# Patient Record
Sex: Male | Born: 1965 | Race: Black or African American | Hispanic: No | Marital: Married | Smoking: Current every day smoker
Health system: Southern US, Community
[De-identification: ages and names within clinical notes are randomized; demographics above are authoritative.]

## PROBLEM LIST (undated history)

## (undated) DIAGNOSIS — I10 Essential (primary) hypertension: Secondary | ICD-10-CM

## (undated) DIAGNOSIS — M1990 Unspecified osteoarthritis, unspecified site: Secondary | ICD-10-CM

## (undated) DIAGNOSIS — T7840XA Allergy, unspecified, initial encounter: Secondary | ICD-10-CM

## (undated) HISTORY — PX: COLON SURGERY: SHX602

## (undated) HISTORY — DX: Allergy, unspecified, initial encounter: T78.40XA

## (undated) HISTORY — DX: Essential (primary) hypertension: I10

## (undated) HISTORY — DX: Unspecified osteoarthritis, unspecified site: M19.90

## (undated) HISTORY — PX: SPINE SURGERY: SHX786

---

## 2017-12-22 DIAGNOSIS — F17210 Nicotine dependence, cigarettes, uncomplicated: Secondary | ICD-10-CM | POA: Insufficient documentation

## 2017-12-22 DIAGNOSIS — R030 Elevated blood-pressure reading, without diagnosis of hypertension: Secondary | ICD-10-CM | POA: Insufficient documentation

## 2017-12-22 DIAGNOSIS — Z981 Arthrodesis status: Secondary | ICD-10-CM | POA: Insufficient documentation

## 2018-01-18 DIAGNOSIS — R29898 Other symptoms and signs involving the musculoskeletal system: Secondary | ICD-10-CM | POA: Insufficient documentation

## 2020-12-09 ENCOUNTER — Encounter: Payer: Self-pay | Admitting: Emergency Medicine

## 2020-12-09 ENCOUNTER — Emergency Department: Payer: Worker's Compensation

## 2020-12-09 ENCOUNTER — Emergency Department
Admission: EM | Admit: 2020-12-09 | Discharge: 2020-12-09 | Disposition: A | Discharge status: Home / Self Care | Payer: Worker's Compensation | Attending: Emergency Medicine | Admitting: Emergency Medicine

## 2020-12-09 ENCOUNTER — Other Ambulatory Visit: Payer: Self-pay

## 2020-12-09 DIAGNOSIS — I10 Essential (primary) hypertension: Secondary | ICD-10-CM | POA: Insufficient documentation

## 2020-12-09 DIAGNOSIS — W19XXXA Unspecified fall, initial encounter: Secondary | ICD-10-CM

## 2020-12-09 DIAGNOSIS — W000XXA Fall on same level due to ice and snow, initial encounter: Secondary | ICD-10-CM | POA: Insufficient documentation

## 2020-12-09 DIAGNOSIS — Y990 Civilian activity done for income or pay: Secondary | ICD-10-CM | POA: Insufficient documentation

## 2020-12-09 DIAGNOSIS — F17210 Nicotine dependence, cigarettes, uncomplicated: Secondary | ICD-10-CM | POA: Insufficient documentation

## 2020-12-09 DIAGNOSIS — S060X9A Concussion with loss of consciousness of unspecified duration, initial encounter: Secondary | ICD-10-CM | POA: Insufficient documentation

## 2020-12-09 IMAGING — CT CT HEAD W/O CM
3 series · 15 of 47 positions shown, 18 images · non-contrast
Comparison: None.

CLINICAL DATA: Neck trauma in a 54-year-old male, fall while at
work and slipped and falling backwards, struck back of head.

EXAM:
CT HEAD WITHOUT CONTRAST
CT CERVICAL SPINE WITHOUT CONTRAST
TECHNIQUE: Multidetector CT imaging of the head and cervical spine was
performed following the standard protocol without intravenous
contrast. Multiplanar CT image reconstructions of the cervical spine
were also generated.

[Series 3: head wo · axial · 0.44mm/px · z∈[-168,-43]mm · 9 of 31 slices shown, 12 images]
[im 3/31  brain]
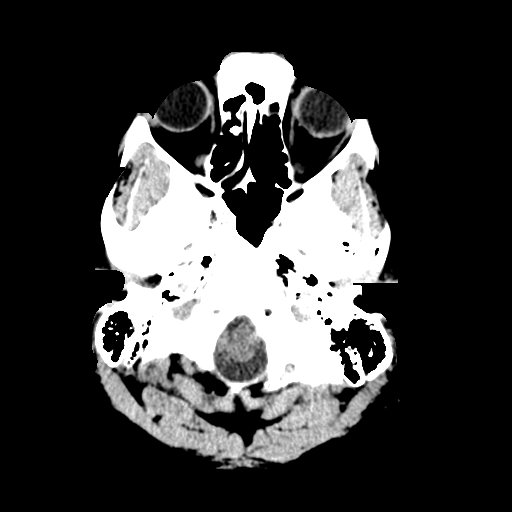
[im 3/31  bone]
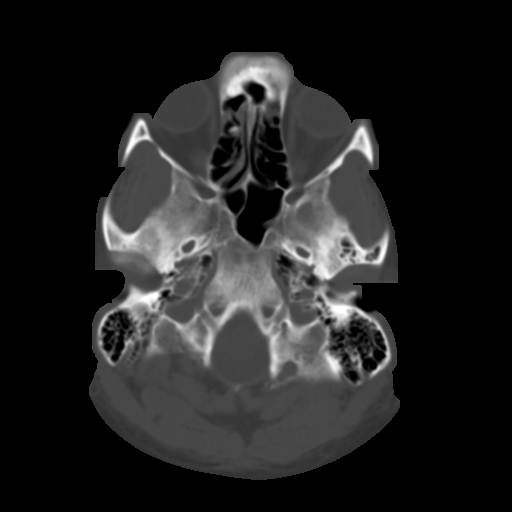
[im 6/31  brain]
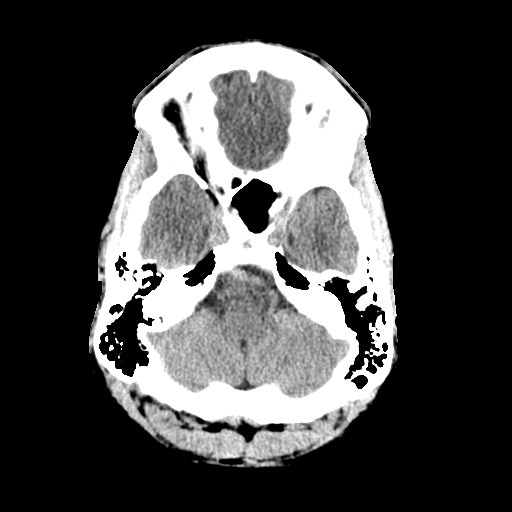
[im 9/31  brain]
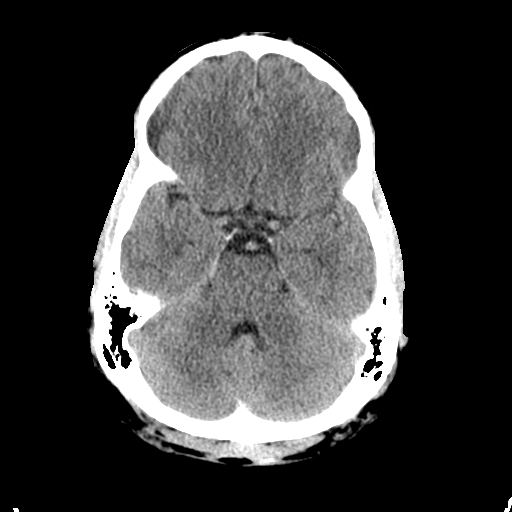
[im 12/31  brain]
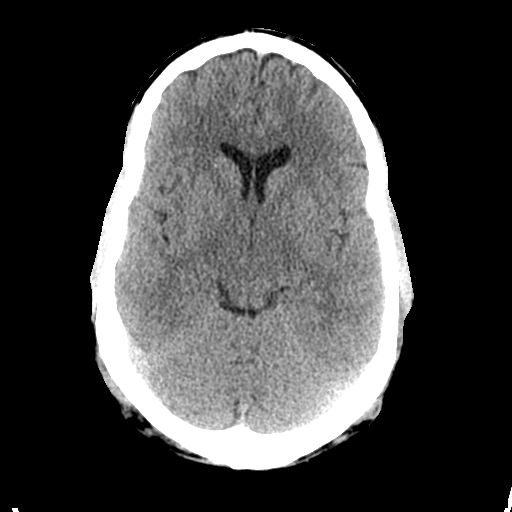
[im 16/31  brain]
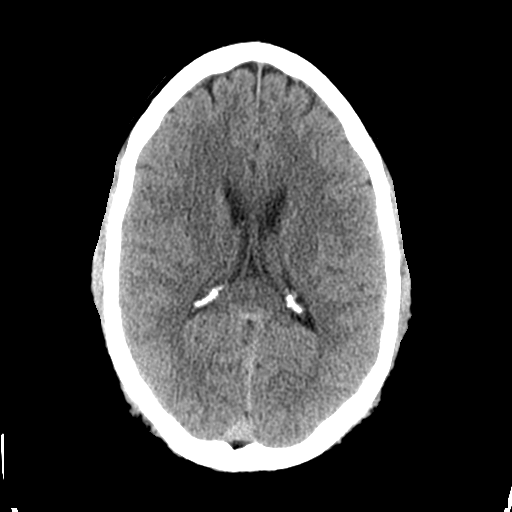
[im 16/31  bone]
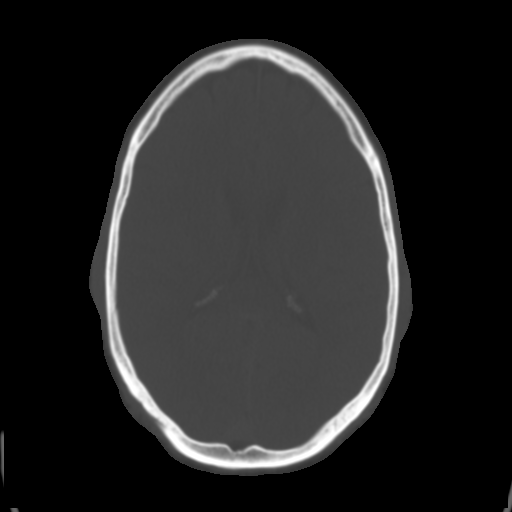
[im 19/31  brain]
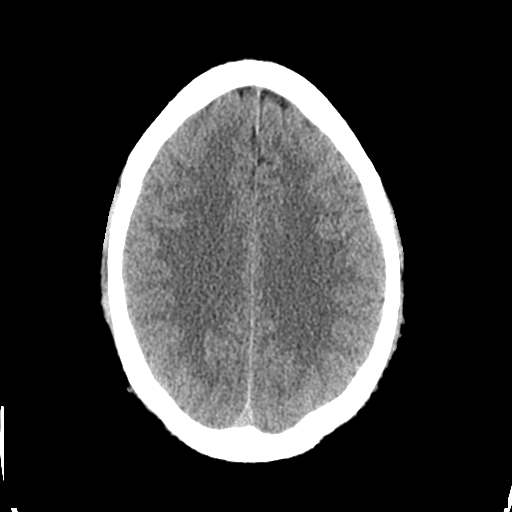
[im 22/31  brain]
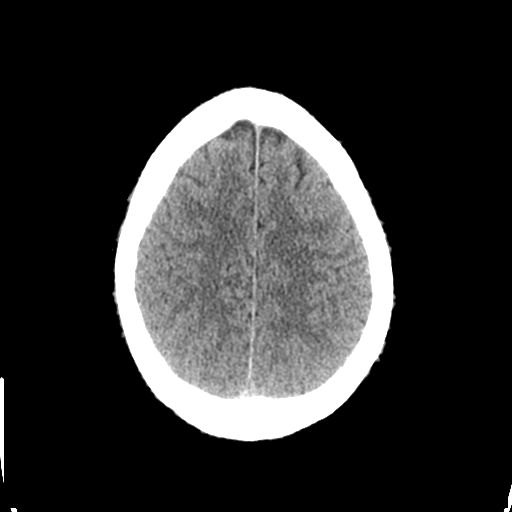
[im 25/31  brain]
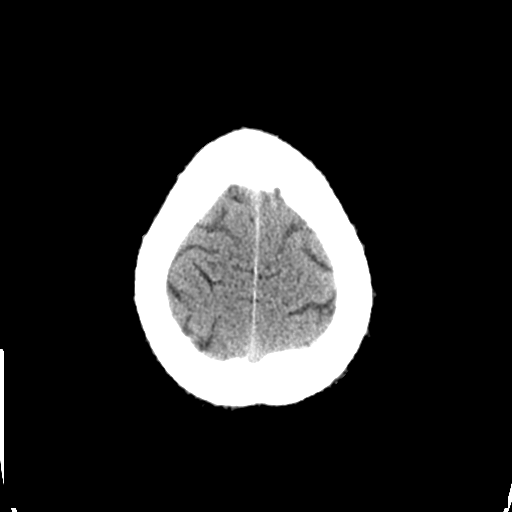
[im 28/31  brain]
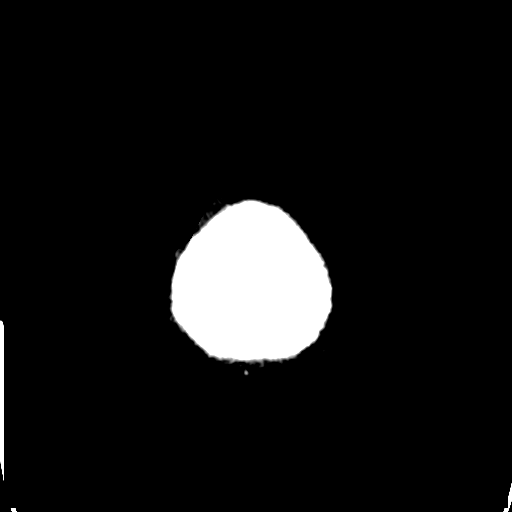
[im 28/31  bone]
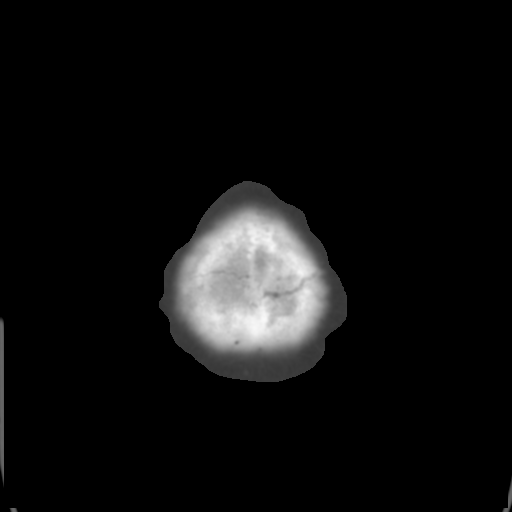

[Series 4: coronal soft tissue · coronal · 0.31mm/px · 3 of 67 slices shown]
[im 23/67  brain]
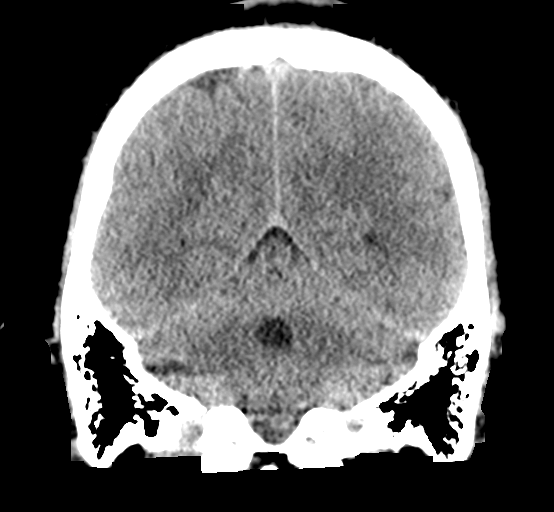
[im 30/67  brain]
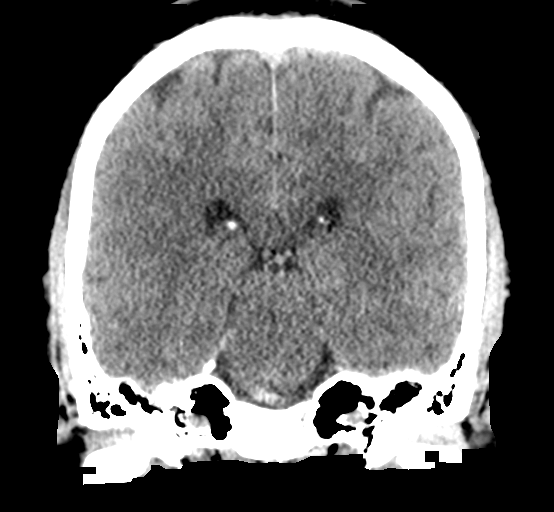
[im 37/67  brain]
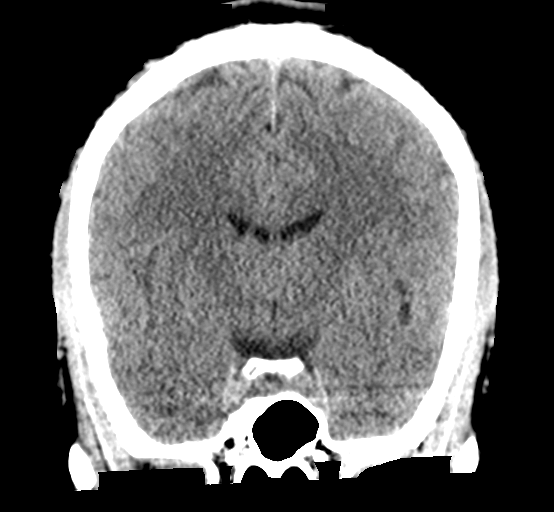

[Series 5: sagittal soft tissue · sagittal · 0.31mm/px · 3 of 51 slices shown]
[im 17/51  brain]
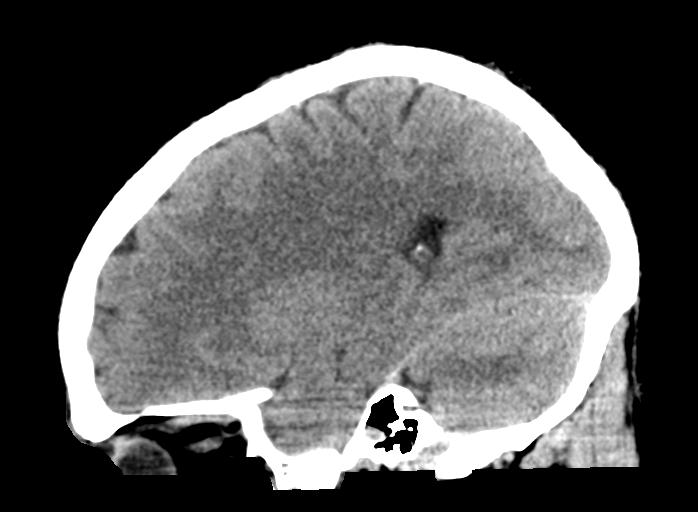
[im 26/51  brain]
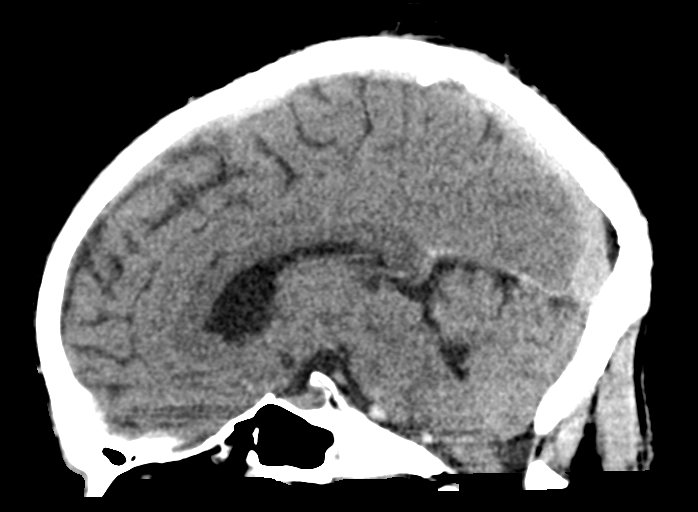
[im 34/51  brain]
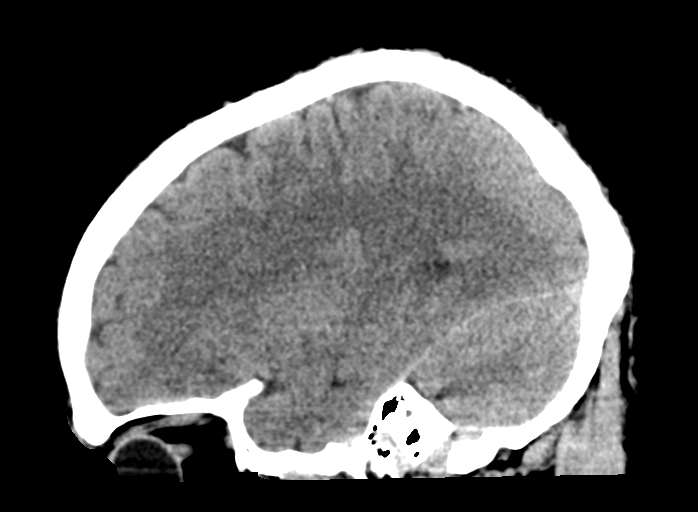

[15 of 47 positions shown; findings below may reference images not displayed]

FINDINGS: CT HEAD FINDINGS

Brain: No evidence of acute infarction, hemorrhage, hydrocephalus,
extra-axial collection or mass lesion/mass effect.

Vascular: No hyperdense vessel or unexpected calcification.

Skull: Normal. Negative for fracture or focal lesion.

Sinuses/Orbits: Visualized paranasal sinuses and orbits are
unremarkable.

Other: None.

CT CERVICAL SPINE FINDINGS

Alignment: Straightening of normal cervical lordotic curvature
likely positional.

Skull base and vertebrae: No acute fracture. No primary bone lesion
or focal pathologic process.

Soft tissues and spinal canal: No prevertebral fluid or swelling. No
visible canal hematoma.

Disc levels: Multilevel degenerative change. Greatest in the mid
cervical spine. Postoperative changes of fusion at C5-6 with
degenerative changes greatest at C3-4, C4-5 and C6-7. Uncovertebral
spurring causes neural foraminal narrowing which is greatest at
C6-7.

Upper chest: Negative.

Other: None
IMPRESSION: 1. No acute intracranial abnormality.
2. No evidence for acute fracture or subluxation of the cervical
spine.
3. Multilevel degenerative change and postoperative fusion of the
cervical spine, as described.

## 2020-12-09 IMAGING — CT CT CERVICAL SPINE W/O CM
3 of 4 series · 12 of 33 positions shown, 14 images · non-contrast
Comparison: None.

CLINICAL DATA: Neck trauma in a 54-year-old male, fall while at
work and slipped and falling backwards, struck back of head.

EXAM:
CT HEAD WITHOUT CONTRAST
CT CERVICAL SPINE WITHOUT CONTRAST
TECHNIQUE: Multidetector CT imaging of the head and cervical spine was
performed following the standard protocol without intravenous
contrast. Multiplanar CT image reconstructions of the cervical spine
were also generated.

[Series 4: sagittal bone · sagittal · 0.26mm/px · 5 of 73 slices shown, 6 images]
[im 25/73  bone]
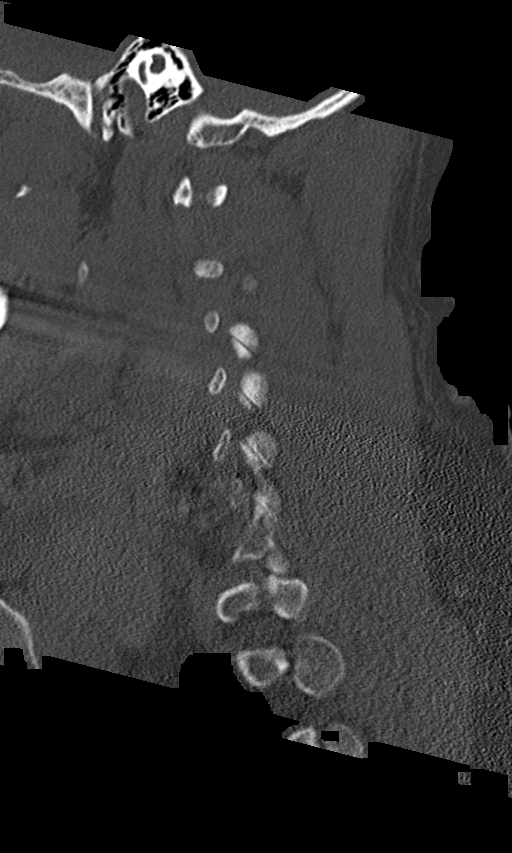
[im 31/73  bone]
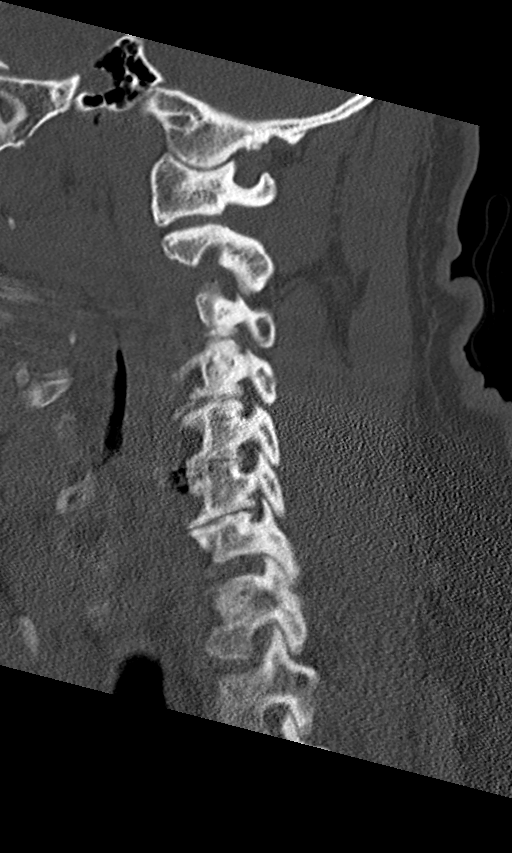
[im 37/73  soft-tissue]
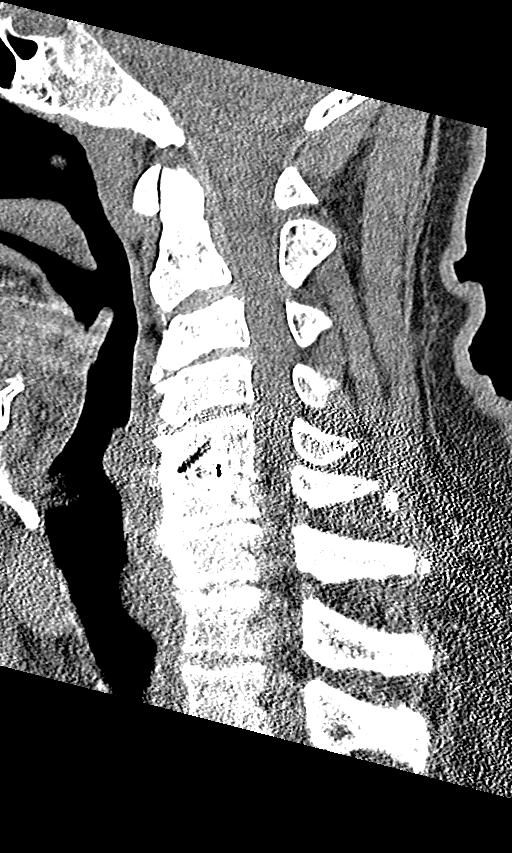
[im 37/73  bone]
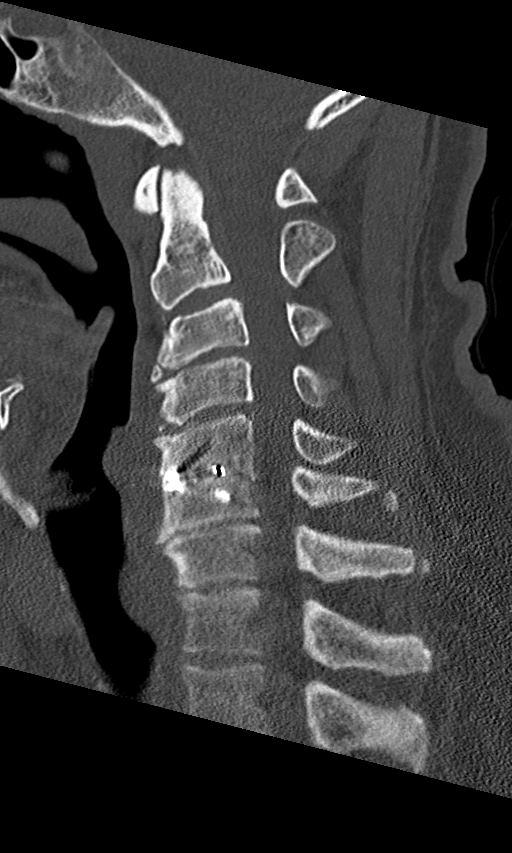
[im 43/73  bone]
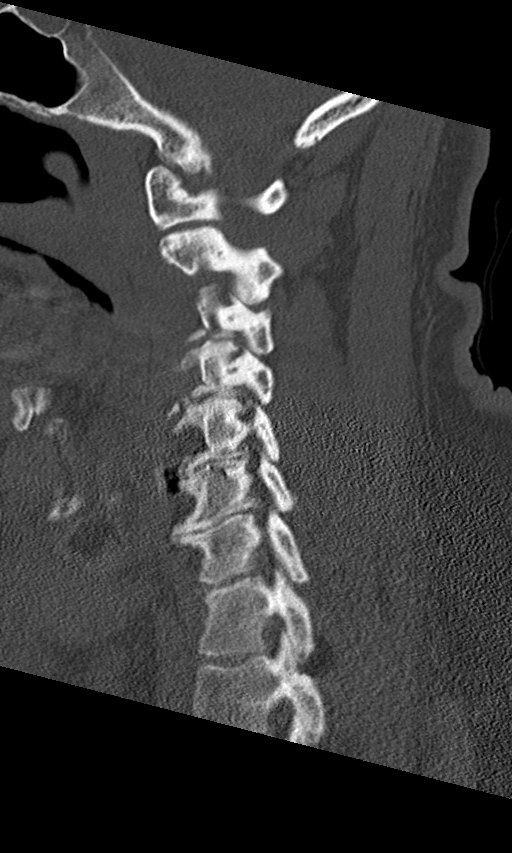
[im 49/73  bone]
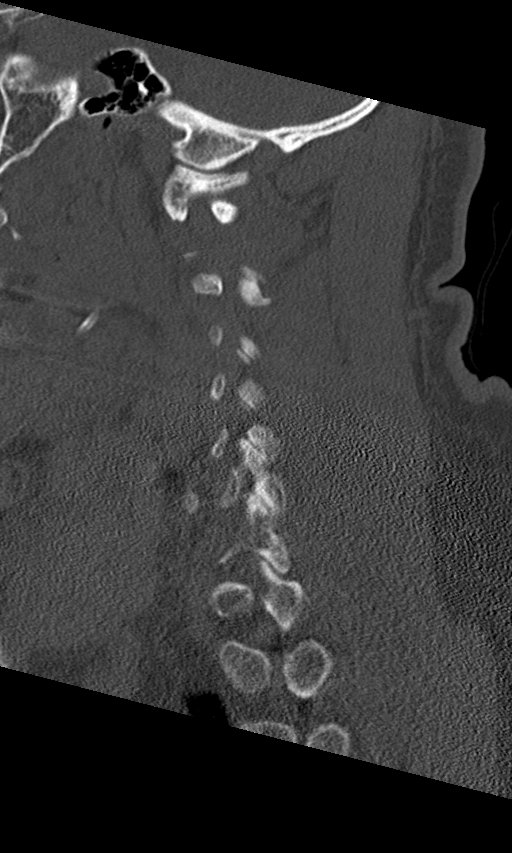

[Series 5: coronal bone · coronal · 0.29mm/px · 3 of 71 slices shown]
[im 15/71  bone]
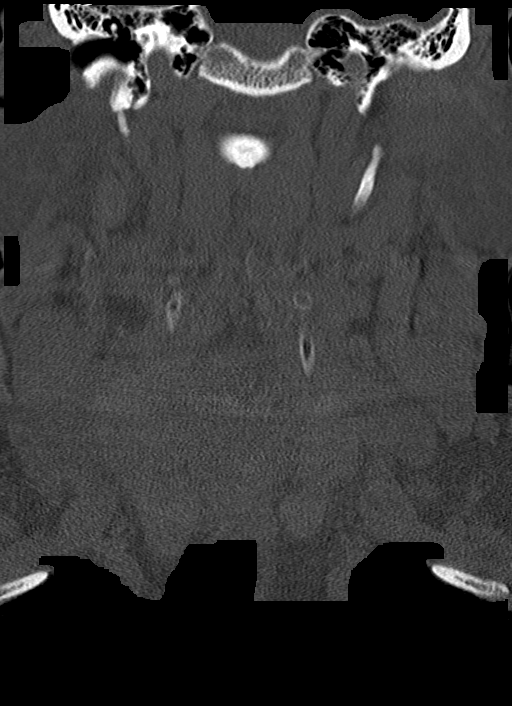
[im 29/71  bone]
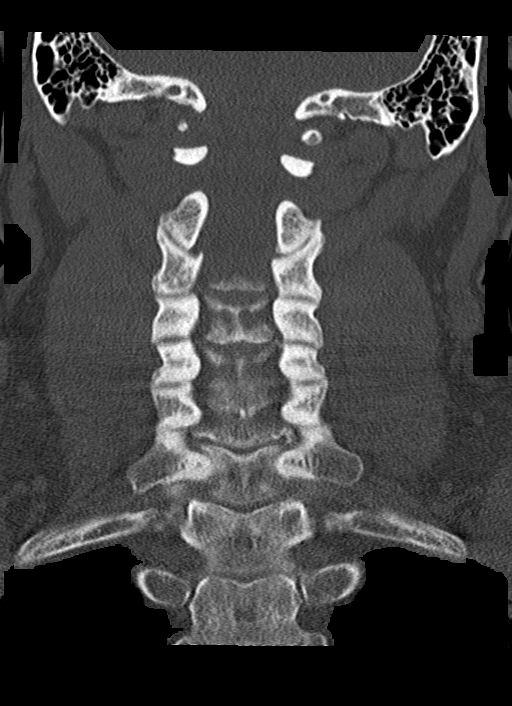
[im 43/71  bone]
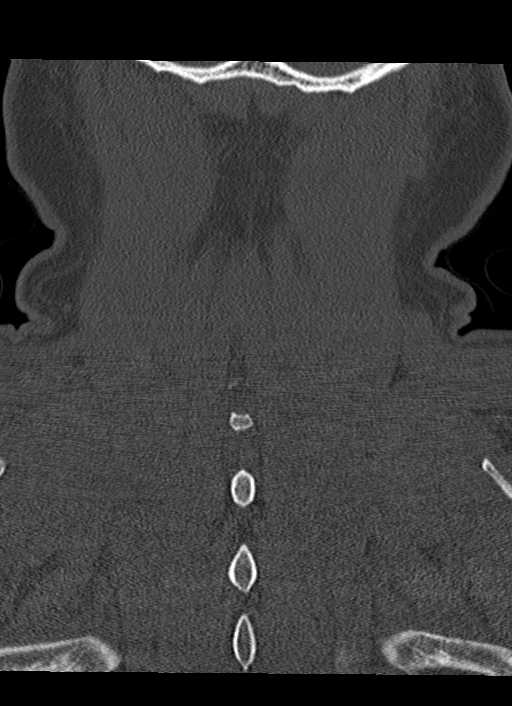

[Series 6: orthogonal bone · axial · 0.29mm/px · z∈[-317,-205]mm · 4 of 91 slices shown, 5 images]
[im 16/91  soft-tissue]
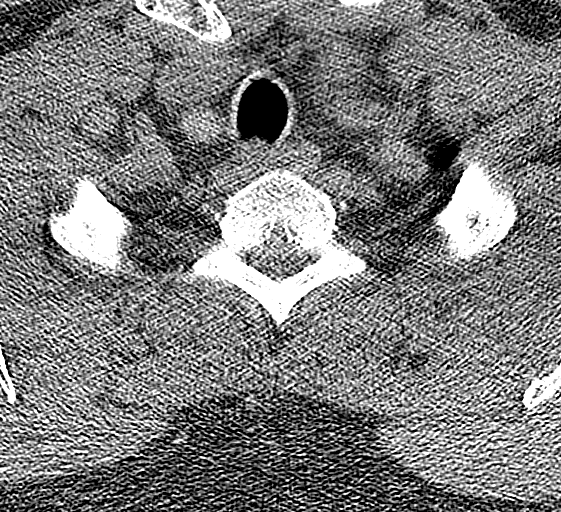
[im 16/91  bone]
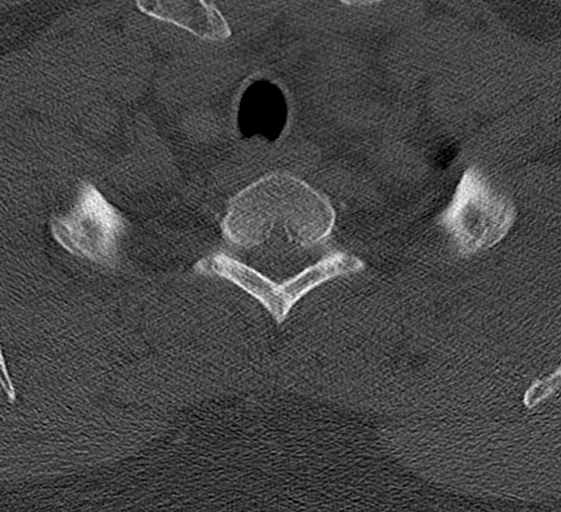
[im 31/91  bone]
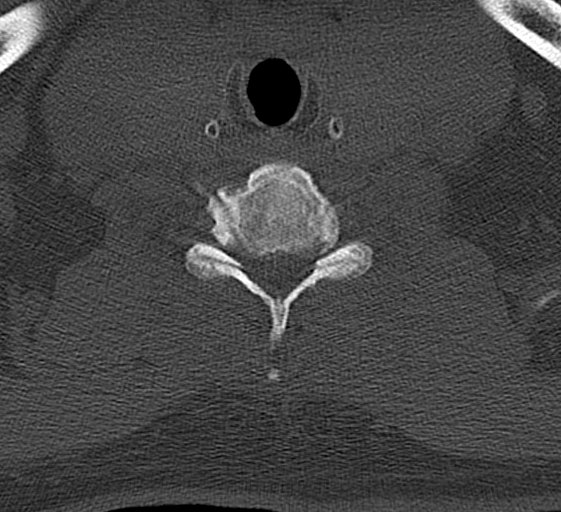
[im 61/91  bone]
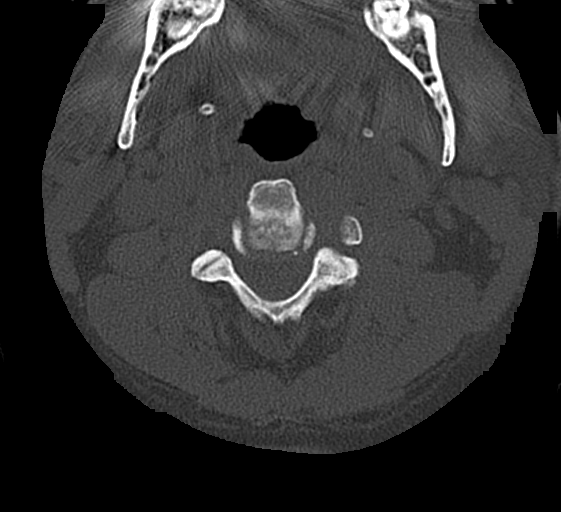
[im 76/91  bone]
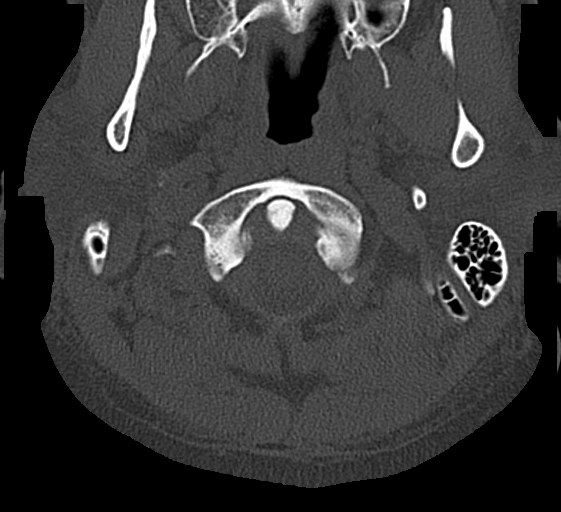

[12 of 33 positions shown; findings below may reference images not displayed]

FINDINGS: CT HEAD FINDINGS

Brain: No evidence of acute infarction, hemorrhage, hydrocephalus,
extra-axial collection or mass lesion/mass effect.

Vascular: No hyperdense vessel or unexpected calcification.

Skull: Normal. Negative for fracture or focal lesion.

Sinuses/Orbits: Visualized paranasal sinuses and orbits are
unremarkable.

Other: None.

CT CERVICAL SPINE FINDINGS

Alignment: Straightening of normal cervical lordotic curvature
likely positional.

Skull base and vertebrae: No acute fracture. No primary bone lesion
or focal pathologic process.

Soft tissues and spinal canal: No prevertebral fluid or swelling. No
visible canal hematoma.

Disc levels: Multilevel degenerative change. Greatest in the mid
cervical spine. Postoperative changes of fusion at C5-6 with
degenerative changes greatest at C3-4, C4-5 and C6-7. Uncovertebral
spurring causes neural foraminal narrowing which is greatest at
C6-7.

Upper chest: Negative.

Other: None
IMPRESSION: 1. No acute intracranial abnormality.
2. No evidence for acute fracture or subluxation of the cervical
spine.
3. Multilevel degenerative change and postoperative fusion of the
cervical spine, as described.

## 2020-12-09 NOTE — Discharge Instructions (Signed)
Please control follow-up appointment with primary care physician for blood pressure recheck in 5 to 7 days.

## 2020-12-09 NOTE — ED Provider Notes (Signed)
[facility] EMERGENCY DEPARTMENT Provider Note   CSN: [id number] Arrival date & time: 12/09/20  1206     History Chief Complaint  Patient presents with  . Fall    Tyler Winters is a 55 y.o. male.  HPI   Patient is a 55 year old male without significant past medical history aside from remote neck surgery several years ago causing chronic weakness in his right shoulder and right arm who presents for assessment after 2 falls that occurred earlier today.  Patient states this time he was walking and slipped on ice falling backwards hitting his head.  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  He denies any acute pain right now and states otherwise he has been in usual state of health without any other recent falls or injuries, chest pain, cough, shortness of breath, fevers, chills, vomiting, diarrhea, dysuria, rash or other acute sick symptoms.  He does not take any blood thinners and does not take any daily medicines.  He is requesting referral for PCP.   History reviewed. No pertinent past medical history.  There are no problems to display for this patient.    No family history on file.  Social History   Tobacco Use  . Smoking status: Current Every Day Smoker    Types: Cigarettes  . Smokeless tobacco: Never Used    Home Medications Prior to Admission medications   Not on File    Allergies    Morphine and related  Review of Systems   Review of Systems  Constitutional: Negative for chills and fever.  HENT: Negative for ear pain and sore throat.   Eyes: Negative for pain and visual disturbance.  Respiratory: Negative for cough and shortness of breath.   Cardiovascular: Negative for chest pain and palpitations.  Gastrointestinal: Negative for abdominal pain and vomiting.   Genitourinary: Negative for dysuria and hematuria.  Musculoskeletal: Negative for arthralgias and back pain.  Skin: Negative for color change and rash.  Neurological: Positive for weakness ( chronic in RUE from remote neck surgery). Negative for seizures and syncope.  All other systems reviewed and are negative.   Physical Exam Updated Vital Signs BP (!) 177/107 (BP Location: Left Arm)   Pulse 85   Temp 98.3 F (36.8 C) (Oral)   Resp 16   Ht 5\' 6"  (1.676 m)   Wt 89.4 kg   SpO2 97%   BMI 31.80 kg/m   Physical Exam Vitals and nursing note reviewed.  Constitutional:      Appearance: He is well-developed and well-nourished.  HENT:     Head: Normocephalic and atraumatic.     Right Ear: External ear normal.     Left Ear: External ear normal.     Nose: Nose normal.     Mouth/Throat:     Mouth: Mucous membranes are moist.  Eyes:     Conjunctiva/sclera: Conjunctivae normal.  Cardiovascular:     Rate and Rhythm: Normal rate and regular rhythm.     Heart sounds: No murmur heard.   Pulmonary:     Effort: Pulmonary effort is normal. No respiratory distress.     Breath sounds: Normal breath sounds.  Abdominal:     Palpations: Abdomen is soft.     Tenderness: There is no abdominal tenderness.  Musculoskeletal:  General: No edema.     Cervical back: Neck supple.  Skin:    General: Skin is warm and dry.     Capillary Refill: Capillary refill takes less than 2 seconds.  Neurological:     Mental Status: He is alert and oriented to person, place, and time.  Psychiatric:        Mood and Affect: Mood and affect and mood normal.    No pronator drift.  No finger dysmetria.  Cranial nerves II to XII grossly intact.  With exception of some weakness in the right arm on flexion extension at the right elbow as well as abduction of the right Shoulder patient has full and symmetric strength in all extremities.    No tenderness step-offs or deformities over the C/T/L-spine.  2+  bilateral radial pulse.   ED Results / Procedures / Treatments   Labs (all labs ordered are listed, but only abnormal results are displayed) Labs Reviewed - No data to display  EKG None  Radiology CT Head Wo Contrast  Result Date: 12/09/2020 CLINICAL DATA:  Neck trauma in a 55 year old male, fall while at work and slipped and falling backwards, struck back of head. EXAM: CT HEAD WITHOUT CONTRAST CT CERVICAL SPINE WITHOUT CONTRAST TECHNIQUE: Multidetector CT imaging of the head and cervical spine was performed following the standard protocol without intravenous contrast. Multiplanar CT image reconstructions of the cervical spine were also generated. COMPARISON:  None. FINDINGS: CT HEAD FINDINGS Brain: No evidence of acute infarction, hemorrhage, hydrocephalus, extra-axial collection or mass lesion/mass effect. Vascular: No hyperdense vessel or unexpected calcification. Skull: Normal. Negative for fracture or focal lesion. Sinuses/Orbits: Visualized paranasal sinuses and orbits are unremarkable. Other: None. CT CERVICAL SPINE FINDINGS Alignment: Straightening of normal cervical lordotic curvature likely positional. Skull base and vertebrae: No acute fracture. No primary bone lesion or focal pathologic process. Soft tissues and spinal canal: No prevertebral fluid or swelling. No visible canal hematoma. Disc levels: Multilevel degenerative change. Greatest in the mid cervical spine. Postoperative changes of fusion at C5-6 with degenerative changes greatest at C3-4, C4-5 and C6-7. Uncovertebral spurring causes neural foraminal narrowing which is greatest at C6-7. Upper chest: Negative. Other: None IMPRESSION: 1. No acute intracranial abnormality. 2. No evidence for acute fracture or subluxation of the cervical spine. 3. Multilevel degenerative change and postoperative fusion of the cervical spine, as described. Electronically Signed   By: Donzetta Kohut M.D.   On: 12/09/2020 13:26   CT Cervical Spine Wo  Contrast  Result Date: 12/09/2020 CLINICAL DATA:  Neck trauma in a 55 year old male, fall while at work and slipped and falling backwards, struck back of head. EXAM: CT HEAD WITHOUT CONTRAST CT CERVICAL SPINE WITHOUT CONTRAST TECHNIQUE: Multidetector CT imaging of the head and cervical spine was performed following the standard protocol without intravenous contrast. Multiplanar CT image reconstructions of the cervical spine were also generated. COMPARISON:  None. FINDINGS: CT HEAD FINDINGS Brain: No evidence of acute infarction, hemorrhage, hydrocephalus, extra-axial collection or mass lesion/mass effect. Vascular: No hyperdense vessel or unexpected calcification. Skull: Normal. Negative for fracture or focal lesion. Sinuses/Orbits: Visualized paranasal sinuses and orbits are unremarkable. Other: None. CT CERVICAL SPINE FINDINGS Alignment: Straightening of normal cervical lordotic curvature likely positional. Skull base and vertebrae: No acute fracture. No primary bone lesion or focal pathologic process. Soft tissues and spinal canal: No prevertebral fluid or swelling. No visible canal hematoma. Disc levels: Multilevel degenerative change. Greatest in the mid cervical spine. Postoperative changes of fusion at C5-6 with degenerative  changes greatest at C3-4, C4-5 and C6-7. Uncovertebral spurring causes neural foraminal narrowing which is greatest at C6-7. Upper chest: Negative. Other: None IMPRESSION: 1. No acute intracranial abnormality. 2. No evidence for acute fracture or subluxation of the cervical spine. 3. Multilevel degenerative change and postoperative fusion of the cervical spine, as described. Electronically Signed   By: Donzetta Kohut M.D.   On: 12/09/2020 13:26    Procedures Procedures (including critical care time)  Medications Ordered in ED Medications - No data to display  ED Course  I have reviewed the triage vital signs and the nursing notes.  Pertinent labs & imaging results that were  available during my care of the patient were reviewed by me and considered in my medical decision making (see chart for details).    MDM Rules/Calculators/A&P                          Patient presents with post history exam for assessment after 2 falls that occurred after he slipped on some ice earlier today.  He also states he has some memory loss after the second fall is not sure how he got back inside although denies any acute symptoms.  On arrival he is hypertensive with BP of 177/107 otherwise stable vital signs on room air.  He denies any known history of hypertension.  On exam patient is neurovascularly intact in all extremities with exception of some weakness in his right upper extremity which patient states is chronic.  No other neurodeficits or other signs of trauma to the patient's face, scalp, chest, abdomen, back or otherwise.  CT head and C-spine are unremarkable for evidence of fracture dislocation or intracranial large.  Given patient is asymptomatic with otherwise reassuring work-up and exam I believe he is safe for discharge plan for outpatient follow-up.  Advised patient that his blood pressure was elevated today and should be rechecked in 5 to 7 days.  Advised to establish PCP care and referral was placed.  Discharged stable condition.  Strict return precautions advised and discussed.     Final Clinical Impression(s) / ED Diagnoses Final diagnoses:  Fall, initial encounter  Concussion with loss of consciousness, initial encounter  Hypertension, unspecified type    Rx / DC Orders ED Discharge Orders    None       Gilles Chiquito, MD 12/09/20 [phone]

## 2020-12-09 NOTE — ED Triage Notes (Signed)
While at work slipped on ice, fell back hit head.  Stood up, slipped again and fell back.  States can't remember how he got back inside.  Also states that 'for a while' he has felt off balance.  Patient is AAOx3.  Skin warm and dry. NAD. MAE equally and strong.    Works for [employer].

## 2021-01-08 ENCOUNTER — Other Ambulatory Visit: Payer: Self-pay

## 2021-01-08 ENCOUNTER — Inpatient Hospital Stay
Admission: EM | Admit: 2021-01-08 | Discharge: 2021-01-11 | DRG: 552 | Disposition: A | Discharge status: Home / Self Care | Payer: Self-pay | Attending: Internal Medicine | Admitting: Internal Medicine

## 2021-01-08 DIAGNOSIS — R3915 Urgency of urination: Secondary | ICD-10-CM | POA: Diagnosis present

## 2021-01-08 DIAGNOSIS — R292 Abnormal reflex: Secondary | ICD-10-CM | POA: Diagnosis present

## 2021-01-08 DIAGNOSIS — F17200 Nicotine dependence, unspecified, uncomplicated: Secondary | ICD-10-CM | POA: Diagnosis present

## 2021-01-08 DIAGNOSIS — Z981 Arthrodesis status: Secondary | ICD-10-CM

## 2021-01-08 DIAGNOSIS — Z8249 Family history of ischemic heart disease and other diseases of the circulatory system: Secondary | ICD-10-CM

## 2021-01-08 DIAGNOSIS — M47812 Spondylosis without myelopathy or radiculopathy, cervical region: Secondary | ICD-10-CM | POA: Diagnosis present

## 2021-01-08 DIAGNOSIS — R279 Unspecified lack of coordination: Secondary | ICD-10-CM | POA: Diagnosis present

## 2021-01-08 DIAGNOSIS — F17210 Nicotine dependence, cigarettes, uncomplicated: Secondary | ICD-10-CM | POA: Diagnosis present

## 2021-01-08 DIAGNOSIS — Z9181 History of falling: Secondary | ICD-10-CM

## 2021-01-08 DIAGNOSIS — Z20822 Contact with and (suspected) exposure to covid-19: Secondary | ICD-10-CM | POA: Diagnosis present

## 2021-01-08 DIAGNOSIS — R29898 Other symptoms and signs involving the musculoskeletal system: Secondary | ICD-10-CM | POA: Diagnosis present

## 2021-01-08 DIAGNOSIS — M4802 Spinal stenosis, cervical region: Secondary | ICD-10-CM | POA: Diagnosis present

## 2021-01-08 DIAGNOSIS — M5116 Intervertebral disc disorders with radiculopathy, lumbar region: Principal | ICD-10-CM | POA: Diagnosis present

## 2021-01-08 DIAGNOSIS — M47814 Spondylosis without myelopathy or radiculopathy, thoracic region: Secondary | ICD-10-CM | POA: Diagnosis present

## 2021-01-08 DIAGNOSIS — R262 Difficulty in walking, not elsewhere classified: Secondary | ICD-10-CM | POA: Diagnosis present

## 2021-01-08 DIAGNOSIS — M48061 Spinal stenosis, lumbar region without neurogenic claudication: Secondary | ICD-10-CM | POA: Diagnosis present

## 2021-01-08 LAB — BASIC METABOLIC PANEL
Anion gap: 8 (ref 5–15)
BUN: 12 mg/dL (ref 6–20)
CO2: 24 mmol/L (ref 22–32)
Calcium: 8.9 mg/dL (ref 8.9–10.3)
Chloride: 109 mmol/L (ref 98–111)
Creatinine, Ser: 0.87 mg/dL (ref 0.61–1.24)
GFR, Estimated: >60 mL/min (ref >60)
Glucose, Bld: 99 mg/dL (ref 70–99)
Potassium: 3.9 mmol/L (ref 3.5–5.1)
Sodium: 141 mmol/L (ref 135–145)

## 2021-01-08 LAB — CBC
HCT: 43.5 % (ref 39.0–52.0)
Hemoglobin: 14.4 g/dL (ref 13.0–17.0)
MCH: 29.7 pg (ref 26.0–34.0)
MCHC: 33.1 g/dL (ref 30.0–36.0)
MCV: 89.7 fL (ref 80.0–100.0)
Platelets: 252 10*3/uL (ref 150–400)
RBC: 4.85 MIL/uL (ref 4.22–5.81)
RDW: 12.9 % (ref 11.5–15.5)
WBC: 4.6 10*3/uL (ref 4.0–10.5)
nRBC: 0 % (ref 0.0–0.2)

## 2021-01-08 NOTE — ED Triage Notes (Signed)
First Nurse: Pt states about a month ago coming in for a fall and being diagnosed with a concussion. Pt states he now has a "pimp" walk to the left leg, with left leg weakness. Pt has no other complaints. Dr. Vicente Males made aware. No orders given for emergent imaging at this time. Pt was able to walk to the First nurse desk.

## 2021-01-08 NOTE — ED Notes (Signed)
Pt ambulatory from waiting room to ED room 6

## 2021-01-08 NOTE — ED Triage Notes (Signed)
Pt states that he had a fall about a month ago and since then he has had gradual numbness of his left leg. Pt is able to walk, with a limp. Pt has no other complaints.  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   Dr. Vicente Males notified and stated pt is okay for flex. Gave verbal for blood work, please see orders

## 2021-01-09 ENCOUNTER — Encounter: Payer: Self-pay | Admitting: Internal Medicine

## 2021-01-09 ENCOUNTER — Emergency Department: Payer: Self-pay

## 2021-01-09 ENCOUNTER — Inpatient Hospital Stay: Payer: Self-pay

## 2021-01-09 DIAGNOSIS — F17200 Nicotine dependence, unspecified, uncomplicated: Secondary | ICD-10-CM | POA: Diagnosis present

## 2021-01-09 DIAGNOSIS — R29898 Other symptoms and signs involving the musculoskeletal system: Secondary | ICD-10-CM | POA: Insufficient documentation

## 2021-01-09 LAB — RESP PANEL BY RT-PCR (FLU A&B, COVID) ARPGX2
Influenza A by PCR: NEGATIVE
Influenza B by PCR: NEGATIVE
SARS Coronavirus 2 by RT PCR: NEGATIVE

## 2021-01-09 IMAGING — MR MR THORACIC SPINE W/O CM
6 series · 30 of 48 positions shown · non-contrast
Comparison: CTs of the cervical, thoracic and lumbar spine same
date.

CLINICAL DATA: Left leg weakness following a fall with loss of
consciousness approximately 4 weeks prior. Unsteady gait. Evaluate
for cord compression and cauda equina syndrome.

EXAM:
MRI CERVICAL, THORACIC AND LUMBAR SPINE WITHOUT CONTRAST
TECHNIQUE: Multiplanar and multiecho pulse sequences of the cervical spine, to
include the craniocervical junction and cervicothoracic junction,
and thoracic and lumbar spine, were obtained without intravenous
contrast.

[Series 18: T1 · sagittal · 6.0mm · 1.41mm/px · 2 of 9 slices shown (1 of 2)]
[im 1/9]
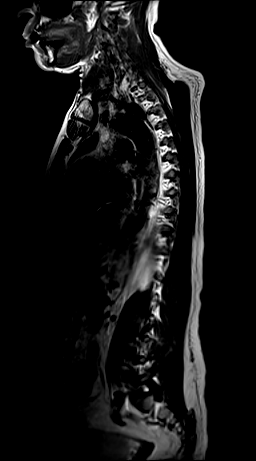
[im 9/9]
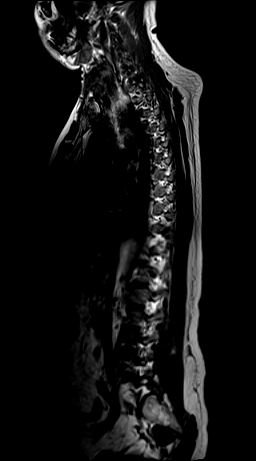

[Series 19: T2 · sagittal · 3.0mm · 1.33mm/px · 6 of 17 slices shown (1 of 2)]
[im 1/17]
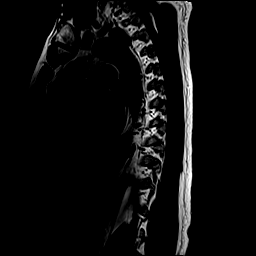
[im 4/17]
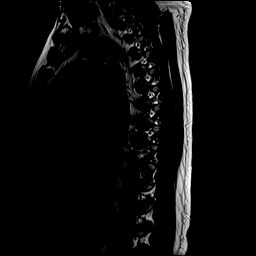
[im 7/17]
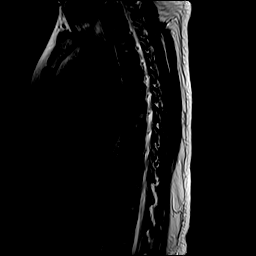
[im 10/17]
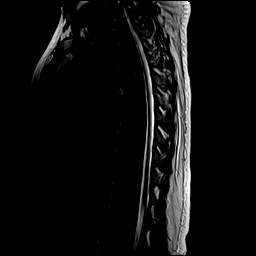
[im 13/17]
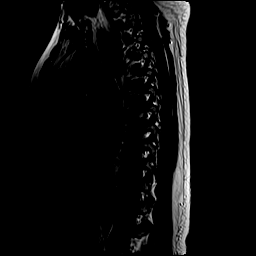
[im 17/17]
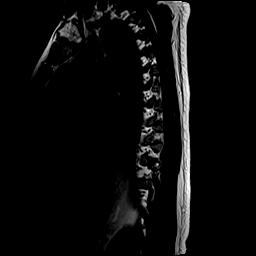

[Series 20: T1 · sagittal · 3.0mm · 1.33mm/px · 6 of 17 slices shown (2 of 2)]
[im 1/17]
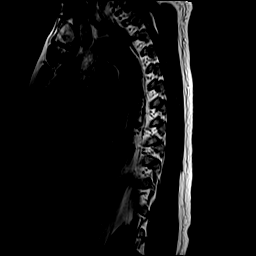
[im 4/17]
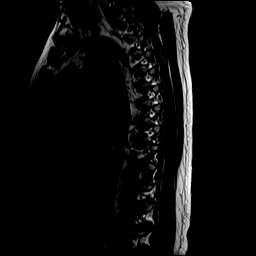
[im 7/17]
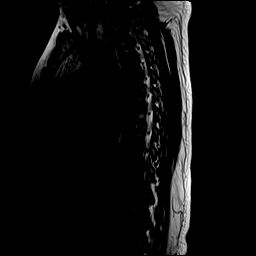
[im 10/17]
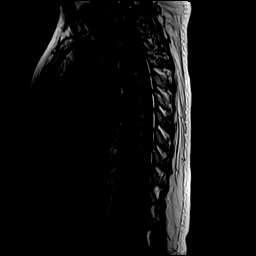
[im 13/17]
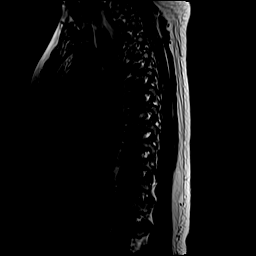
[im 17/17]
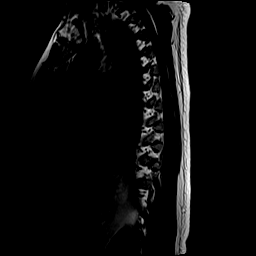

[Series 21: STIR · sagittal · 3.0mm · 0.66mm/px · 6 of 17 slices shown]
[im 1/17]
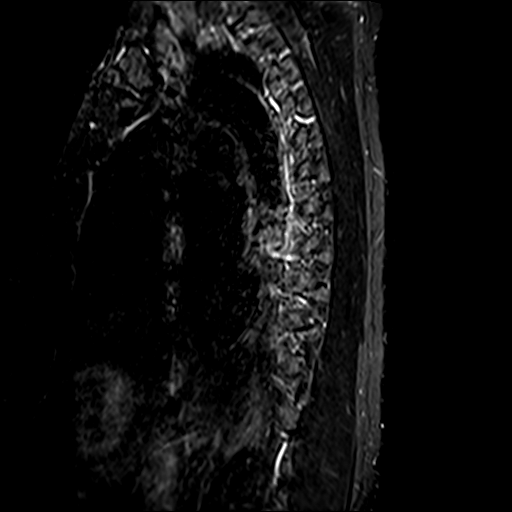
[im 4/17]
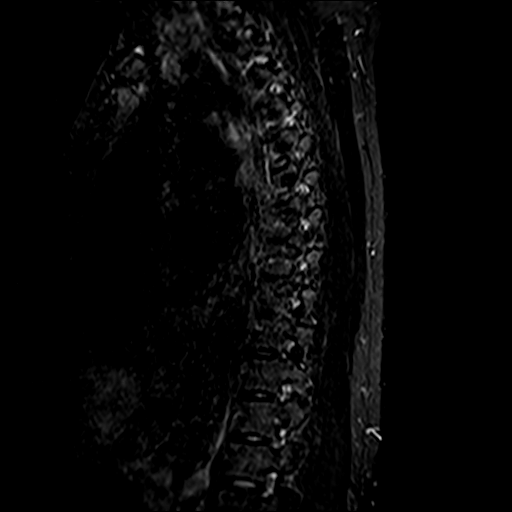
[im 7/17]
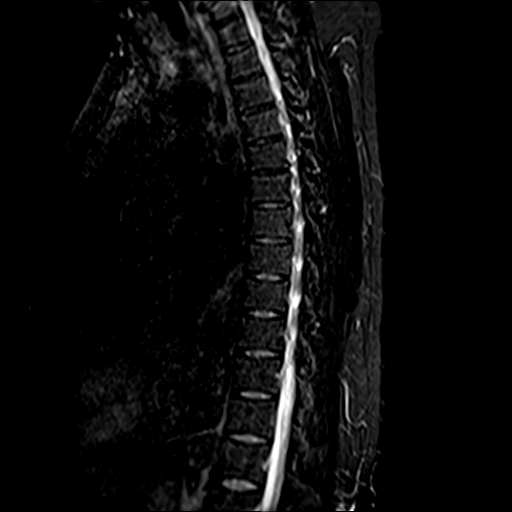
[im 10/17]
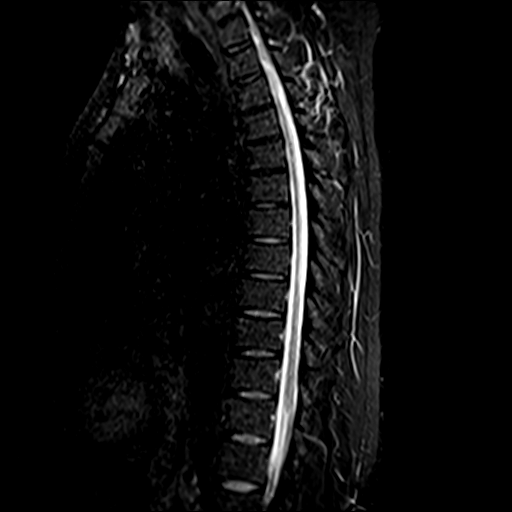
[im 13/17]
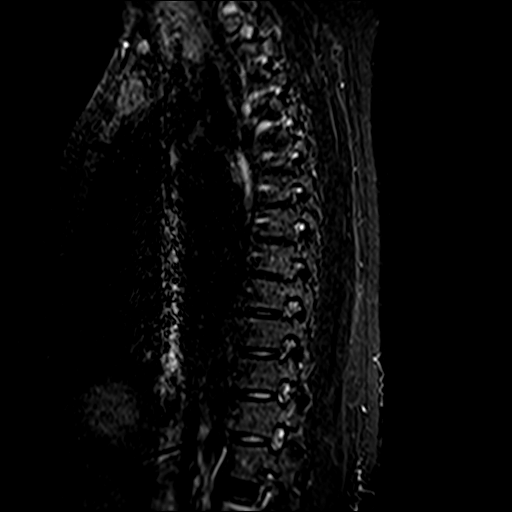
[im 17/17]
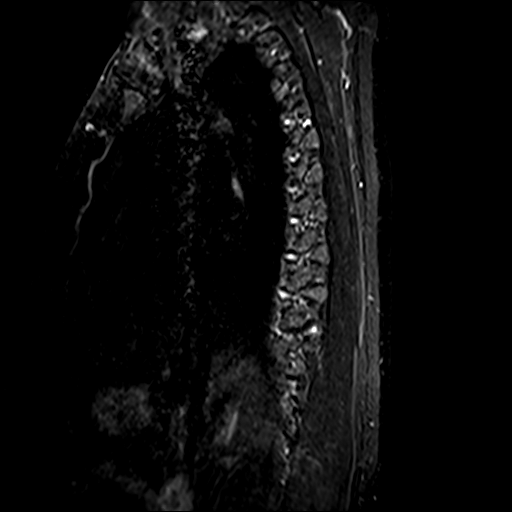

[Series 22: T2 · axial · 4.0mm · 0.59mm/px · z∈[-285,-34]mm · 8 of 39 slices shown (2 of 2)]
[im 1/39]
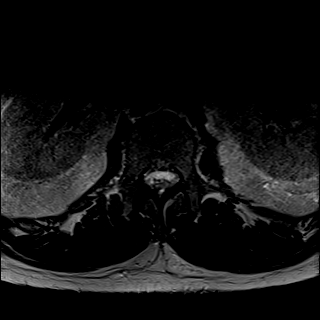
[im 6/39]
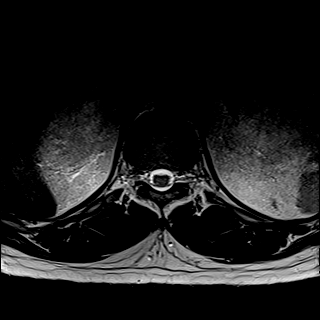
[im 12/39]
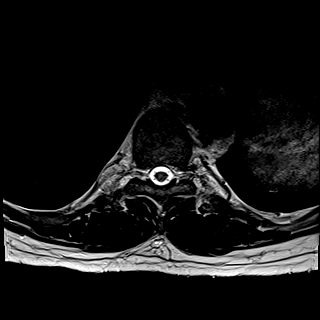
[im 18/39]
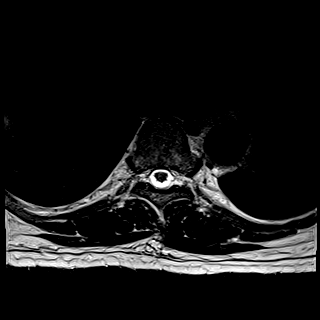
[im 21/39]
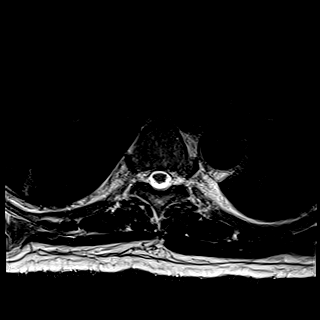
[im 27/39]
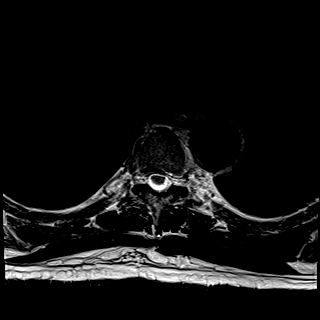
[im 33/39]
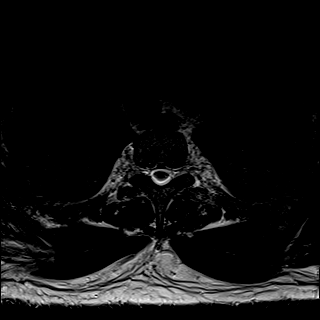
[im 39/39]
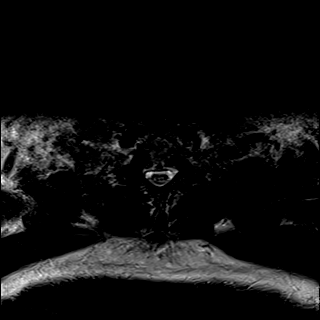

[Series 23: GRE · axial · 4.0mm · 0.37mm/px · z∈[-285,-244]mm · 2 of 39 slices shown]
[im 1/39]
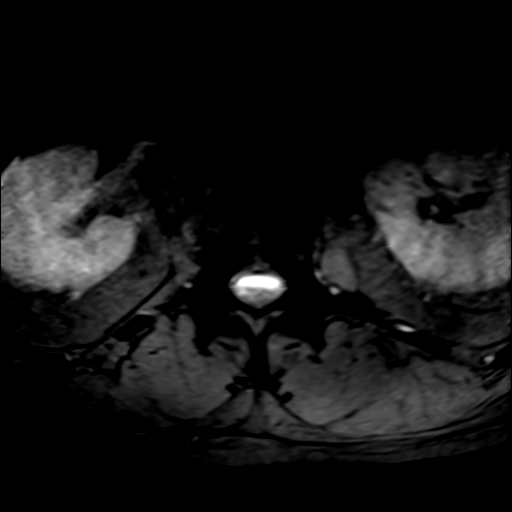
[im 6/39]
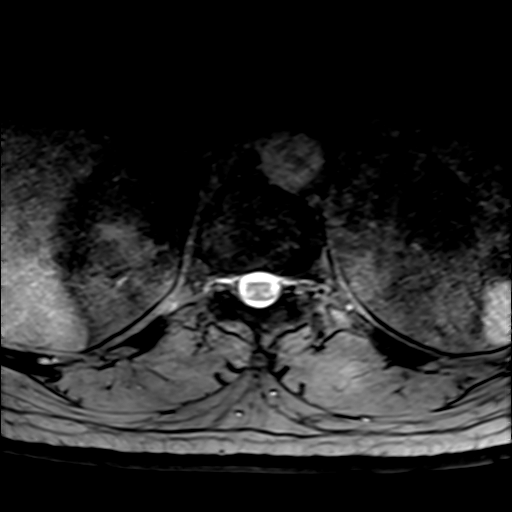

[30 of 48 positions shown; findings below may reference images not displayed]

FINDINGS: MRI CERVICAL SPINE FINDINGS

Alignment: Straightening without focal angulation or listhesis.

Vertebrae: No evidence of acute fracture or focal osseous lesion.
Previous anterior discectomy and fusion at C5-6 with solid interbody
fusion as correlated with preceding CT.

Cord: There are symmetric foci of T2 hyperintensity bilaterally in
the cord at C5, best seen on axial image [DATE]. There is no focal
cord expansion or atrophy at this level. No other cord signal
abnormalities are identified. Given proximity to the prior cervical
fusion, this is likely the sequela of remote compressive myelopathy.

Posterior Fossa, vertebral arteries, paraspinal tissues: The
craniocervical junction appears normal. There are bilateral
vertebral artery flow voids. No paraspinal abnormalities.

Disc levels:

C2-3: Mild disc bulging and uncinate spurring. Mild foraminal
narrowing bilaterally. No cord deformity.

C3-4: Loss of disc height with disc bulging, uncinate spurring and
facet hypertrophy bilaterally. Moderate biforaminal narrowing. No
cord deformity.

C4-5: Loss of disc height with disc bulging, uncinate spurring and
bilateral facet hypertrophy. The CSF surrounding the cord is effaced
with narrowing the AP diameter of the canal to 7 mm. As above, just
below the disc space, there is symmetric T2 hyperintensity in the
cord bilaterally. Moderate to severe foraminal narrowing
bilaterally.

C5-6: Status post anterior discectomy and fusion. Residual uncinate
spurring contributes to moderate foraminal narrowing bilaterally. No
cord deformity.

C6-7: Loss of disc height with disc bulging, uncinate spurring and
bilateral facet hypertrophy. Moderate to severe osseous foraminal
narrowing bilaterally. No cord deformity.

C7-T1: No significant findings.

MRI THORACIC SPINE FINDINGS

Alignment:  Physiologic.

Vertebrae: No acute or suspicious osseous findings.

Cord:  The thoracic cord appears normal in signal and caliber.

Paraspinal and other soft tissues: No significant paraspinal
abnormalities.

Disc levels:

Mild thoracic spine degenerative changes. There is a small central
disc protrusion at T3-4 without cord deformity. There is a small
left paracentral disc protrusion at T4-5. No other significant disc
space findings are present. The neural foramina appear patent. No
cord deformity.

MRI LUMBAR SPINE FINDINGS

Segmentation: There are 5 lumbar type vertebral bodies.

Alignment:  Straightening without focal angulation or listhesis.

Vertebrae: No worrisome osseous lesion, acute fracture or pars
defect. The visualized sacroiliac joints appear unremarkable.

Conus medullaris: Extends to the L1 level and appears normal.

Paraspinal and other soft tissues: No significant paraspinal
findings.

Disc levels:

No significant disc space findings from T12-L1 through L2-3.

L3-4: Mild disc bulging and mild right foraminal narrowing. No nerve
root encroachment.

L4-5: Loss of disc height with disc desiccation and vacuum
phenomenon as correlated with prior CT. There is annular disc
bulging and a moderate-sized left paracentral disc protrusion. There
is mass effect on the thecal sac and left lateral recess with
possible left L5 nerve root encroachment. The right lateral recess
and both foramina are mildly narrowed.

L5-S1: Disc height and hydration are maintained. Mild bilateral
facet hypertrophy without resulting spinal stenosis or nerve root
encroachment.
IMPRESSION: 1. No acute osseous or ligamentous findings in the spine. No clear
explanation for the patient's symptoms.
2. Symmetric foci of T2 hyperintensity in the cord at C5. Given
proximity to the prior cervical fusion, this is likely the sequela
of remote compressive myelopathy. No cord edema or expansion. The
conus medullaris and cauda equina appear normal.
3. Multilevel cervical spondylosis post anterior discectomy and
fusion at C5-6. Mild spinal stenosis at C4-5. Multilevel cervical
foraminal narrowing as detailed above.
4. Mild thoracic spine degenerative changes with small disc
protrusions at T3-4 and T4-5. No cord deformity or foraminal
compromise.
5. Moderate-sized left paracentral disc protrusion at L4-5 with mass
effect on the thecal sac and possible left L5 nerve root
encroachment.

## 2021-01-09 IMAGING — CT CT CERVICAL SPINE W/O CM
3 of 4 series · 12 of 33 positions shown, 14 images · non-contrast
Comparison: CT cervical spine [DATE].

CLINICAL DATA: about a month ago coming in for a fall and being
diagnosed with a concussion. Pt states he now has a "pimp" walk to
the left leg, with left leg weakness.

EXAM:
CT CERVICAL, THORACIC, AND LUMBAR SPINE WITHOUT CONTRAST
TECHNIQUE: Multidetector CT imaging of the cervical, thoracic and lumbar spine
was performed without intravenous contrast. Multiplanar CT image
reconstructions were also generated.

[Series 4: sagittal bone · sagittal · 0.28mm/px · 5 of 80 slices shown, 6 images]
[im 27/80  bone]
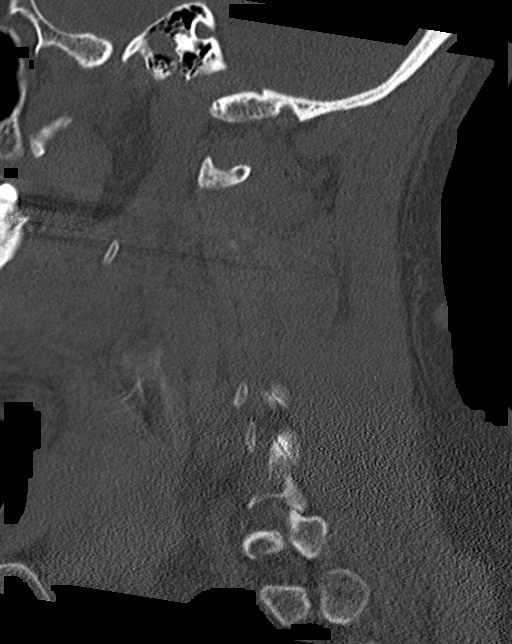
[im 33/80  bone]
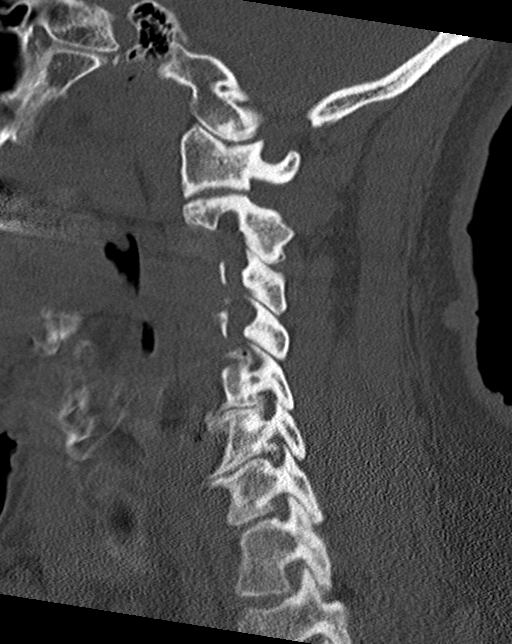
[im 40/80  soft-tissue]
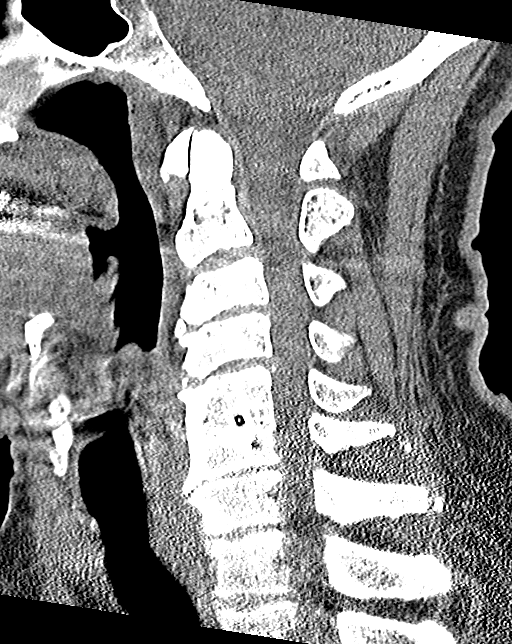
[im 40/80  bone]
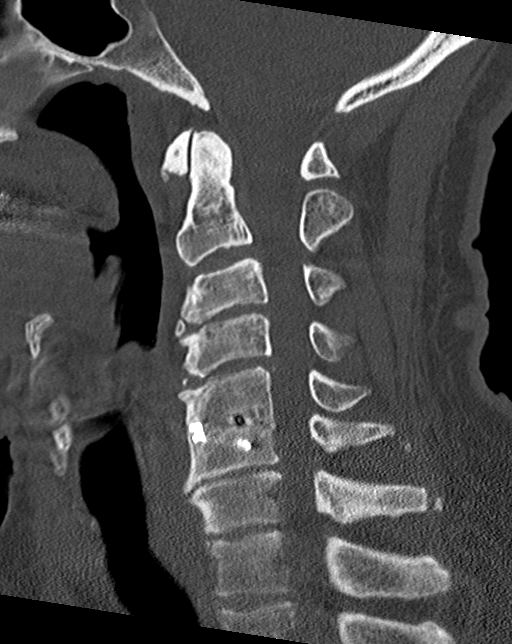
[im 47/80  bone]
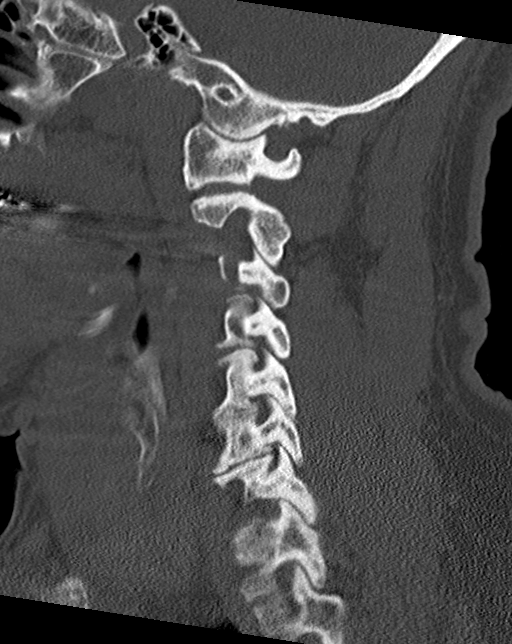
[im 53/80  bone]
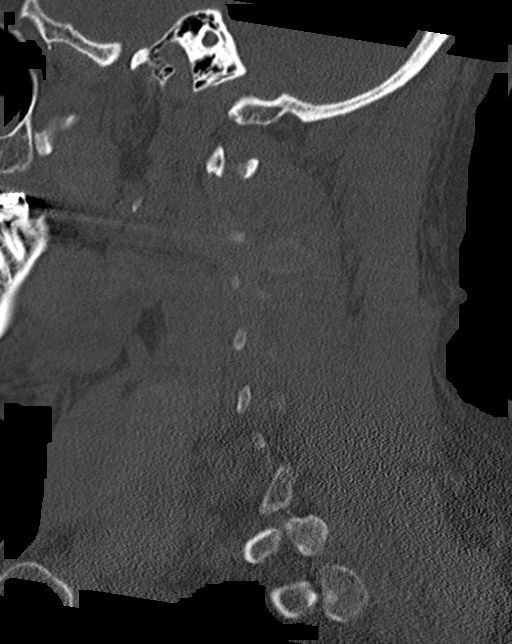

[Series 5: coronal bone · coronal · 0.31mm/px · 3 of 65 slices shown]
[im 13/65  bone]
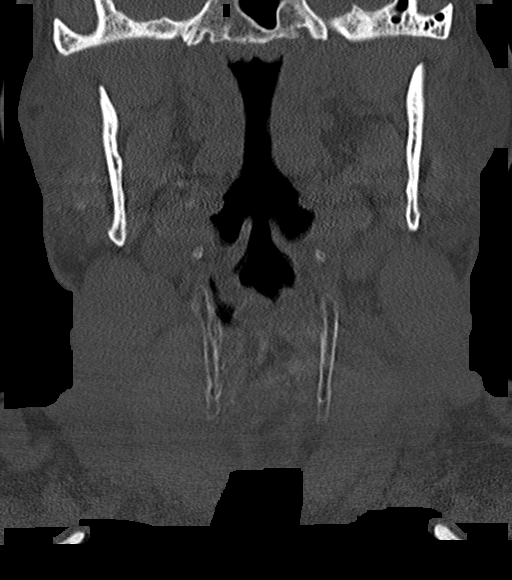
[im 26/65  bone]
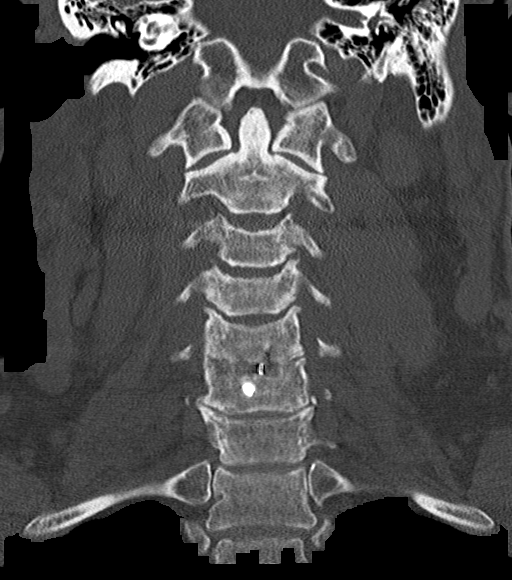
[im 39/65  bone]
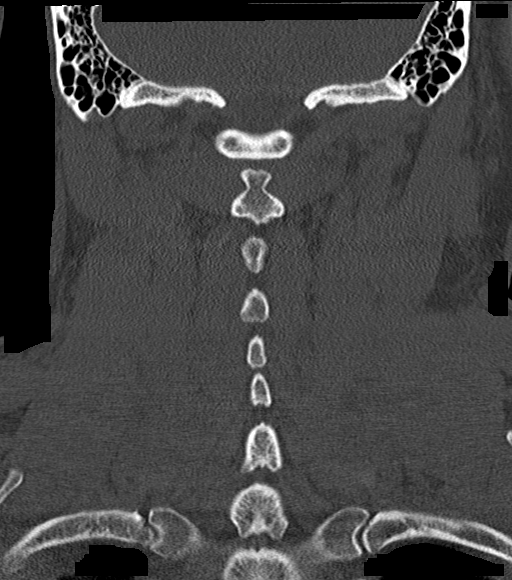

[Series 6: orthogonal bone · axial · 0.27mm/px · z∈[-209,-92]mm · 4 of 91 slices shown, 5 images]
[im 16/91  soft-tissue]
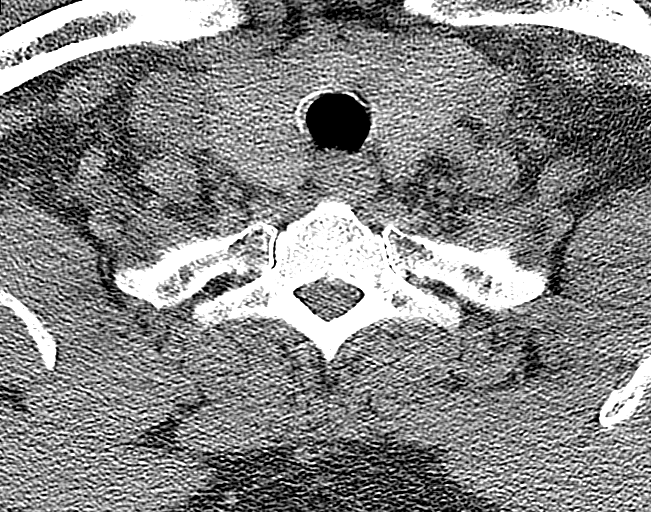
[im 16/91  bone]
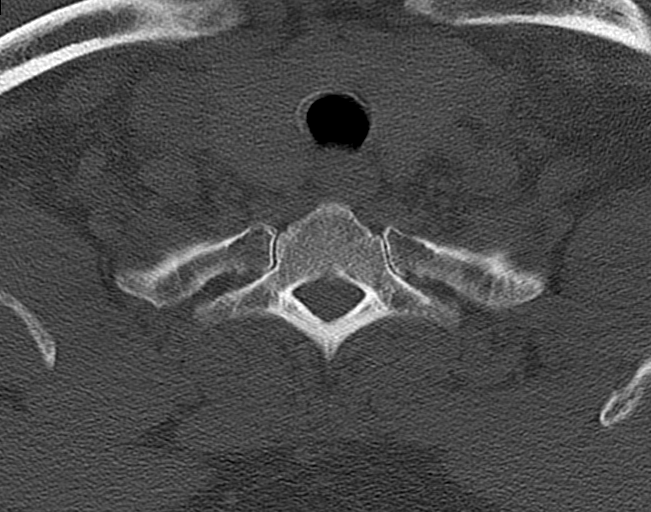
[im 31/91  bone]
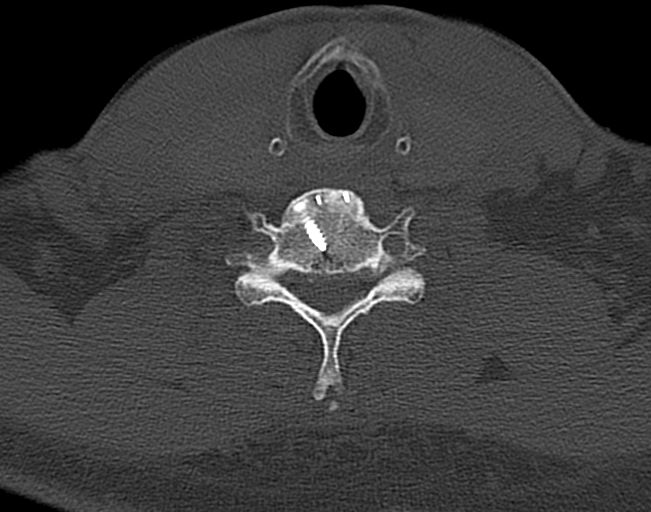
[im 61/91  bone]
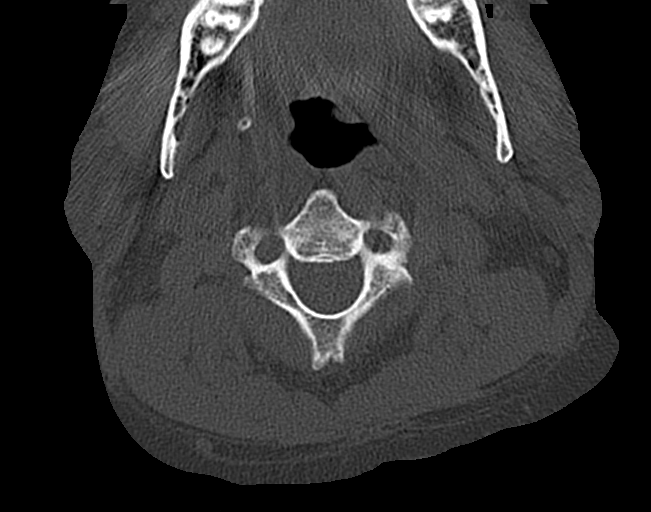
[im 76/91  bone]
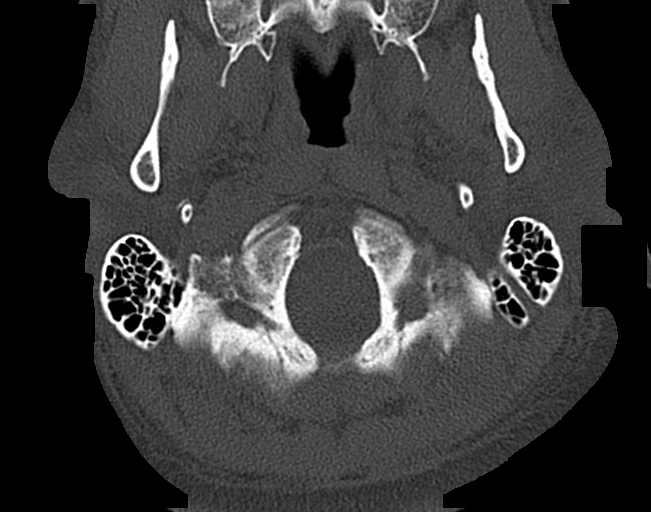

[12 of 33 positions shown; findings below may reference images not displayed]

FINDINGS: CT CERVICAL SPINE FINDINGS

Alignment: Straightening of the normal cervical lordosis likely due
to positioning and degenerative changes as well as surgical changes.
Otherwise normal.

Skull base and vertebrae: Anterior disc fusion at the C5-C6 level.
Redemonstration of similar multilevel moderate severe degenerative
changes of the spine with osteophyte formation and uncovertebral
arthropathy. Multilevel severe osseous neural foraminal stenosis. No
significant osseous central canal stenosis. No acute fracture. No
aggressive appearing focal osseous lesion or focal pathologic
process.

Soft tissues and spinal canal: No prevertebral fluid or swelling. No
visible canal hematoma.

Upper chest: Unremarkable.

Other: None.

CT THORACIC SPINE FINDINGS

Segmentation: 12 rib-bearing thoracic vertebral bodies.

Alignment: Normal.

Vertebrae: Mild multilevel osteophyte formation. No acute displaced
fracture. No suspicious lytic or blastic osseous lesion. No neural
foraminal central canal stenosis.

Paraspinal and other soft tissues: Negative.

Disc levels: Maintained

Other: None.

CT LUMBAR SPINE FINDINGS

Segmentation: 5 non-rib-bearing lumbar vertebral bodies.

Alignment: Slight straightening of the normal cervical lordosis
likely due to positioning.

Vertebrae: Mild L3 through L5 osteophyte formation. Mild L5-S1 facet
arthropathy. Posterior disc bulge at the L4-L5 level. No severe
osseous neural foraminal or central canal stenosis. No acute
displaced fracture. No suspicious lytic or blastic osseous lesions.

Paraspinal and other soft tissues: Negative.

Disc levels: Intervertebral disc space vacuum phenomenon at the
L4-L5 level.

Other: Visualized sacral spine unremarkable. Abdominal aorta
atherosclerotic plaque.
IMPRESSION: 1. No acute displaced fracture or traumatic listhesis of the
cervical, thoracic, lumbar spine.
2. Similar-appearing multilevel moderate to severe cervical spine
stenosis leading to multilevel severe osseous neural foraminal
stenosis.
3. Intervertebral disc space vacuum phenomenon and disc bulge at the
L4-L5 level.

## 2021-01-09 IMAGING — MR MR LUMBAR SPINE W/O CM
5 series · 31 of 48 positions shown · non-contrast
Comparison: CTs of the cervical, thoracic and lumbar spine same
date.

CLINICAL DATA: Left leg weakness following a fall with loss of
consciousness approximately 4 weeks prior. Unsteady gait. Evaluate
for cord compression and cauda equina syndrome.

EXAM:
MRI CERVICAL, THORACIC AND LUMBAR SPINE WITHOUT CONTRAST
TECHNIQUE: Multiplanar and multiecho pulse sequences of the cervical spine, to
include the craniocervical junction and cervicothoracic junction,
and thoracic and lumbar spine, were obtained without intravenous
contrast.

[Series 1: T2 · sagittal · 4.0mm · 1.02mm/px · 6 of 15 slices shown (1 of 2)]
[im 1/15]
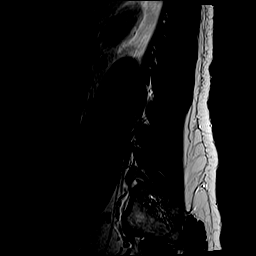
[im 3/15]
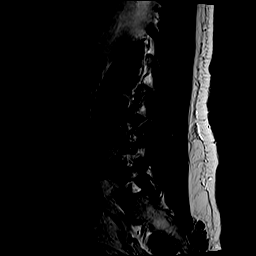
[im 6/15]
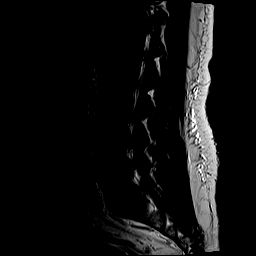
[im 9/15]
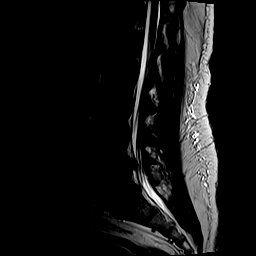
[im 12/15]
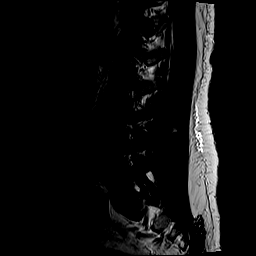
[im 15/15]
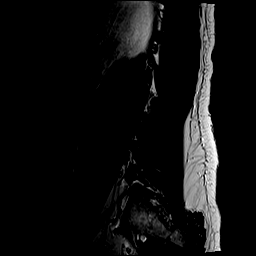

[Series 2: T1 · sagittal · 4.0mm · 1.02mm/px · 6 of 15 slices shown (1 of 2)]
[im 1/15]
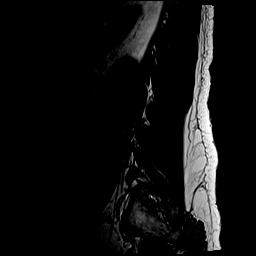
[im 3/15]
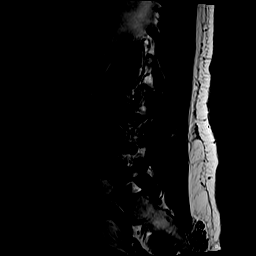
[im 6/15]
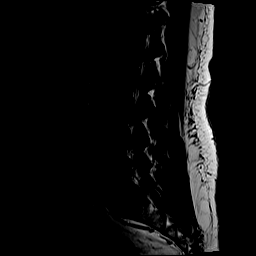
[im 9/15]
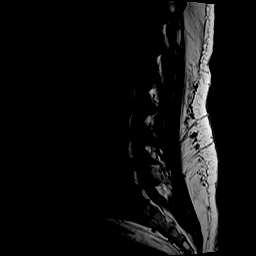
[im 12/15]
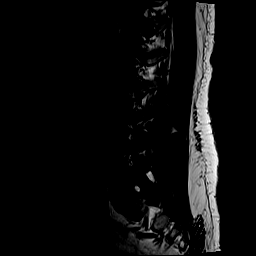
[im 15/15]
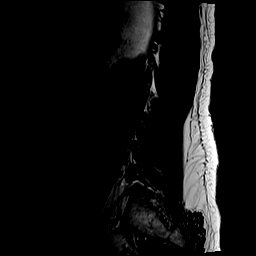

[Series 3: STIR · sagittal · 4.0mm · 0.51mm/px · 1 of 15 slices shown]
[im 1/15]
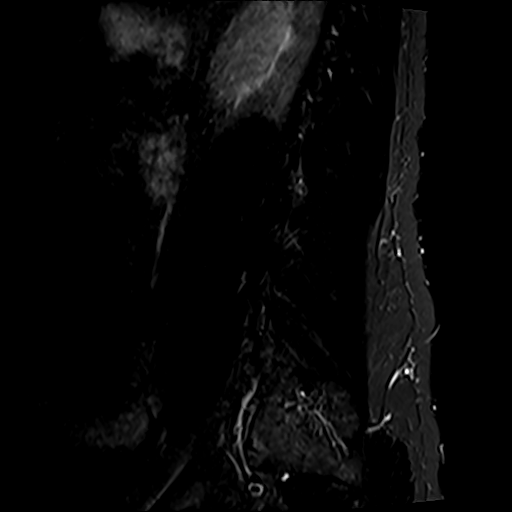

[Series 4: T2 · axial · 4.0mm · 0.78mm/px · z∈[-477,-273]mm · 9 of 36 slices shown (2 of 2)]
[im 1/36]
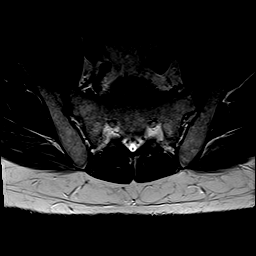
[im 6/36]
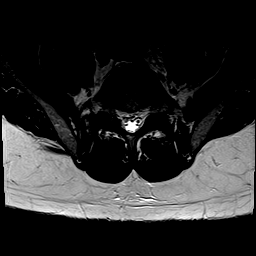
[im 11/36]
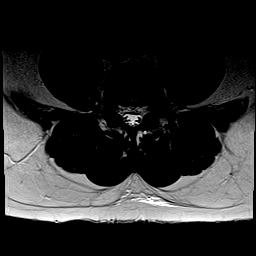
[im 16/36]
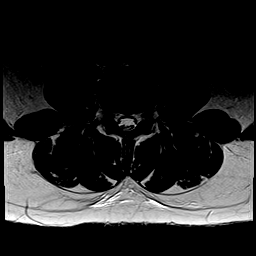
[im 18/36]
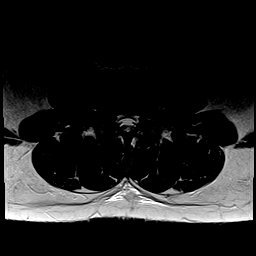
[im 21/36]
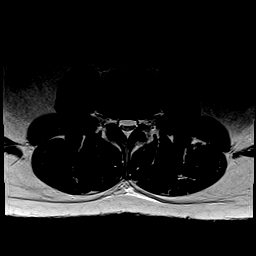
[im 26/36]
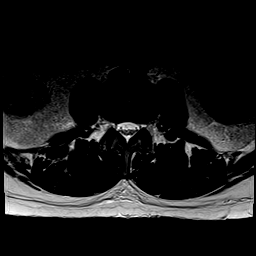
[im 31/36]
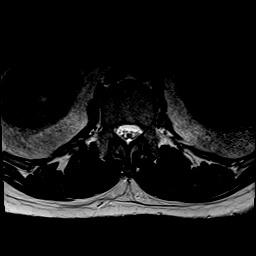
[im 36/36]
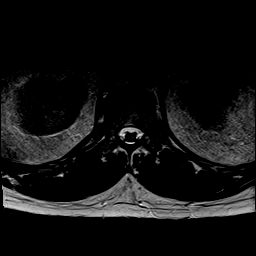

[Series 5: T1 · axial · 4.0mm · 0.39mm/px · z∈[-477,-273]mm · 9 of 36 slices shown (2 of 2)]
[im 1/36]
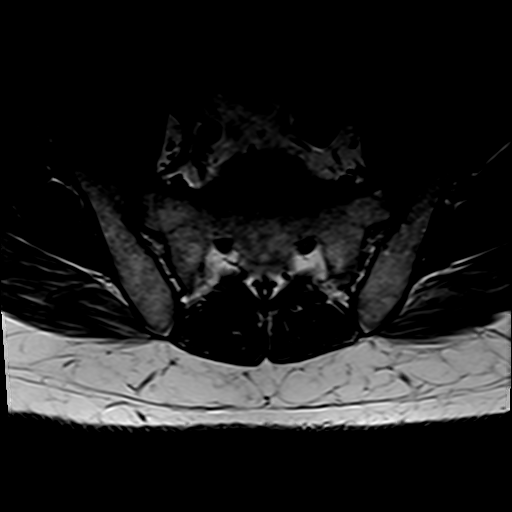
[im 6/36]
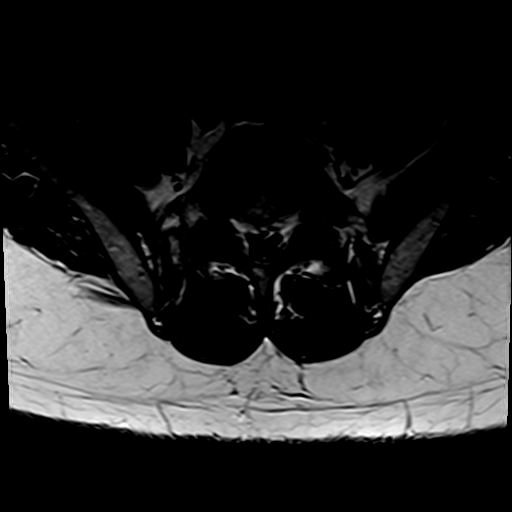
[im 11/36]
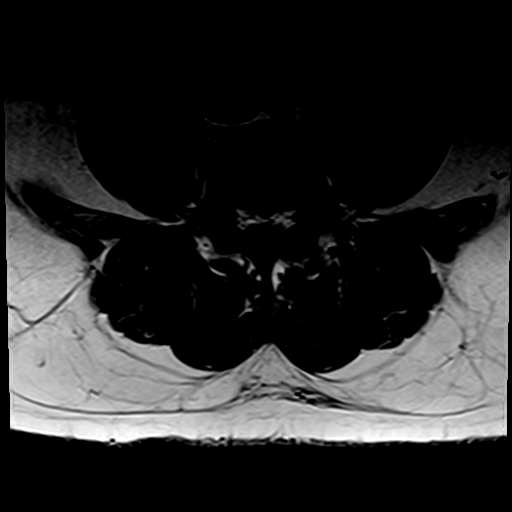
[im 16/36]
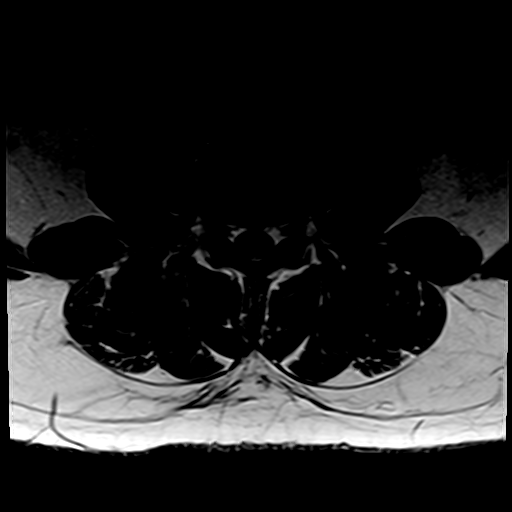
[im 18/36]
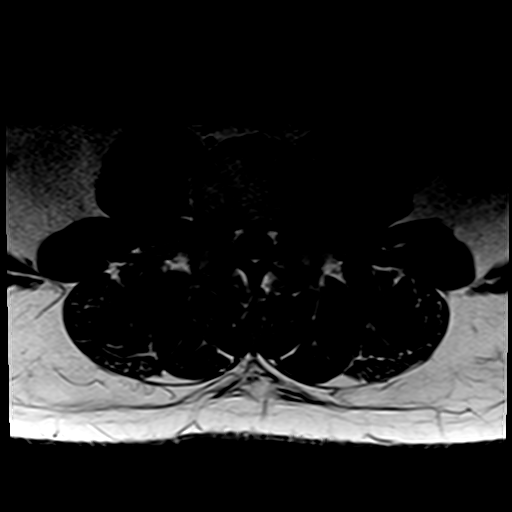
[im 21/36]
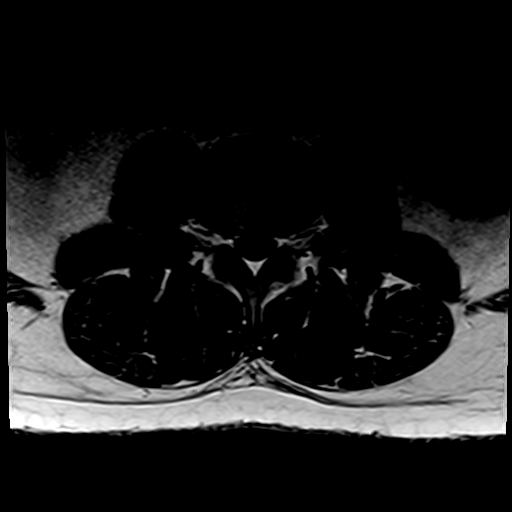
[im 26/36]
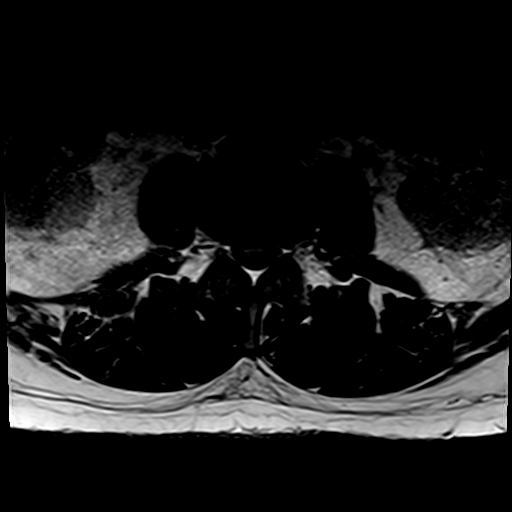
[im 31/36]
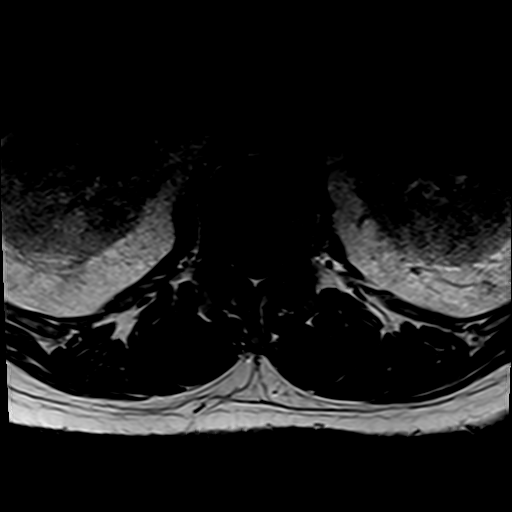
[im 36/36]
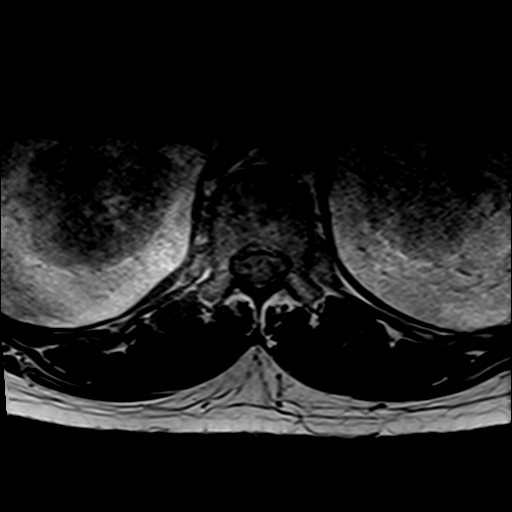

[31 of 48 positions shown; findings below may reference images not displayed]

FINDINGS: MRI CERVICAL SPINE FINDINGS

Alignment: Straightening without focal angulation or listhesis.

Vertebrae: No evidence of acute fracture or focal osseous lesion.
Previous anterior discectomy and fusion at C5-6 with solid interbody
fusion as correlated with preceding CT.

Cord: There are symmetric foci of T2 hyperintensity bilaterally in
the cord at C5, best seen on axial image [DATE]. There is no focal
cord expansion or atrophy at this level. No other cord signal
abnormalities are identified. Given proximity to the prior cervical
fusion, this is likely the sequela of remote compressive myelopathy.

Posterior Fossa, vertebral arteries, paraspinal tissues: The
craniocervical junction appears normal. There are bilateral
vertebral artery flow voids. No paraspinal abnormalities.

Disc levels:

C2-3: Mild disc bulging and uncinate spurring. Mild foraminal
narrowing bilaterally. No cord deformity.

C3-4: Loss of disc height with disc bulging, uncinate spurring and
facet hypertrophy bilaterally. Moderate biforaminal narrowing. No
cord deformity.

C4-5: Loss of disc height with disc bulging, uncinate spurring and
bilateral facet hypertrophy. The CSF surrounding the cord is effaced
with narrowing the AP diameter of the canal to 7 mm. As above, just
below the disc space, there is symmetric T2 hyperintensity in the
cord bilaterally. Moderate to severe foraminal narrowing
bilaterally.

C5-6: Status post anterior discectomy and fusion. Residual uncinate
spurring contributes to moderate foraminal narrowing bilaterally. No
cord deformity.

C6-7: Loss of disc height with disc bulging, uncinate spurring and
bilateral facet hypertrophy. Moderate to severe osseous foraminal
narrowing bilaterally. No cord deformity.

C7-T1: No significant findings.

MRI THORACIC SPINE FINDINGS

Alignment:  Physiologic.

Vertebrae: No acute or suspicious osseous findings.

Cord:  The thoracic cord appears normal in signal and caliber.

Paraspinal and other soft tissues: No significant paraspinal
abnormalities.

Disc levels:

Mild thoracic spine degenerative changes. There is a small central
disc protrusion at T3-4 without cord deformity. There is a small
left paracentral disc protrusion at T4-5. No other significant disc
space findings are present. The neural foramina appear patent. No
cord deformity.

MRI LUMBAR SPINE FINDINGS

Segmentation: There are 5 lumbar type vertebral bodies.

Alignment:  Straightening without focal angulation or listhesis.

Vertebrae: No worrisome osseous lesion, acute fracture or pars
defect. The visualized sacroiliac joints appear unremarkable.

Conus medullaris: Extends to the L1 level and appears normal.

Paraspinal and other soft tissues: No significant paraspinal
findings.

Disc levels:

No significant disc space findings from T12-L1 through L2-3.

L3-4: Mild disc bulging and mild right foraminal narrowing. No nerve
root encroachment.

L4-5: Loss of disc height with disc desiccation and vacuum
phenomenon as correlated with prior CT. There is annular disc
bulging and a moderate-sized left paracentral disc protrusion. There
is mass effect on the thecal sac and left lateral recess with
possible left L5 nerve root encroachment. The right lateral recess
and both foramina are mildly narrowed.

L5-S1: Disc height and hydration are maintained. Mild bilateral
facet hypertrophy without resulting spinal stenosis or nerve root
encroachment.
IMPRESSION: 1. No acute osseous or ligamentous findings in the spine. No clear
explanation for the patient's symptoms.
2. Symmetric foci of T2 hyperintensity in the cord at C5. Given
proximity to the prior cervical fusion, this is likely the sequela
of remote compressive myelopathy. No cord edema or expansion. The
conus medullaris and cauda equina appear normal.
3. Multilevel cervical spondylosis post anterior discectomy and
fusion at C5-6. Mild spinal stenosis at C4-5. Multilevel cervical
foraminal narrowing as detailed above.
4. Mild thoracic spine degenerative changes with small disc
protrusions at T3-4 and T4-5. No cord deformity or foraminal
compromise.
5. Moderate-sized left paracentral disc protrusion at L4-5 with mass
effect on the thecal sac and possible left L5 nerve root
encroachment.

## 2021-01-09 IMAGING — MR MR MRA HEAD W/O CM
1 series · 19 of 48 positions shown · non-contrast
Comparison: None.

CLINICAL DATA: Left lower extremity numbness. Possible recent
stroke.

EXAM:
MRI HEAD WITHOUT CONTRAST
MRA HEAD WITHOUT CONTRAST
TECHNIQUE: Multiplanar, multiecho pulse sequences of the brain and surrounding
structures were obtained without intravenous contrast. Angiographic
images of the head were obtained using MRA technique without
contrast.

[Series 10: TOF · axial · 0.5mm · 0.41mm/px · z∈[-112,-15]mm · 19 of 205 slices shown]
[im 1/205]
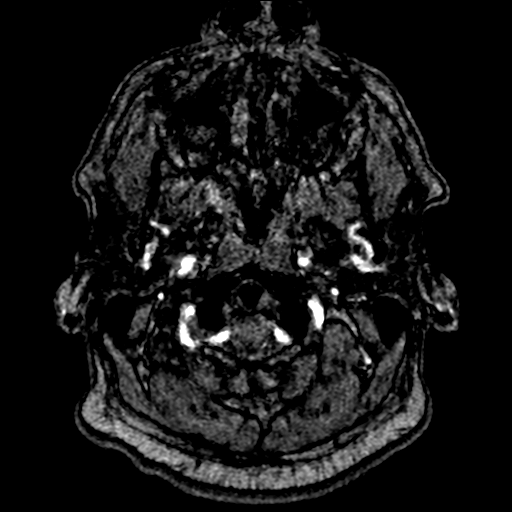
[im 5/205]
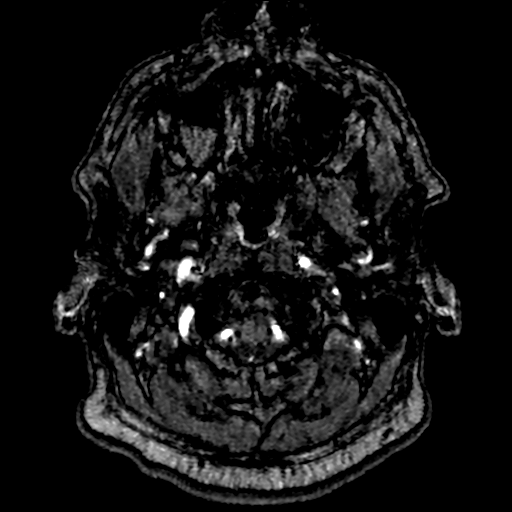
[im 9/205]
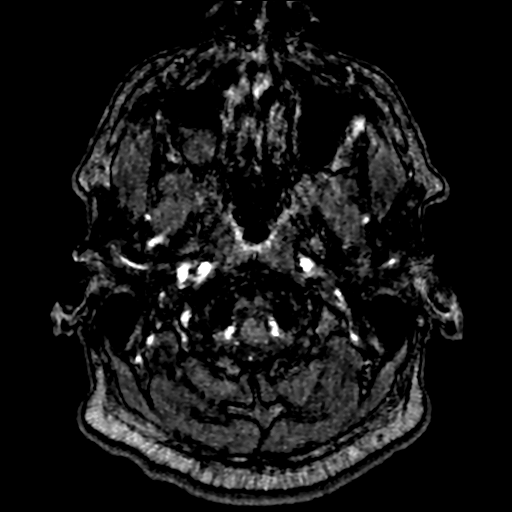
[im 14/205]
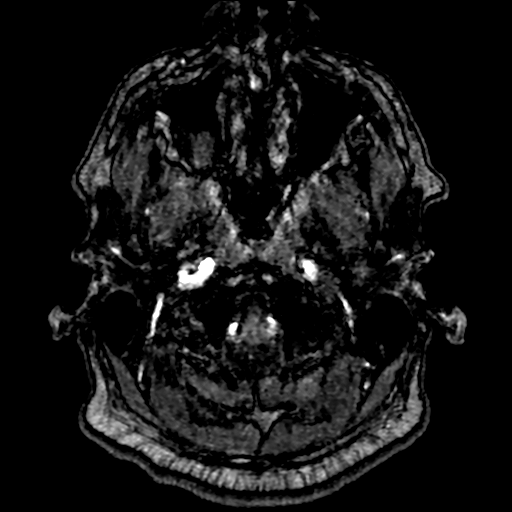
[im 18/205]
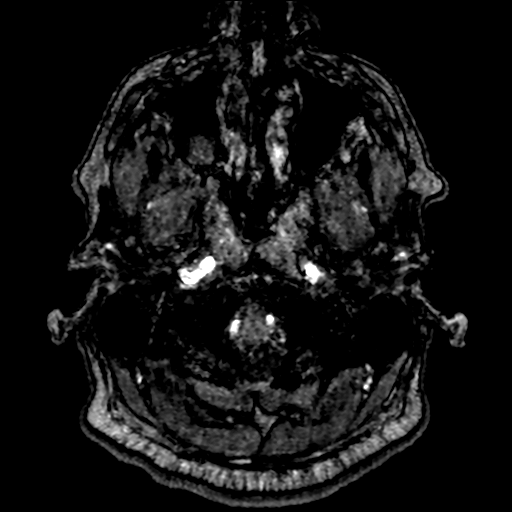
[im 22/205]
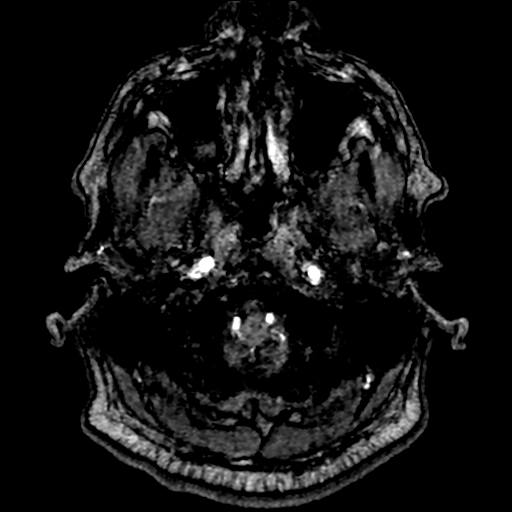
[im 27/205]
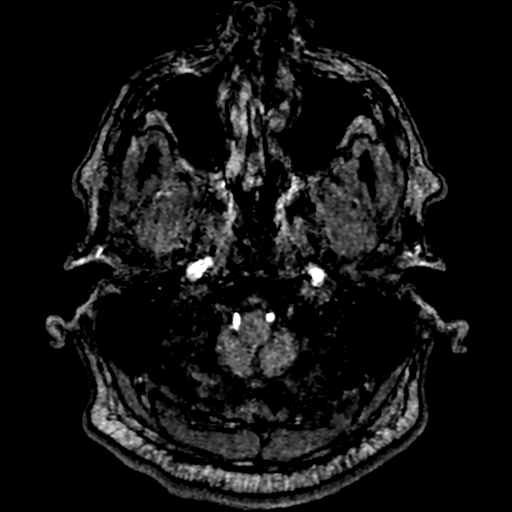
[im 31/205]
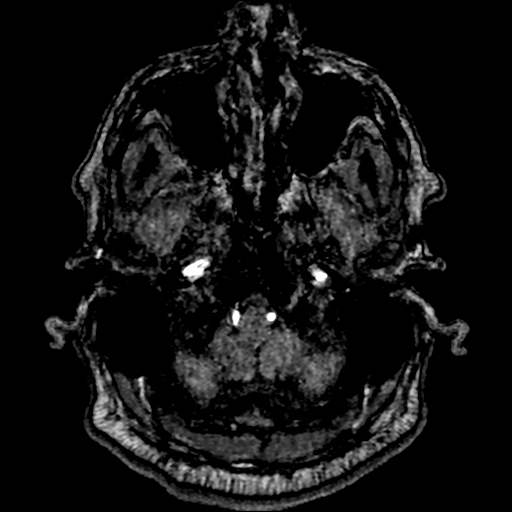
[im 35/205]
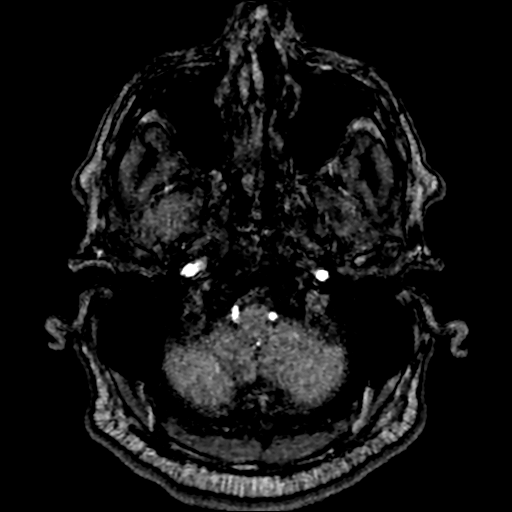
[im 40/205]
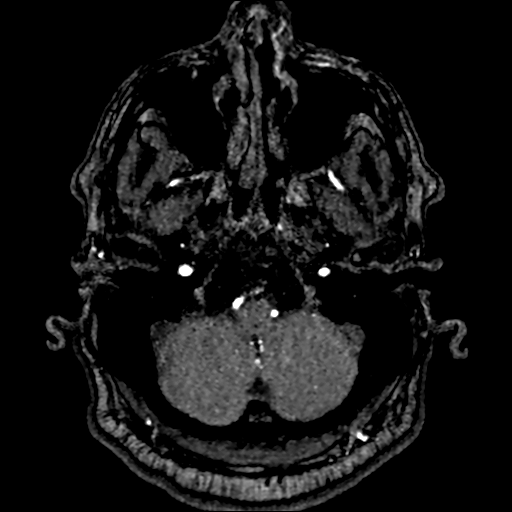
[im 44/205]
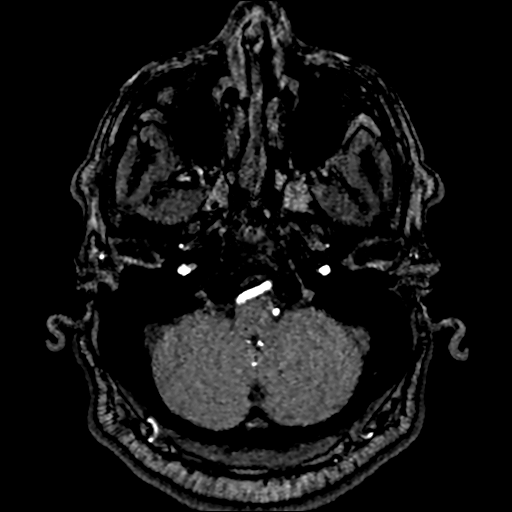
[im 66/205]
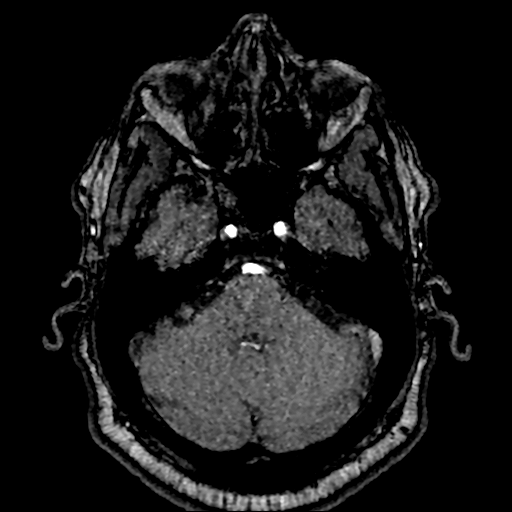
[im 92/205]
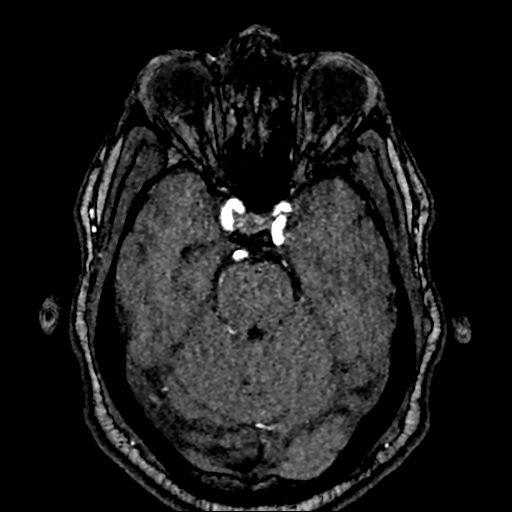
[im 105/205]
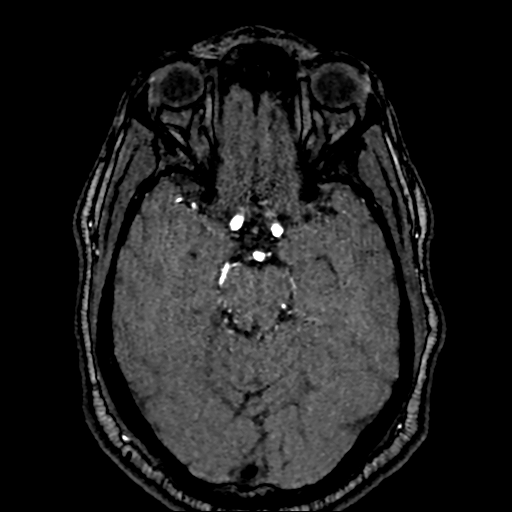
[im 118/205]
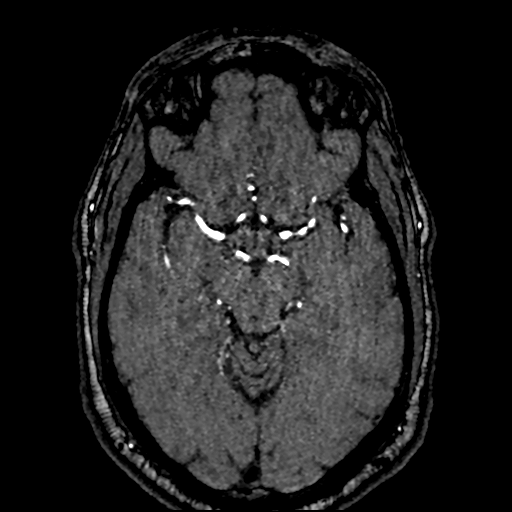
[im 144/205]
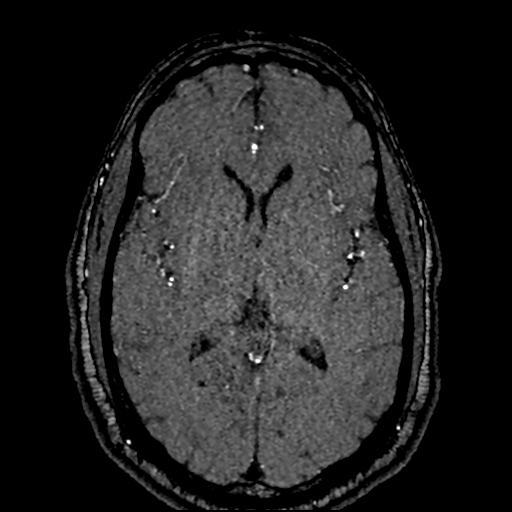
[im 170/205]
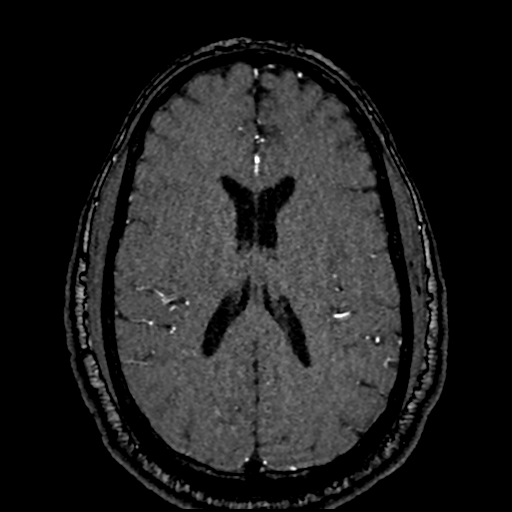
[im 174/205]
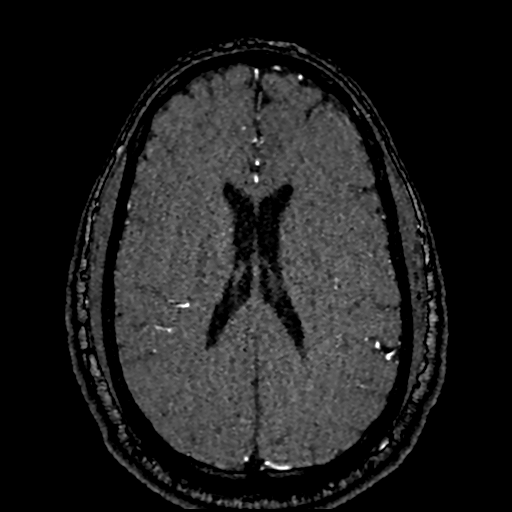
[im 196/205]
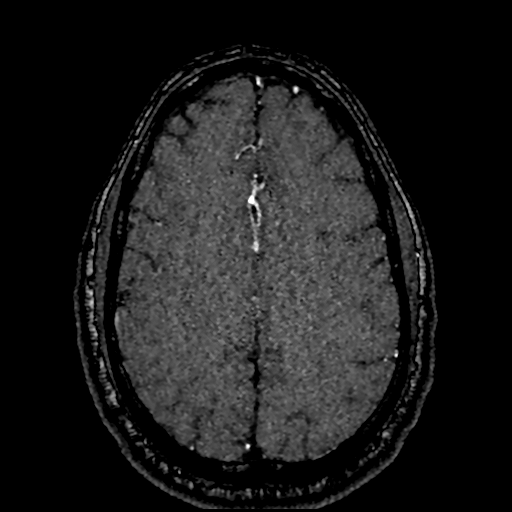

[19 of 48 positions shown; findings below may reference images not displayed]

FINDINGS: MRI HEAD FINDINGS

Brain: No acute infarct, mass effect or extra-axial collection. No
acute or chronic hemorrhage. Normal white matter signal, parenchymal
volume and CSF spaces. The midline structures are normal.

Vascular: Major flow voids are preserved.

Skull and upper cervical spine: Normal calvarium and skull base.
Visualized upper cervical spine and soft tissues are normal.

Sinuses/Orbits:No paranasal sinus fluid levels or advanced mucosal
thickening. No mastoid or middle ear effusion. Normal orbits.

MRA HEAD FINDINGS

POSTERIOR CIRCULATION:

--Vertebral arteries: Normal

--Inferior cerebellar arteries: Normal.

--Basilar artery: Normal.

--Superior cerebellar arteries: Normal.

--Posterior cerebral arteries: Normal.

ANTERIOR CIRCULATION:

--Intracranial internal carotid arteries: Normal.

--Anterior cerebral arteries (ACA): Normal.

--Middle cerebral arteries (MCA): Normal.

ANATOMIC VARIANTS: None
IMPRESSION: Normal MRI/MRA of the brain.

## 2021-01-09 IMAGING — MR MR CERVICAL SPINE W/O CM
5 series · 34 of 48 positions shown · non-contrast
Comparison: CTs of the cervical, thoracic and lumbar spine same
date.

CLINICAL DATA: Left leg weakness following a fall with loss of
consciousness approximately 4 weeks prior. Unsteady gait. Evaluate
for cord compression and cauda equina syndrome.

EXAM:
MRI CERVICAL, THORACIC AND LUMBAR SPINE WITHOUT CONTRAST
TECHNIQUE: Multiplanar and multiecho pulse sequences of the cervical spine, to
include the craniocervical junction and cervicothoracic junction,
and thoracic and lumbar spine, were obtained without intravenous
contrast.

[Series 1: T2 · sagittal · 3.0mm · 0.62mm/px · 6 of 15 slices shown (1 of 2)]
[im 1/15]
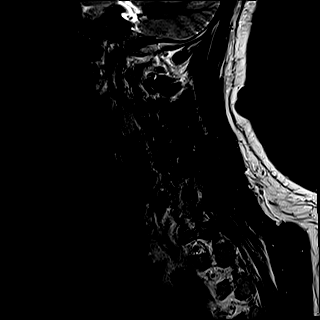
[im 3/15]
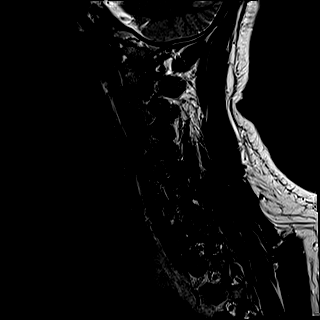
[im 6/15]
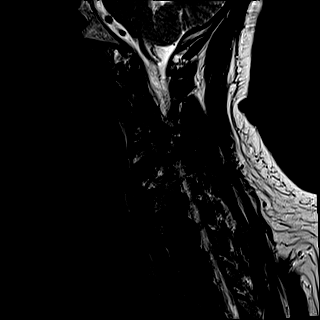
[im 9/15]
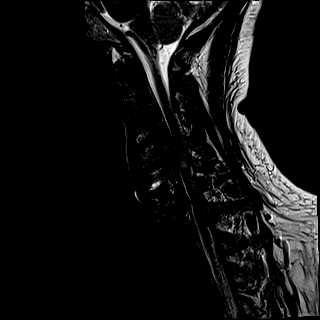
[im 12/15]
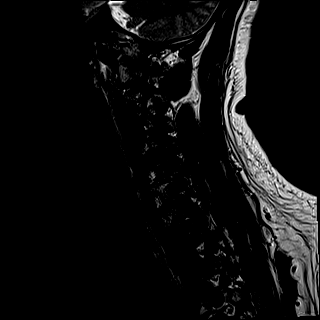
[im 15/15]
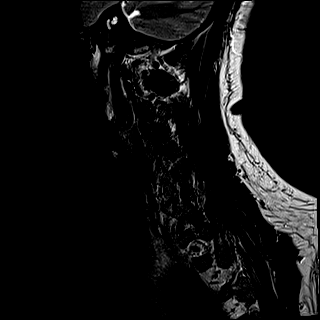

[Series 2: FLAIR · sagittal · 3.0mm · 0.78mm/px · 7 of 15 slices shown]
[im 1/15]
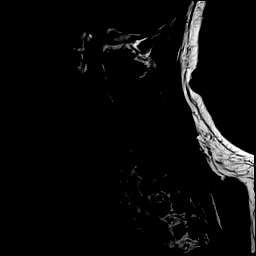
[im 3/15]
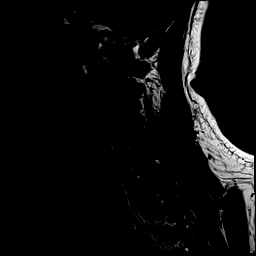
[im 5/15]
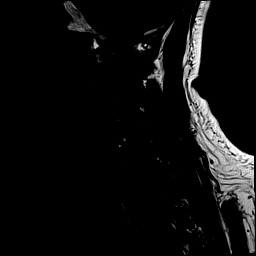
[im 8/15]
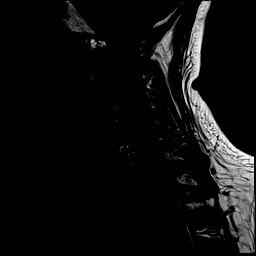
[im 10/15]
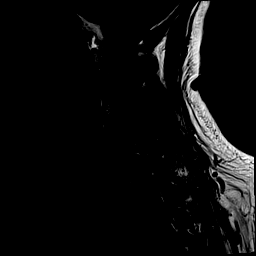
[im 12/15]
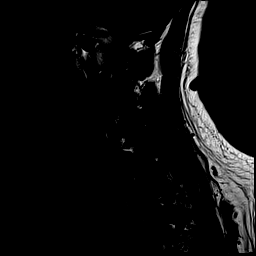
[im 15/15]
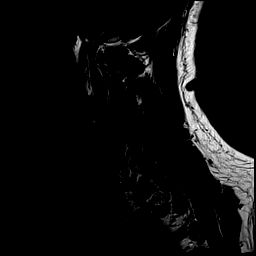

[Series 3: STIR · sagittal · 3.0mm · 0.62mm/px · 7 of 15 slices shown]
[im 1/15]
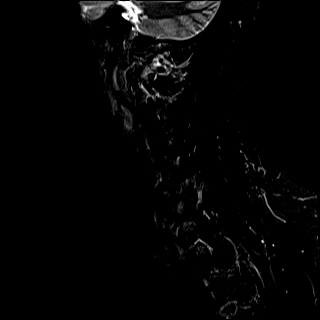
[im 3/15]
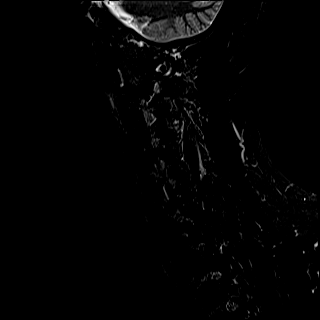
[im 5/15]
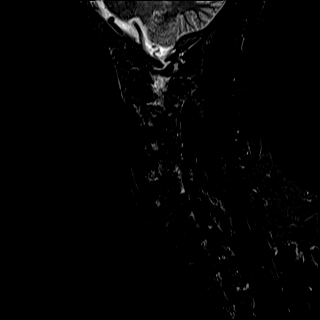
[im 8/15]
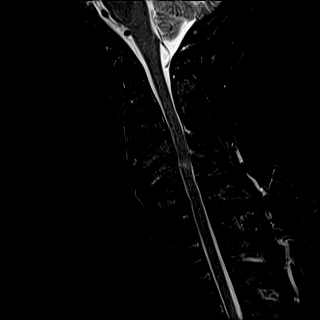
[im 10/15]
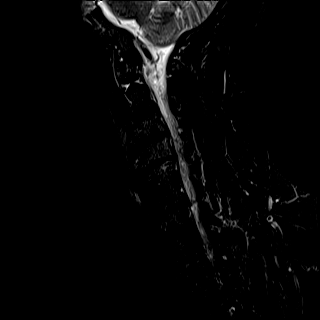
[im 12/15]
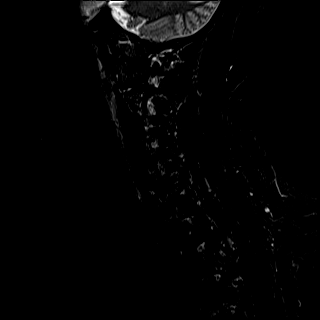
[im 15/15]
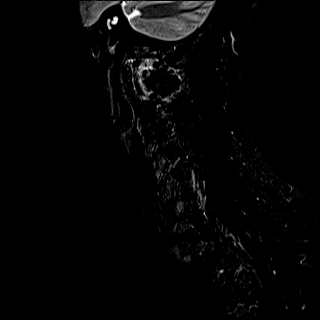

[Series 4: T2 · axial · 3.0mm · 0.70mm/px · z∈[-38,+56]mm · 8 of 29 slices shown (2 of 2)]
[im 1/29]
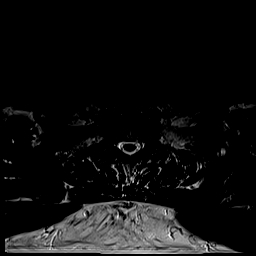
[im 5/29]
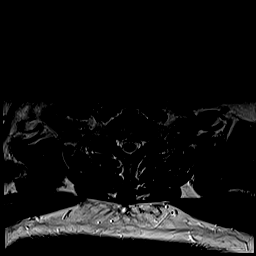
[im 9/29]
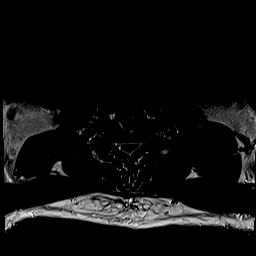
[im 13/29]
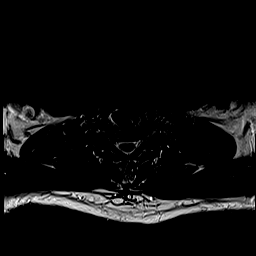
[im 16/29]
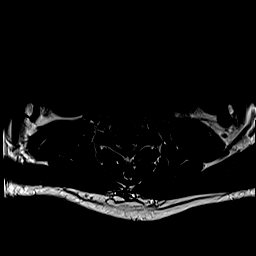
[im 20/29]
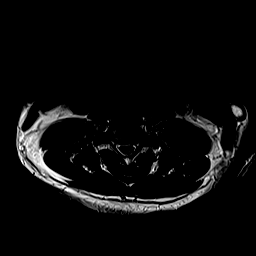
[im 24/29]
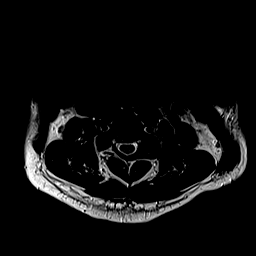
[im 29/29]
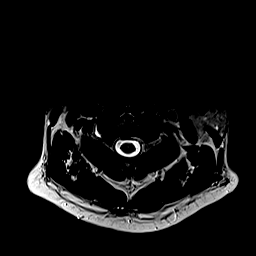

[Series 5: csp ax mpgr · axial · 3.0mm · 0.35mm/px · z∈[-38,+26]mm · 6 of 29 slices shown]
[im 1/29]
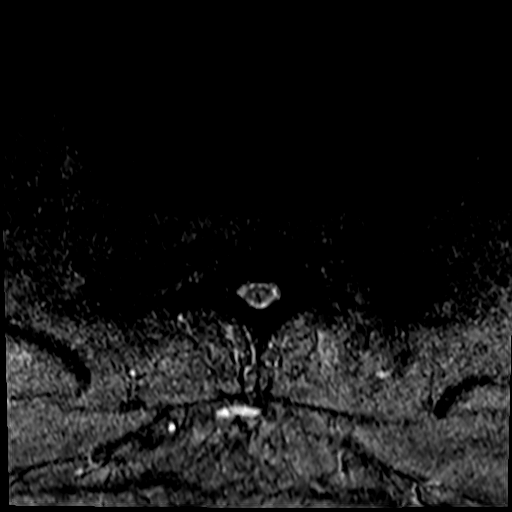
[im 5/29]
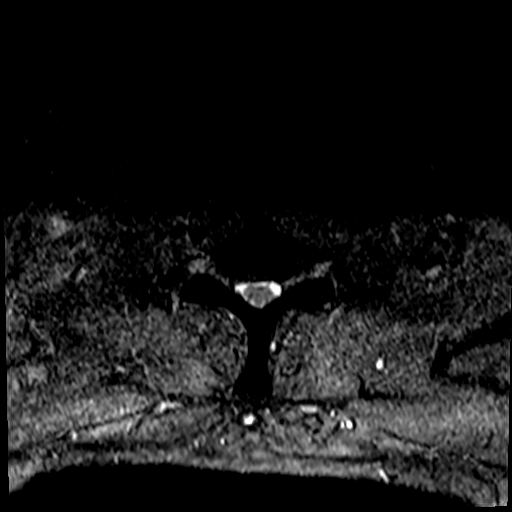
[im 9/29]
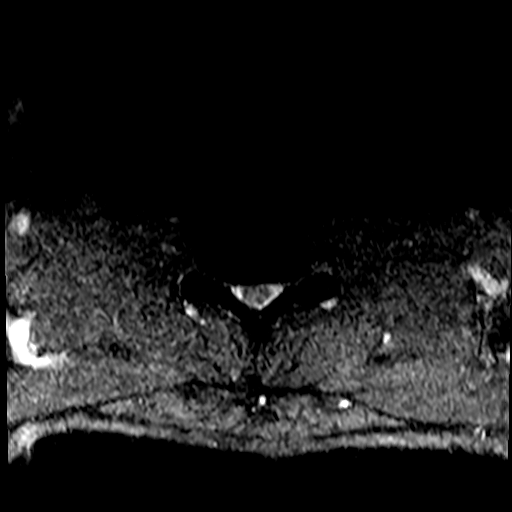
[im 13/29]
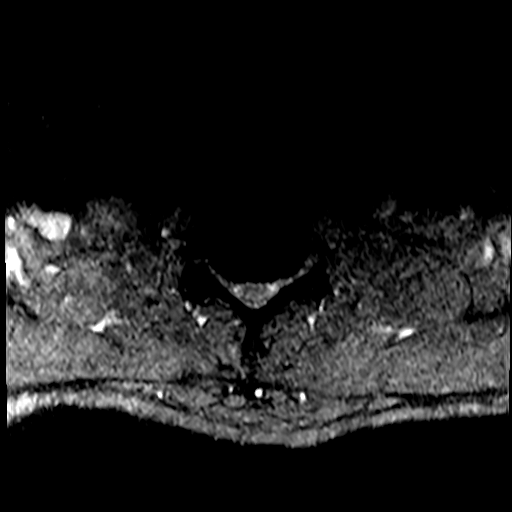
[im 16/29]
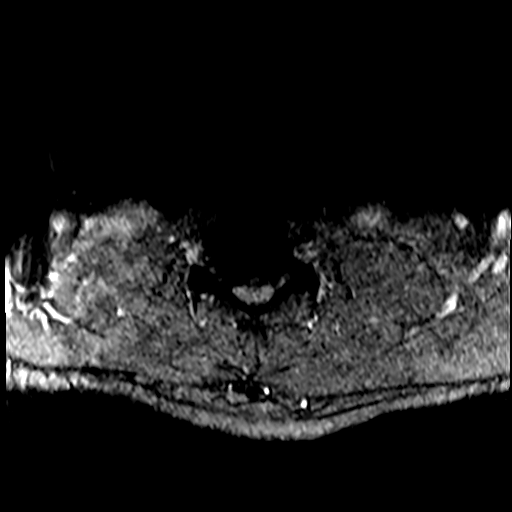
[im 20/29]
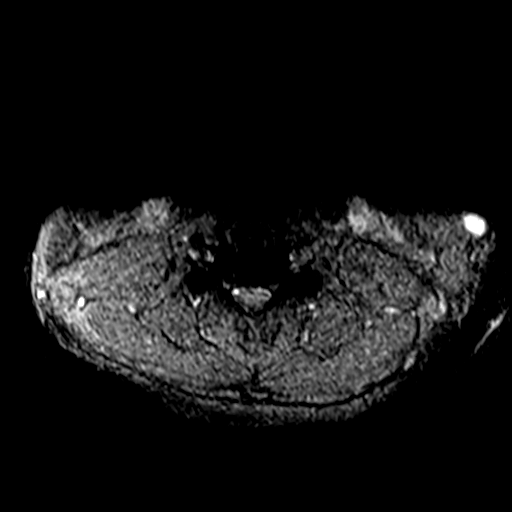

[34 of 48 positions shown; findings below may reference images not displayed]

FINDINGS: MRI CERVICAL SPINE FINDINGS

Alignment: Straightening without focal angulation or listhesis.

Vertebrae: No evidence of acute fracture or focal osseous lesion.
Previous anterior discectomy and fusion at C5-6 with solid interbody
fusion as correlated with preceding CT.

Cord: There are symmetric foci of T2 hyperintensity bilaterally in
the cord at C5, best seen on axial image [DATE]. There is no focal
cord expansion or atrophy at this level. No other cord signal
abnormalities are identified. Given proximity to the prior cervical
fusion, this is likely the sequela of remote compressive myelopathy.

Posterior Fossa, vertebral arteries, paraspinal tissues: The
craniocervical junction appears normal. There are bilateral
vertebral artery flow voids. No paraspinal abnormalities.

Disc levels:

C2-3: Mild disc bulging and uncinate spurring. Mild foraminal
narrowing bilaterally. No cord deformity.

C3-4: Loss of disc height with disc bulging, uncinate spurring and
facet hypertrophy bilaterally. Moderate biforaminal narrowing. No
cord deformity.

C4-5: Loss of disc height with disc bulging, uncinate spurring and
bilateral facet hypertrophy. The CSF surrounding the cord is effaced
with narrowing the AP diameter of the canal to 7 mm. As above, just
below the disc space, there is symmetric T2 hyperintensity in the
cord bilaterally. Moderate to severe foraminal narrowing
bilaterally.

C5-6: Status post anterior discectomy and fusion. Residual uncinate
spurring contributes to moderate foraminal narrowing bilaterally. No
cord deformity.

C6-7: Loss of disc height with disc bulging, uncinate spurring and
bilateral facet hypertrophy. Moderate to severe osseous foraminal
narrowing bilaterally. No cord deformity.

C7-T1: No significant findings.

MRI THORACIC SPINE FINDINGS

Alignment:  Physiologic.

Vertebrae: No acute or suspicious osseous findings.

Cord:  The thoracic cord appears normal in signal and caliber.

Paraspinal and other soft tissues: No significant paraspinal
abnormalities.

Disc levels:

Mild thoracic spine degenerative changes. There is a small central
disc protrusion at T3-4 without cord deformity. There is a small
left paracentral disc protrusion at T4-5. No other significant disc
space findings are present. The neural foramina appear patent. No
cord deformity.

MRI LUMBAR SPINE FINDINGS

Segmentation: There are 5 lumbar type vertebral bodies.

Alignment:  Straightening without focal angulation or listhesis.

Vertebrae: No worrisome osseous lesion, acute fracture or pars
defect. The visualized sacroiliac joints appear unremarkable.

Conus medullaris: Extends to the L1 level and appears normal.

Paraspinal and other soft tissues: No significant paraspinal
findings.

Disc levels:

No significant disc space findings from T12-L1 through L2-3.

L3-4: Mild disc bulging and mild right foraminal narrowing. No nerve
root encroachment.

L4-5: Loss of disc height with disc desiccation and vacuum
phenomenon as correlated with prior CT. There is annular disc
bulging and a moderate-sized left paracentral disc protrusion. There
is mass effect on the thecal sac and left lateral recess with
possible left L5 nerve root encroachment. The right lateral recess
and both foramina are mildly narrowed.

L5-S1: Disc height and hydration are maintained. Mild bilateral
facet hypertrophy without resulting spinal stenosis or nerve root
encroachment.
IMPRESSION: 1. No acute osseous or ligamentous findings in the spine. No clear
explanation for the patient's symptoms.
2. Symmetric foci of T2 hyperintensity in the cord at C5. Given
proximity to the prior cervical fusion, this is likely the sequela
of remote compressive myelopathy. No cord edema or expansion. The
conus medullaris and cauda equina appear normal.
3. Multilevel cervical spondylosis post anterior discectomy and
fusion at C5-6. Mild spinal stenosis at C4-5. Multilevel cervical
foraminal narrowing as detailed above.
4. Mild thoracic spine degenerative changes with small disc
protrusions at T3-4 and T4-5. No cord deformity or foraminal
compromise.
5. Moderate-sized left paracentral disc protrusion at L4-5 with mass
effect on the thecal sac and possible left L5 nerve root
encroachment.

## 2021-01-09 IMAGING — MR MR HEAD WO/W CM
13 series · 47 of 48 positions shown · IV contrast (8ml Gadavist)
Comparison: None.

CLINICAL DATA: Left lower extremity numbness. Possible recent
stroke.

EXAM:
MRI HEAD WITHOUT CONTRAST
MRA HEAD WITHOUT CONTRAST
TECHNIQUE: Multiplanar, multiecho pulse sequences of the brain and surrounding
structures were obtained without intravenous contrast. Angiographic
images of the head were obtained using MRA technique without
contrast.

[Series 5: ax dwi_tracew · axial · 3.0mm · 0.60mm/px · z∈[-120,+35]mm · 4 of 48 slices shown]
[im 1/48]
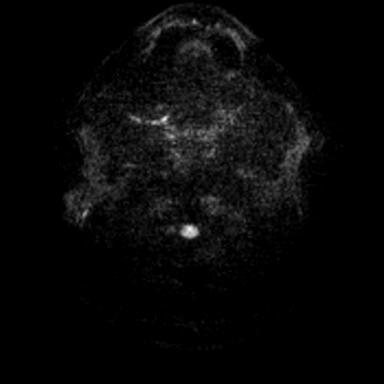
[im 16/48]
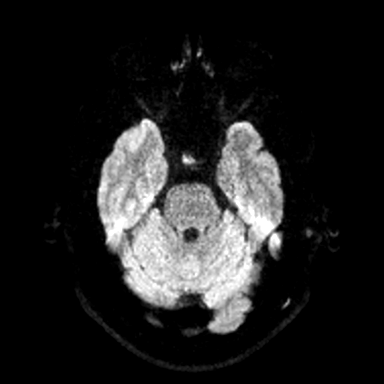
[im 32/48]
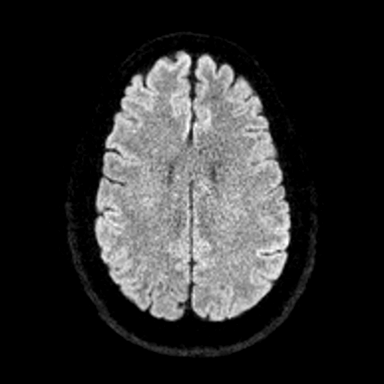
[im 48/48]
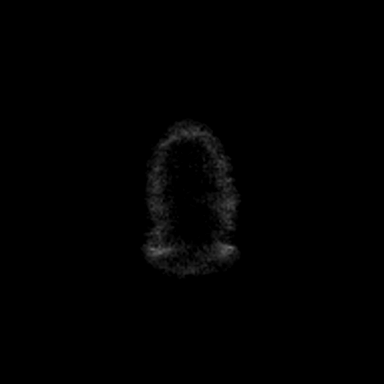

[Series 6: ax dwi_adc · axial · 3.0mm · 0.60mm/px · z∈[-120,+35]mm · 3 of 48 slices shown]
[im 1/48]
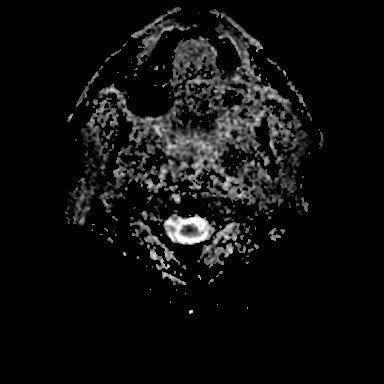
[im 24/48]
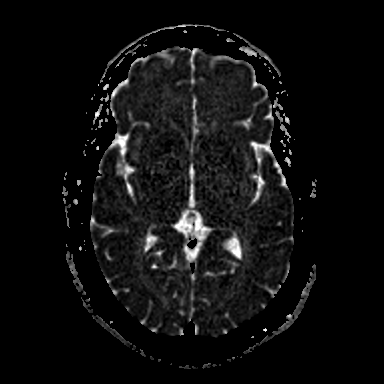
[im 48/48]
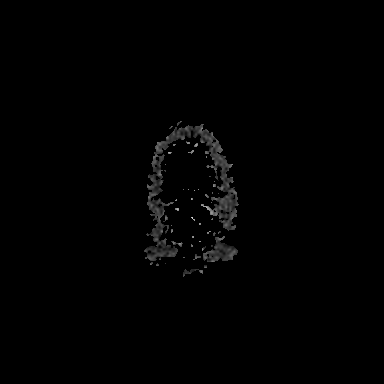

[Series 7: cor dwi_tracew · coronal · 5.0mm · 0.60mm/px · 2 of 40 slices shown]
[im 1/40]
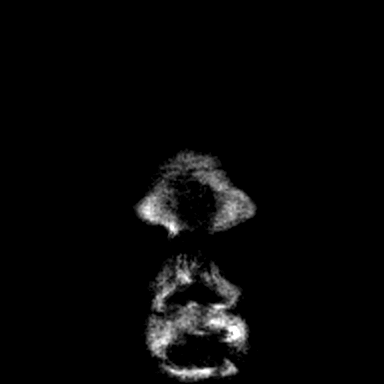
[im 40/40]
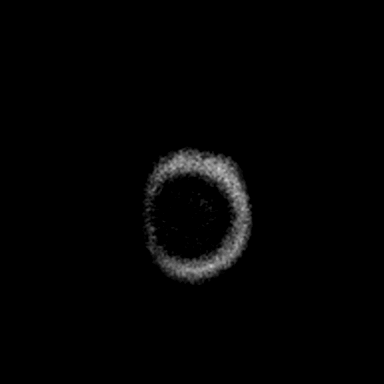

[Series 8: cor dwi_adc · coronal · 5.0mm · 0.60mm/px · 2 of 40 slices shown]
[im 1/40]
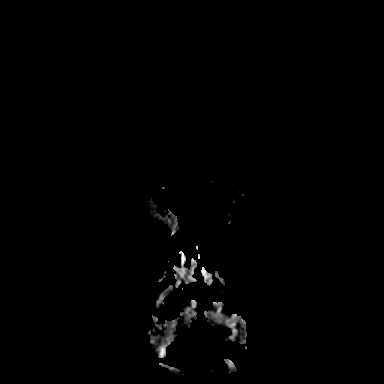
[im 40/40]
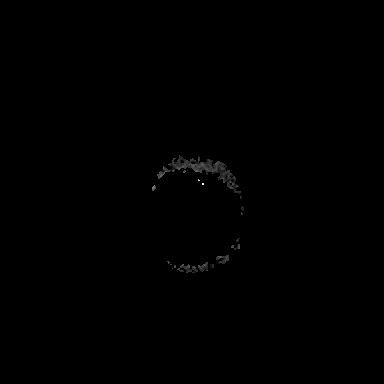

[Series 9: T1 · sagittal · 5.0mm · 0.62mm/px · 1 of 21 slices shown (1 of 2)]
[im 1/21]
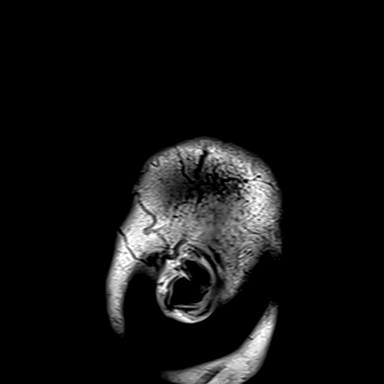

[Series 15: T2 · axial · 5.0mm · 0.53mm/px · z∈[-120,+36]mm · 2 of 27 slices shown]
[im 1/27]
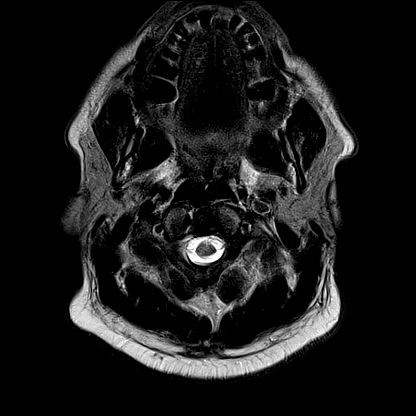
[im 27/27]
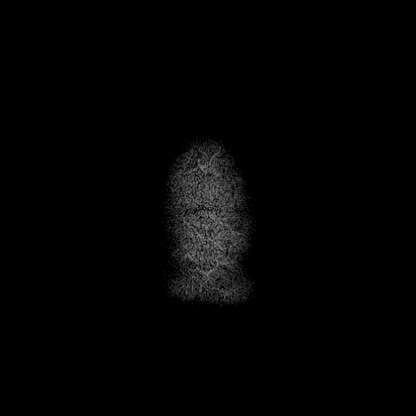

[Series 17: pha_images · axial · 3.0mm · 0.90mm/px · z∈[-128,+46]mm · 3 of 58 slices shown]
[im 1/58]
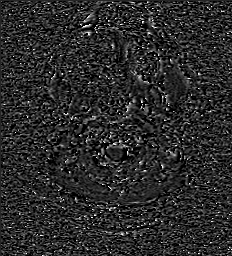
[im 29/58]
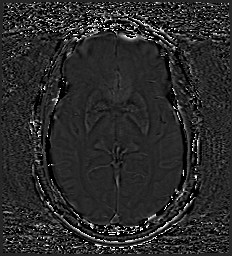
[im 58/58]
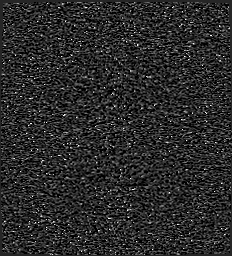

[Series 18: swi_images · axial · 3.0mm · 0.90mm/px · z∈[-131,+46]mm · 4 of 60 slices shown]
[im 1/60]
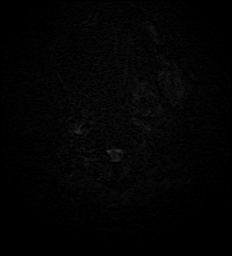
[im 20/60]
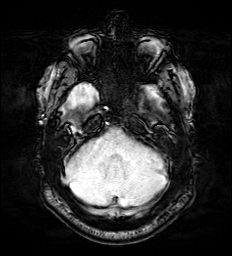
[im 40/60]
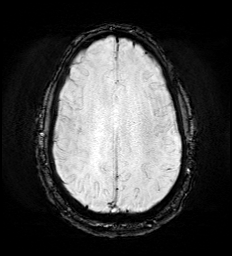
[im 60/60]
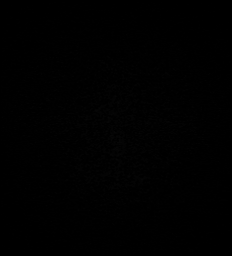

[Series 20: FLAIR · axial · 3.0mm · 0.53mm/px · z∈[-123,+38]mm · 3 of 55 slices shown]
[im 1/55]
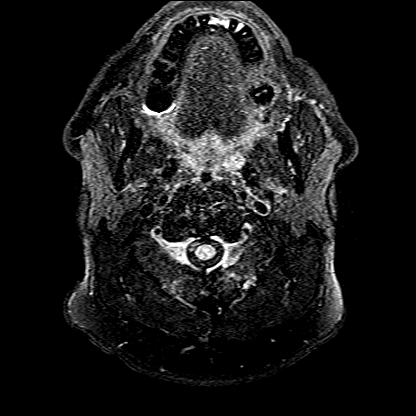
[im 28/55]
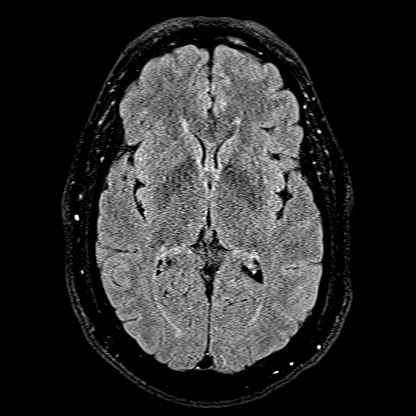
[im 55/55]
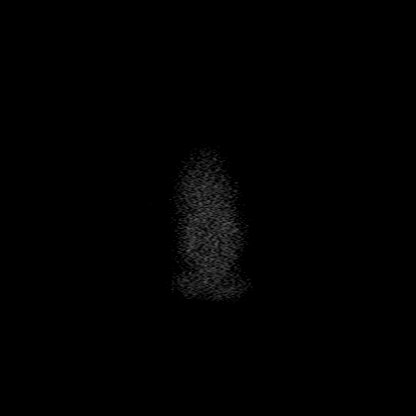

[Series 21: T1 · axial · 1.0mm · 0.98mm/px · z∈[-131,+44]mm · 9 of 176 slices shown (2 of 2)]
[im 1/176]
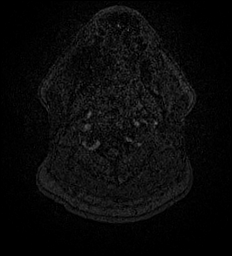
[im 20/176]
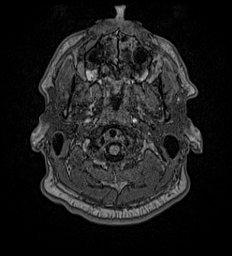
[im 39/176]
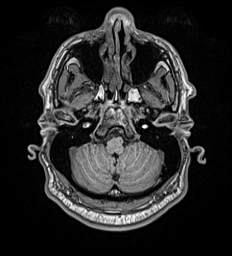
[im 59/176]
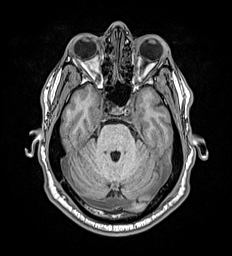
[im 78/176]
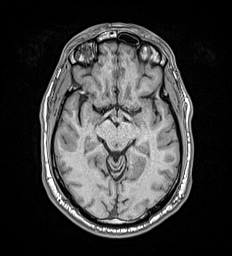
[im 98/176]
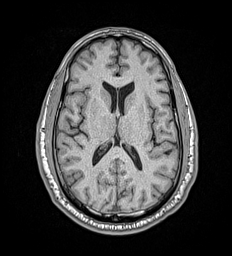
[im 117/176]
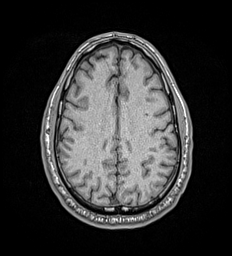
[im 156/176]
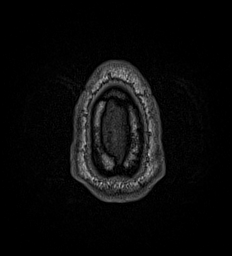
[im 176/176]
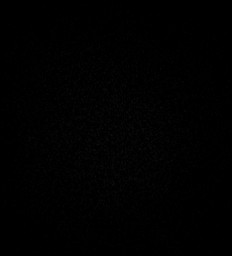

[Series 22: T2 post-contrast · coronal · 5.0mm · 0.57mm/px · 2 of 31 slices shown]
[im 1/31]
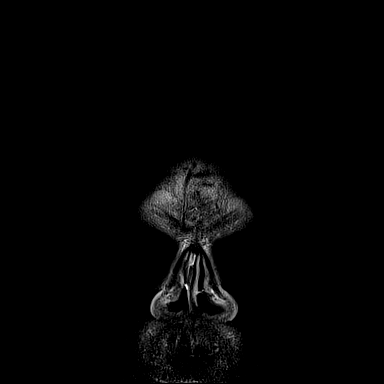
[im 31/31]
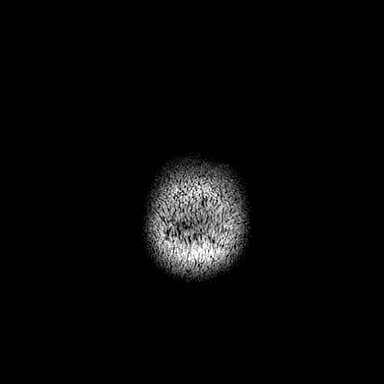

[Series 23: T1 post-contrast · axial · 1.0mm · 0.98mm/px · z∈[-131,+44]mm · 10 of 176 slices shown (1 of 2)]
[im 1/176]
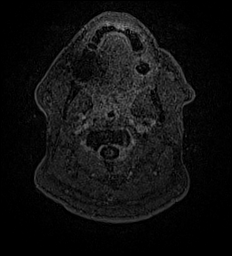
[im 20/176]
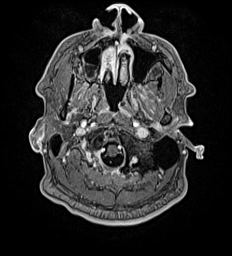
[im 39/176]
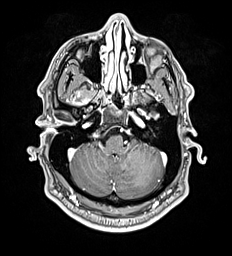
[im 59/176]
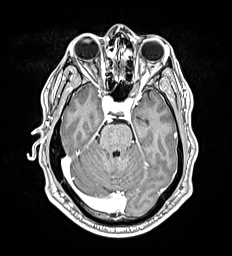
[im 78/176]
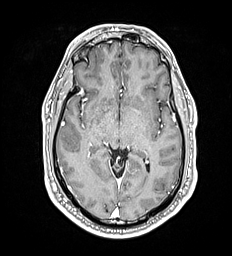
[im 98/176]
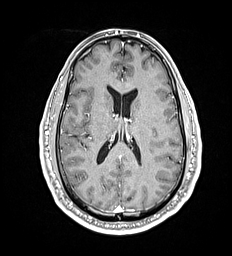
[im 117/176]
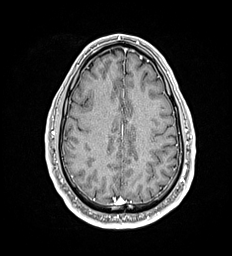
[im 137/176]
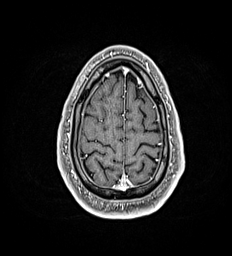
[im 156/176]
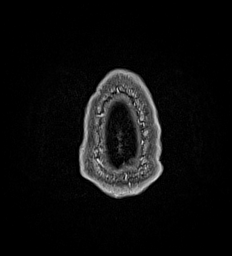
[im 176/176]
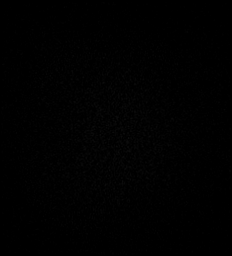

[Series 24: T1 post-contrast · coronal · 5.0mm · 0.57mm/px · 2 of 31 slices shown (2 of 2)]
[im 1/31]
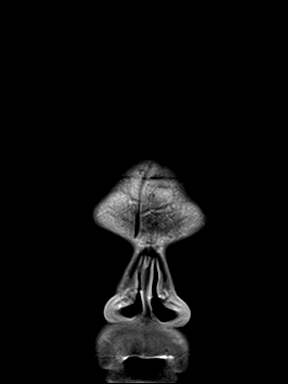
[im 31/31]
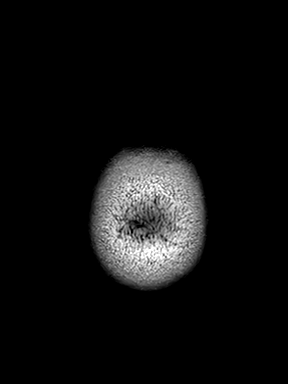

[47 of 48 positions shown; findings below may reference images not displayed]

FINDINGS: MRI HEAD FINDINGS

Brain: No acute infarct, mass effect or extra-axial collection. No
acute or chronic hemorrhage. Normal white matter signal, parenchymal
volume and CSF spaces. The midline structures are normal.

Vascular: Major flow voids are preserved.

Skull and upper cervical spine: Normal calvarium and skull base.
Visualized upper cervical spine and soft tissues are normal.

Sinuses/Orbits:No paranasal sinus fluid levels or advanced mucosal
thickening. No mastoid or middle ear effusion. Normal orbits.

MRA HEAD FINDINGS

POSTERIOR CIRCULATION:

--Vertebral arteries: Normal

--Inferior cerebellar arteries: Normal.

--Basilar artery: Normal.

--Superior cerebellar arteries: Normal.

--Posterior cerebral arteries: Normal.

ANTERIOR CIRCULATION:

--Intracranial internal carotid arteries: Normal.

--Anterior cerebral arteries (ACA): Normal.

--Middle cerebral arteries (MCA): Normal.

ANATOMIC VARIANTS: None
IMPRESSION: Normal MRI/MRA of the brain.

## 2021-01-09 IMAGING — CT CT L SPINE W/O CM
3 of 4 series · 12 of 33 positions shown, 13 images · non-contrast
Comparison: CT cervical spine [DATE].

CLINICAL DATA: about a month ago coming in for a fall and being
diagnosed with a concussion. Pt states he now has a "pimp" walk to
the left leg, with left leg weakness.

EXAM:
CT CERVICAL, THORACIC, AND LUMBAR SPINE WITHOUT CONTRAST
TECHNIQUE: Multidetector CT imaging of the cervical, thoracic and lumbar spine
was performed without intravenous contrast. Multiplanar CT image
reconstructions were also generated.

[Series 4: l spine soft · axial · 0.36mm/px · z∈[-546,-336]mm · 4 of 153 slices shown, 5 images]
[im 24/153  soft-tissue]
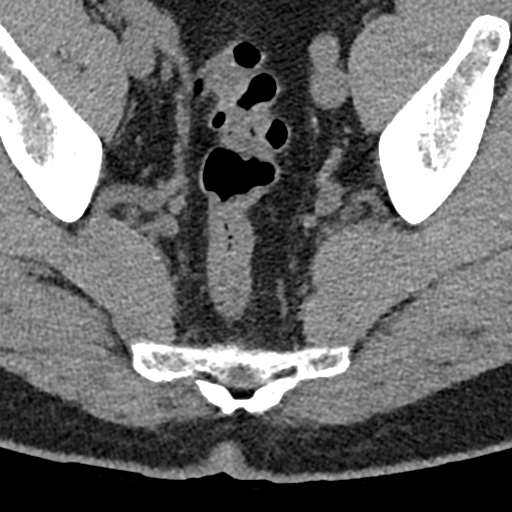
[im 24/153  bone]
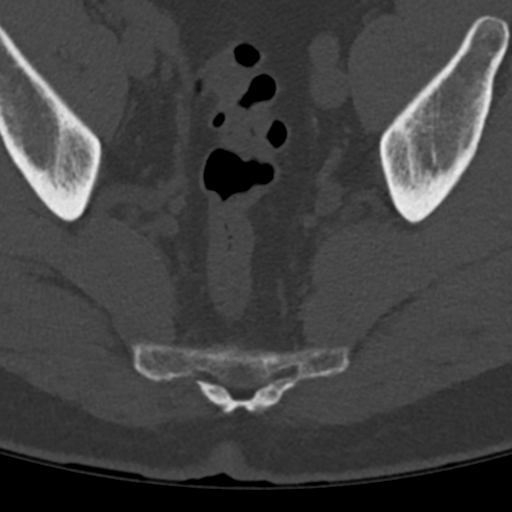
[im 59/153  bone]
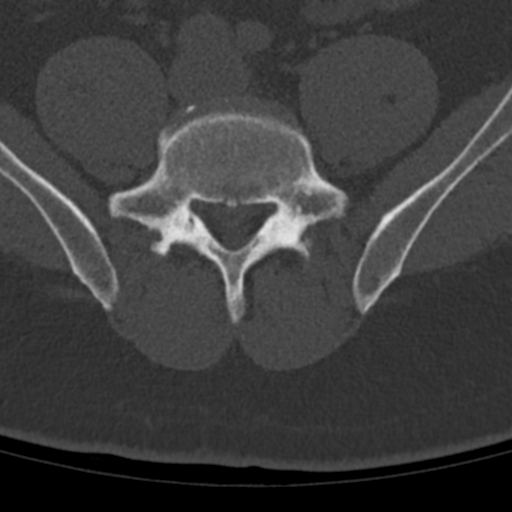
[im 94/153  bone]
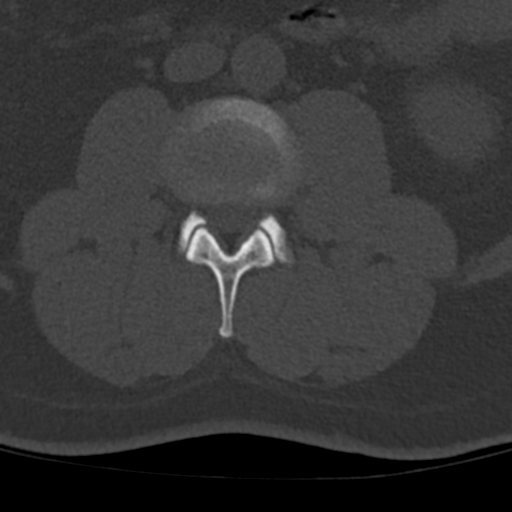
[im 129/153  bone]
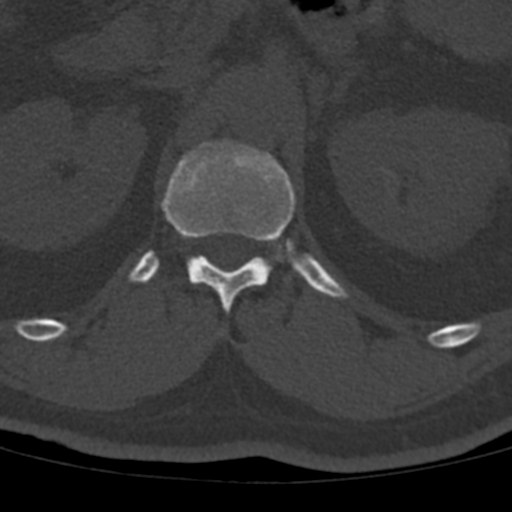

[Series 6: sagittal st · sagittal · 0.39mm/px · 5 of 99 slices shown]
[im 17/99  bone]
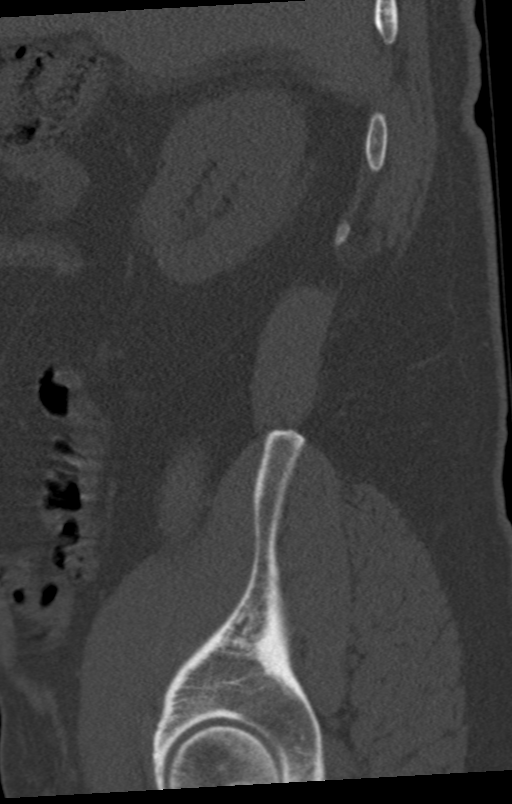
[im 33/99  bone]
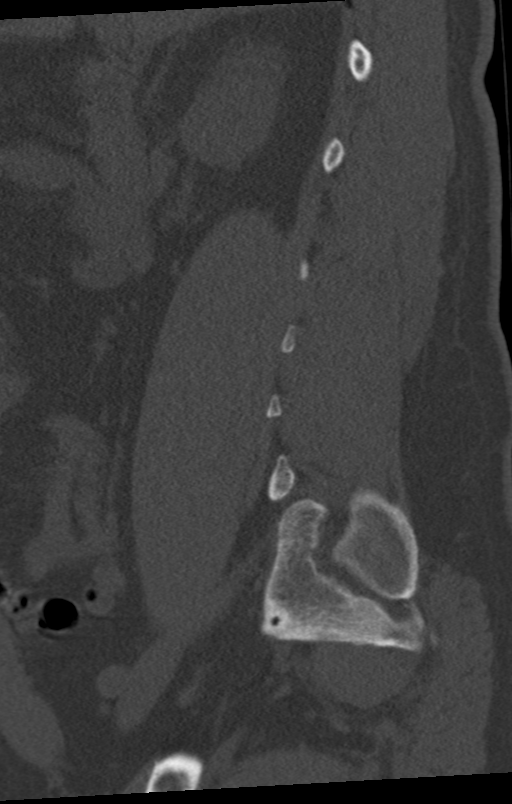
[im 50/99  bone]
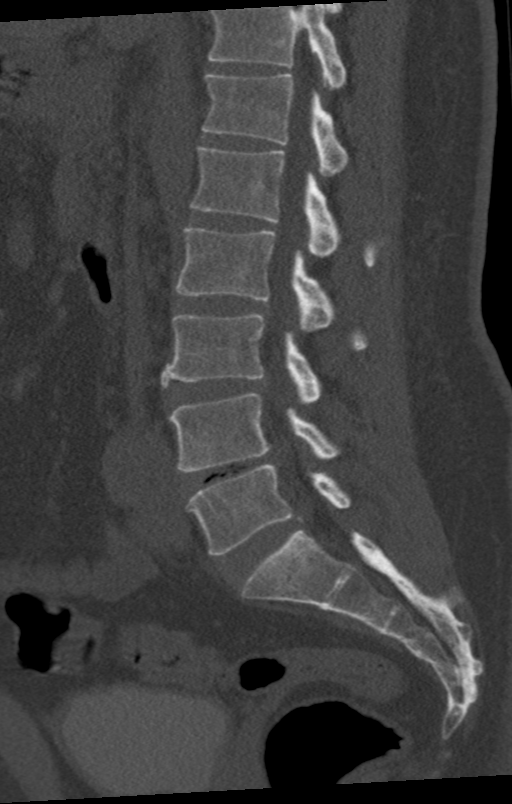
[im 66/99  bone]
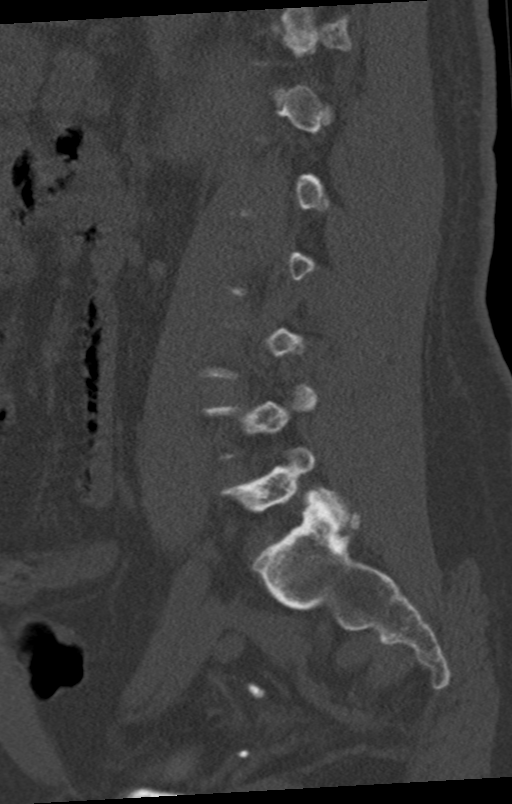
[im 82/99  bone]
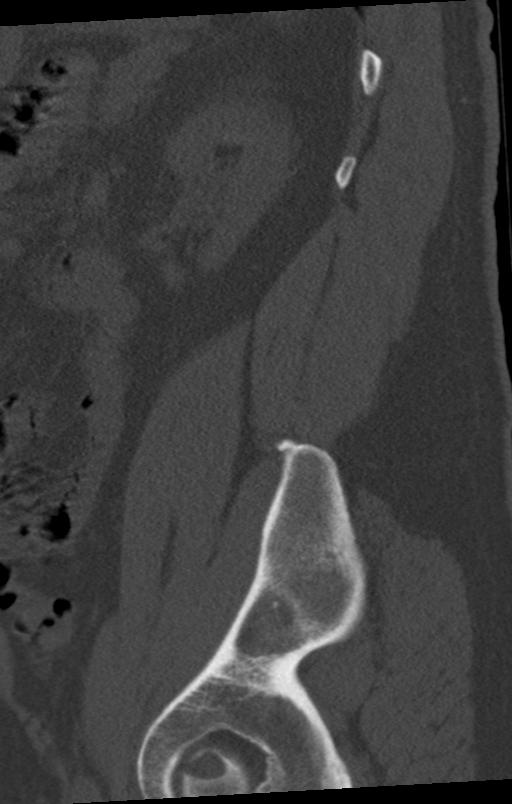

[Series 7: coronal bone · coronal · 0.40mm/px · 3 of 92 slices shown]
[im 19/92  bone]
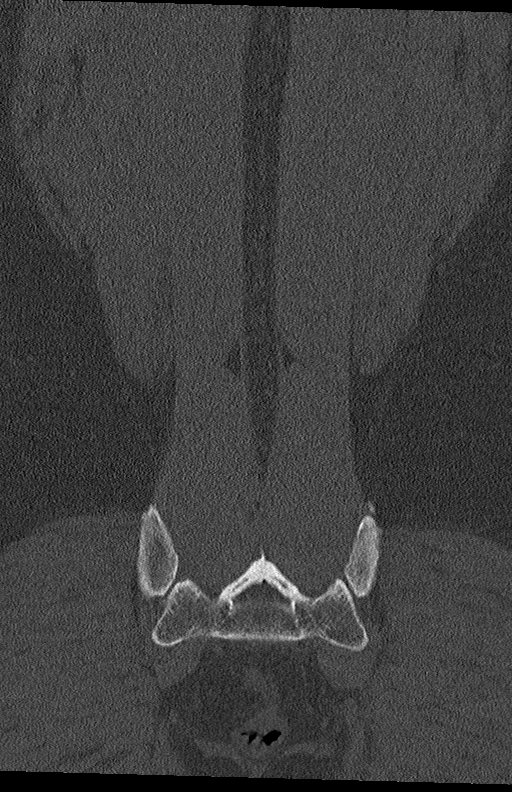
[im 37/92  bone]
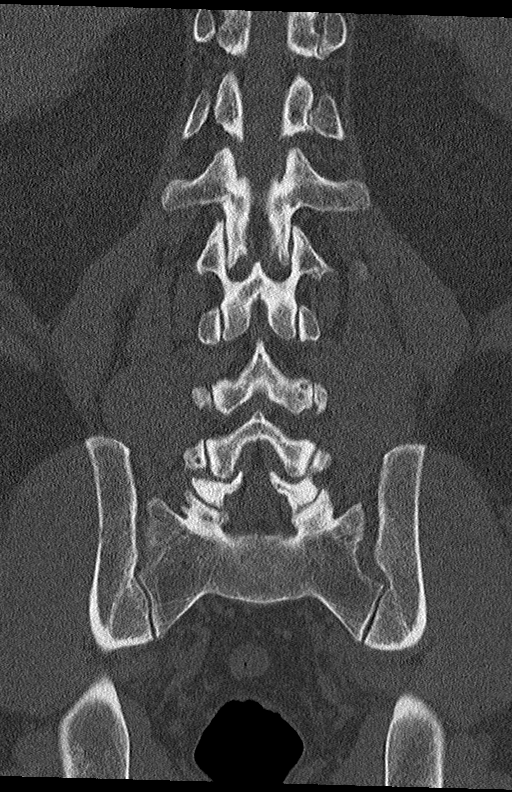
[im 55/92  bone]
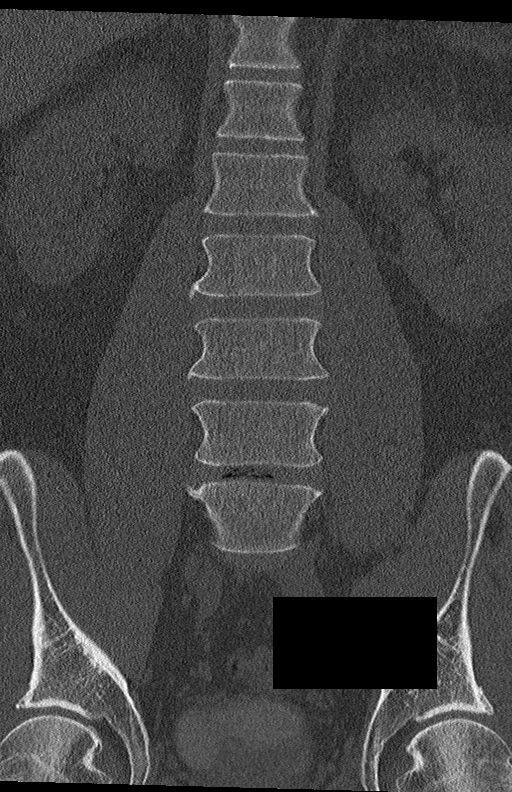

[12 of 33 positions shown; findings below may reference images not displayed]

FINDINGS: CT CERVICAL SPINE FINDINGS

Alignment: Straightening of the normal cervical lordosis likely due
to positioning and degenerative changes as well as surgical changes.
Otherwise normal.

Skull base and vertebrae: Anterior disc fusion at the C5-C6 level.
Redemonstration of similar multilevel moderate severe degenerative
changes of the spine with osteophyte formation and uncovertebral
arthropathy. Multilevel severe osseous neural foraminal stenosis. No
significant osseous central canal stenosis. No acute fracture. No
aggressive appearing focal osseous lesion or focal pathologic
process.

Soft tissues and spinal canal: No prevertebral fluid or swelling. No
visible canal hematoma.

Upper chest: Unremarkable.

Other: None.

CT THORACIC SPINE FINDINGS

Segmentation: 12 rib-bearing thoracic vertebral bodies.

Alignment: Normal.

Vertebrae: Mild multilevel osteophyte formation. No acute displaced
fracture. No suspicious lytic or blastic osseous lesion. No neural
foraminal central canal stenosis.

Paraspinal and other soft tissues: Negative.

Disc levels: Maintained

Other: None.

CT LUMBAR SPINE FINDINGS

Segmentation: 5 non-rib-bearing lumbar vertebral bodies.

Alignment: Slight straightening of the normal cervical lordosis
likely due to positioning.

Vertebrae: Mild L3 through L5 osteophyte formation. Mild L5-S1 facet
arthropathy. Posterior disc bulge at the L4-L5 level. No severe
osseous neural foraminal or central canal stenosis. No acute
displaced fracture. No suspicious lytic or blastic osseous lesions.

Paraspinal and other soft tissues: Negative.

Disc levels: Intervertebral disc space vacuum phenomenon at the
L4-L5 level.

Other: Visualized sacral spine unremarkable. Abdominal aorta
atherosclerotic plaque.
IMPRESSION: 1. No acute displaced fracture or traumatic listhesis of the
cervical, thoracic, lumbar spine.
2. Similar-appearing multilevel moderate to severe cervical spine
stenosis leading to multilevel severe osseous neural foraminal
stenosis.
3. Intervertebral disc space vacuum phenomenon and disc bulge at the
L4-L5 level.

## 2021-01-09 IMAGING — CT CT T SPINE W/O CM
3 of 4 series · 10 of 33 positions shown, 11 images · non-contrast
Comparison: CT cervical spine [DATE].

CLINICAL DATA: about a month ago coming in for a fall and being
diagnosed with a concussion. Pt states he now has a "pimp" walk to
the left leg, with left leg weakness.

EXAM:
CT CERVICAL, THORACIC, AND LUMBAR SPINE WITHOUT CONTRAST
TECHNIQUE: Multidetector CT imaging of the cervical, thoracic and lumbar spine
was performed without intravenous contrast. Multiplanar CT image
reconstructions were also generated.

[Series 5: sagittal bone · sagittal · 0.29mm/px · 5 of 76 slices shown]
[im 26/76  bone]
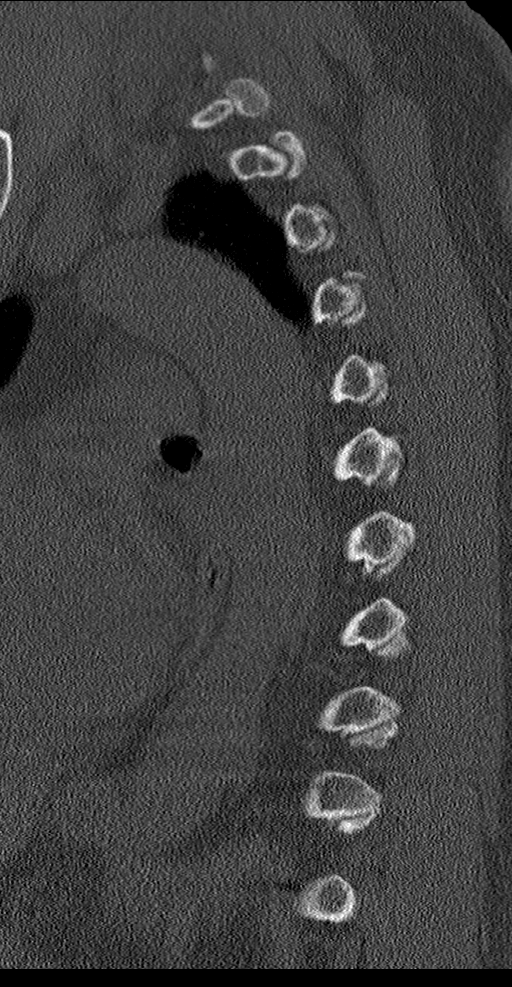
[im 32/76  bone]
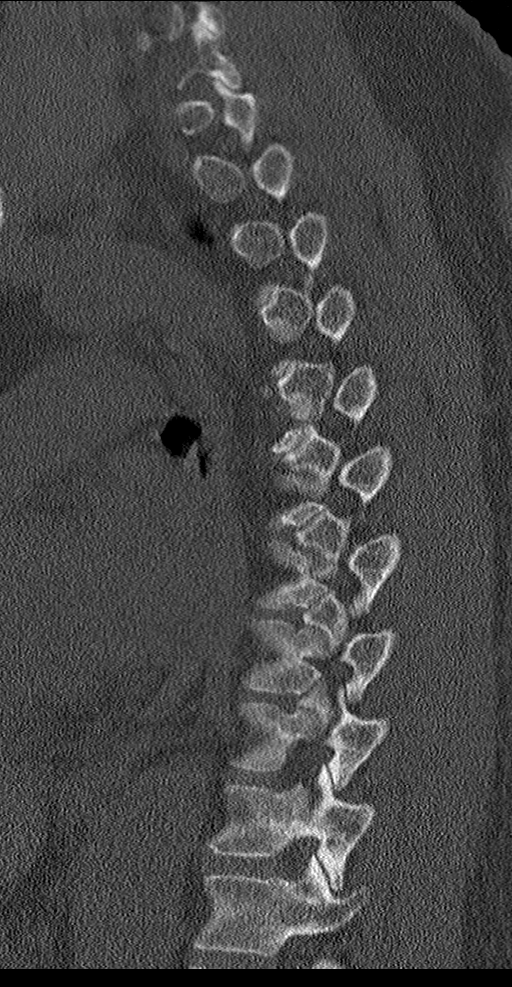
[im 38/76  bone]
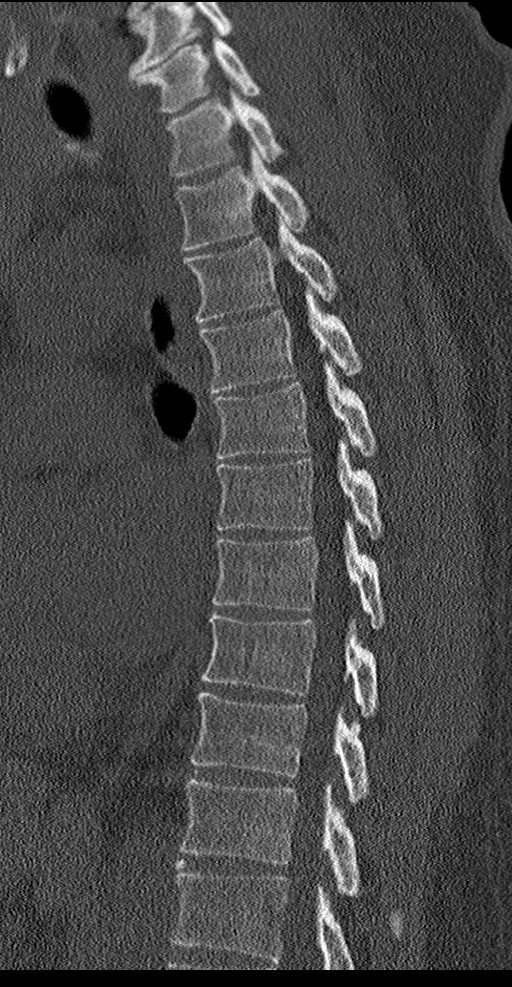
[im 44/76  bone]
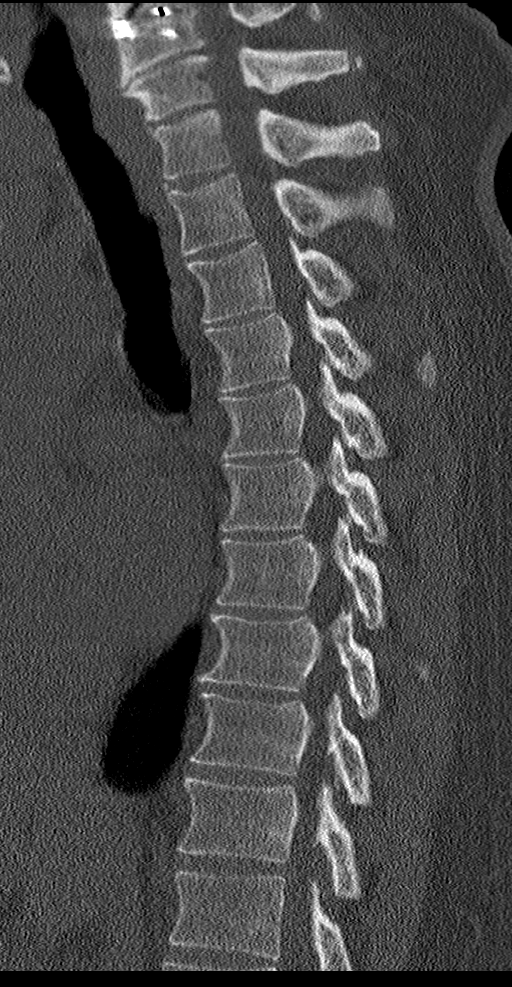
[im 51/76  bone]
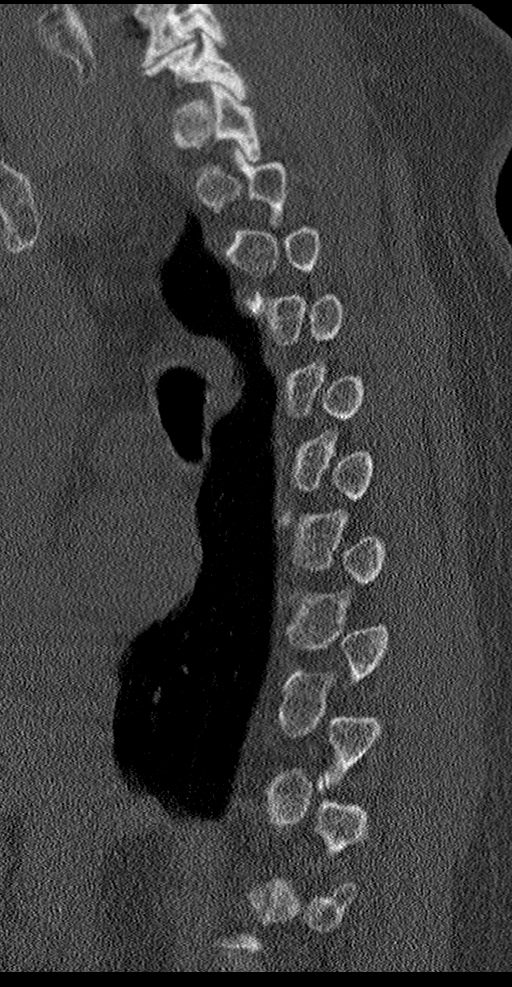

[Series 6: coronal bone · coronal · 0.29mm/px · 3 of 75 slices shown]
[im 15/75  bone]
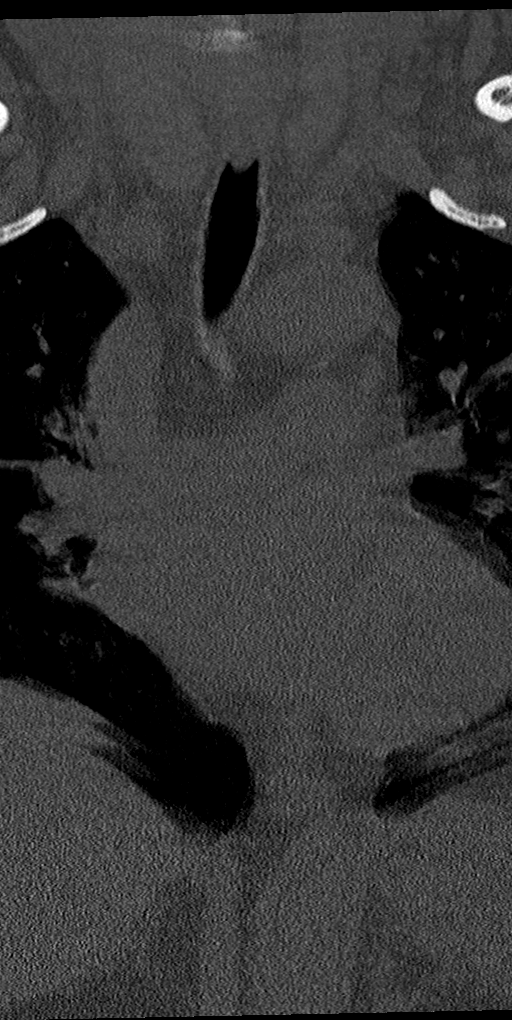
[im 30/75  bone]
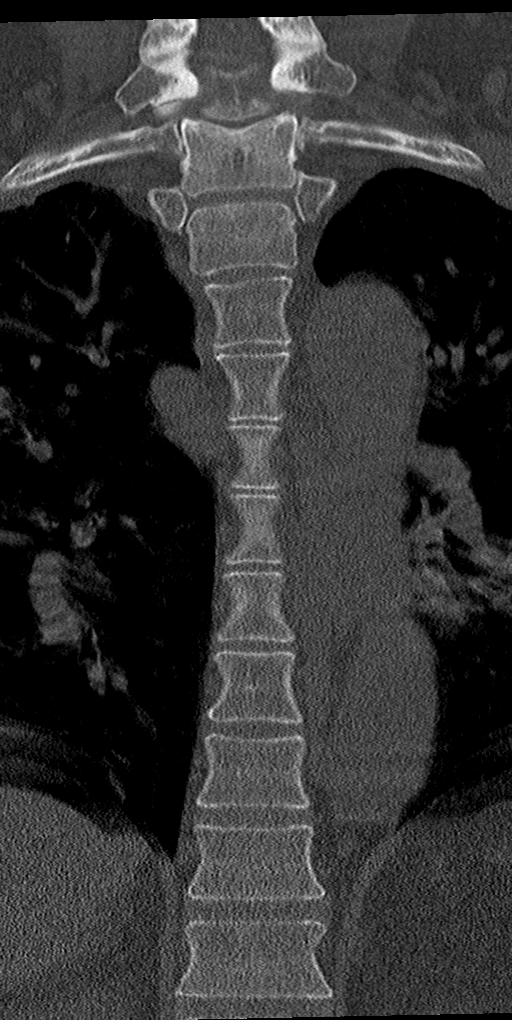
[im 45/75  bone]
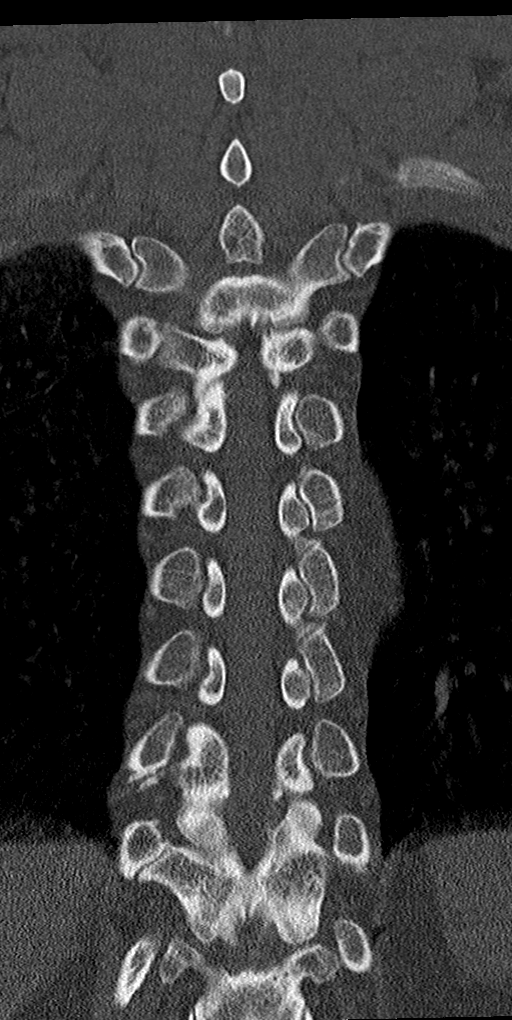

[Series 8: multidisc · axial · 0.21mm/px · z∈[-210,-126]mm · 2 of 142 slices shown, 3 images]
[im 48/142  soft-tissue]
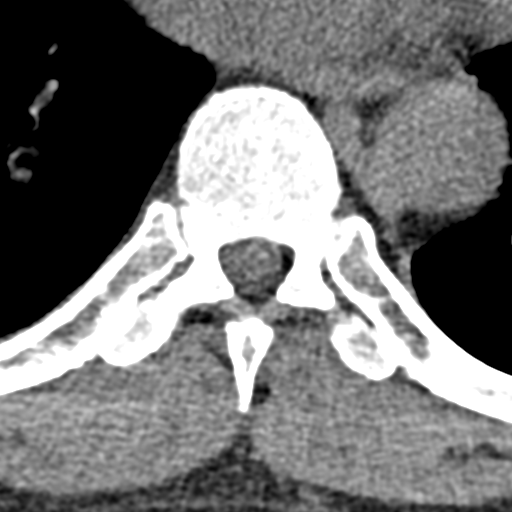
[im 48/142  bone]
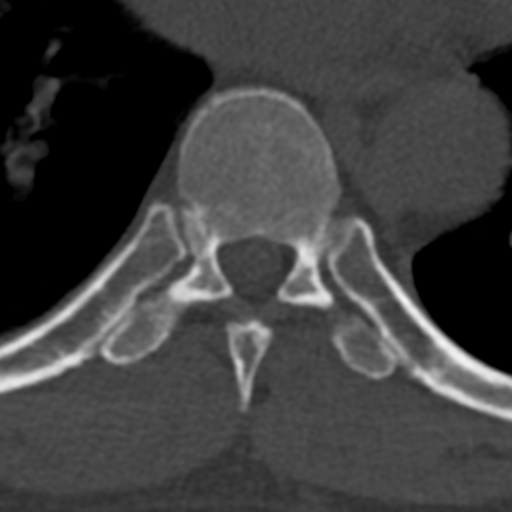
[im 95/142  bone]
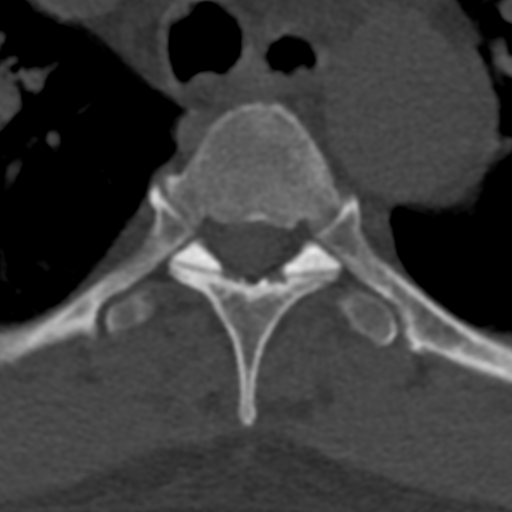

[10 of 33 positions shown; findings below may reference images not displayed]

FINDINGS: CT CERVICAL SPINE FINDINGS

Alignment: Straightening of the normal cervical lordosis likely due
to positioning and degenerative changes as well as surgical changes.
Otherwise normal.

Skull base and vertebrae: Anterior disc fusion at the C5-C6 level.
Redemonstration of similar multilevel moderate severe degenerative
changes of the spine with osteophyte formation and uncovertebral
arthropathy. Multilevel severe osseous neural foraminal stenosis. No
significant osseous central canal stenosis. No acute fracture. No
aggressive appearing focal osseous lesion or focal pathologic
process.

Soft tissues and spinal canal: No prevertebral fluid or swelling. No
visible canal hematoma.

Upper chest: Unremarkable.

Other: None.

CT THORACIC SPINE FINDINGS

Segmentation: 12 rib-bearing thoracic vertebral bodies.

Alignment: Normal.

Vertebrae: Mild multilevel osteophyte formation. No acute displaced
fracture. No suspicious lytic or blastic osseous lesion. No neural
foraminal central canal stenosis.

Paraspinal and other soft tissues: Negative.

Disc levels: Maintained

Other: None.

CT LUMBAR SPINE FINDINGS

Segmentation: 5 non-rib-bearing lumbar vertebral bodies.

Alignment: Slight straightening of the normal cervical lordosis
likely due to positioning.

Vertebrae: Mild L3 through L5 osteophyte formation. Mild L5-S1 facet
arthropathy. Posterior disc bulge at the L4-L5 level. No severe
osseous neural foraminal or central canal stenosis. No acute
displaced fracture. No suspicious lytic or blastic osseous lesions.

Paraspinal and other soft tissues: Negative.

Disc levels: Intervertebral disc space vacuum phenomenon at the
L4-L5 level.

Other: Visualized sacral spine unremarkable. Abdominal aorta
atherosclerotic plaque.
IMPRESSION: 1. No acute displaced fracture or traumatic listhesis of the
cervical, thoracic, lumbar spine.
2. Similar-appearing multilevel moderate to severe cervical spine
stenosis leading to multilevel severe osseous neural foraminal
stenosis.
3. Intervertebral disc space vacuum phenomenon and disc bulge at the
L4-L5 level.

## 2021-01-09 MED ORDER — SODIUM CHLORIDE 0.9% FLUSH
3.0000 mL | Freq: Two times a day (BID) | INTRAVENOUS | Status: DC
  Administered 2021-01-09 – 2021-01-10 (×3): 3 mL via INTRAVENOUS

## 2021-01-09 MED ORDER — ONDANSETRON HCL 4 MG PO TABS: 4.0000 mg | ORAL_TABLET | Freq: Four times a day (QID) | ORAL | Status: DC | PRN

## 2021-01-09 MED ORDER — ONDANSETRON HCL 4 MG/2ML IJ SOLN: 4.0000 mg | Freq: Four times a day (QID) | INTRAMUSCULAR | Status: DC | PRN

## 2021-01-09 MED ORDER — SODIUM CHLORIDE 0.9 % IV SOLN: 250.0000 mL | INTRAVENOUS | Status: DC | PRN

## 2021-01-09 MED ORDER — SODIUM CHLORIDE 0.9% FLUSH: 3.0000 mL | Freq: Two times a day (BID) | INTRAVENOUS | Status: DC

## 2021-01-09 MED ORDER — GADOBUTROL 1 MMOL/ML IV SOLN
8.0000 mL | Freq: Once | INTRAVENOUS | Status: AC | PRN
  Administered 2021-01-09: 8 mL via INTRAVENOUS

## 2021-01-09 MED ORDER — SODIUM CHLORIDE 0.9% FLUSH: 3.0000 mL | INTRAVENOUS | Status: DC | PRN

## 2021-01-09 NOTE — ED Notes (Signed)
This RN to bedside, introduced self to patient and SO. Pt resting in bed with NAD noted. Pt placed on cardiac monitor. Pt denies any needs at this time. Call bell within reach of patient, states understanding to use if further needs arise.

## 2021-01-09 NOTE — ED Notes (Signed)
Pt given phone to answer MRI screening questions.  

## 2021-01-09 NOTE — Consult Note (Signed)
NEUROLOGY CONSULTATION NOTE   Date of service: January 09, 2021 Patient Name: Tyler Winters MRN:  [id number] DOB:  [birth year] Reason for consult: "left leg weakness" _ _ _   _ __   _ __ _ _  __ __   _ __   __ _  History of Present Illness  Tyler Winters is a 55 y.o. male with PMH significant for spine DJD s/p cervical fusion p/w left leg weakness after a fall on 12/09/20. He was at work, walked outside and the ground was frozen. He slipped, fell backward and hit his head on concrete. Attempted 3 times but was unable to get up as ground was slippery. Rolled over to the dry part and then was able to get up and walk back into the warehouse. Seemed fine initially but noted tightness in both legs with left worse than right. He gets up in the morning and feels like his legs are sore. He sleeps well but wife reports that he looks restless in his sleep and moving around. His leg weakness has worsened over the last couple of weeks. Now everytime he goes up the stairs, his left foot touches the top the stairs and sometimes doesn't clear it. Also endorses some urinary urgency. He is unable to feel when his bladder is full, all of a suddn out of nowhere will get a sensation that he needs to go now and cannot hold it in for a reasonable amount of time. Strong urinary stream and able to completely empty his bladder. No constipation. Endorses sexual dysfunction. No saddle anesthesia. Does get a shock sensation running down into his shoulders.  Workup with vitals normal, CBC and chemistry with no significant abnormality.  CT C,T,L spine w/o contrast with no fracture and noted similar appearing multilevel moderate to severe cervical spine stenosis.  MRI of the brain with and without contrast and MR angio head without contrast with no significant abnormality.   ROS   Constitutional Denies weight loss, fever and chills.   HEENT Denies changes in vision and hearing.   Respiratory Denies SOB and cough.    CV Denies palpitations and CP   GI Denies abdominal pain, nausea, vomiting and diarrhea.   GU + dysuria and urinary frequency.   MSK + myalgia and joint pain.  Skin Denies rash and pruritus.  Neurological Denies headache and syncope.  Psychiatric Denies recent changes in mood. Denies anxiety and depression.   Past History   Hx of Cervical DJD s/p fusion. No family history on file. Social History   Socioeconomic History  . Marital status: Married    Spouse name: Not on file  . Number of children: Not on file  . Years of education: Not on file  . Highest education level: Not on file  Occupational History  . Not on file  Tobacco Use  . Smoking status: Current Every Day Smoker    Types: Cigarettes  . Smokeless tobacco: Never Used  Substance and Sexual Activity  . Alcohol use: Not on file  . Drug use: Not on file  . Sexual activity: Not on file  Other Topics Concern  . Not on file  Social History Narrative  . Not on file   Social Determinants of Health   Financial Resource Strain: Not on file  Food Insecurity: Not on file  Transportation Needs: Not on file  Physical Activity: Not on file  Stress: Not on file  Social Connections: Not on file   Allergies  Allergen  Reactions  . Morphine And Related Itching    Medications  (Not in a hospital admission)    Vitals   Vitals:   01/09/21 0500 01/09/21 0545 01/09/21 0808 01/09/21 0816  BP: 129/86 (!) 134/91 (!) 143/100 (!) 140/96  Pulse: (!) 56 (!) 52 71 (!) 58  Resp: 15 15 17  (!) 26  Temp:   98.1 F (36.7 C)   TempSrc:   Oral   SpO2: 96% 96% 97% 97%  Weight:      Height:         Body mass index is 31.8 kg/m.  Physical Exam   General: Laying comfortably in bed; in no acute distress. HENT: Normal oropharynx and mucosa. Normal external appearance of ears and nose.  Neck: Supple, no pain or tenderness  CV: No JVD. No peripheral edema.  Pulmonary: Symmetric Chest rise. Normal respiratory effort.  Abdomen:  Soft to touch, non-tender.  Ext: No cyanosis, edema, or deformity  Skin: No rash. Normal palpation of skin.   Musculoskeletal: Normal digits and nails by inspection. No clubbing.   Neurologic Examination  Mental status/Cognition: Alert, oriented to self, place, month and year, good attention.  Speech/language: Fluent, comprehension intact, object naming intact, repetition intact.  Cranial nerves:   CN II Pupils equal and reactive to light, no VF deficits    CN III,IV,VI EOM intact, no gaze preference or deviation, no nystagmus    CN V normal sensation in V1, V2, and V3 segments bilaterally    CN VII no asymmetry, no nasolabial fold flattening    CN VIII normal hearing to speech    CN IX & X normal palatal elevation, no uvular deviation    CN XI 5/5 head turn and 5/5 shoulder shrug bilaterally    CN XII midline tongue protrusion    Motor:  Muscle bulk: normal , tone , pronator drift  tremor  Mvmt Root Nerve  Muscle Right Left Comments  SA C5/6 Ax Deltoid 4 5   EF C5/6 Mc Biceps 4+ 5   EE C6/7/8 Rad Triceps 5 5   WF C6/7 Med FCR 4 5   WE C7/8 PIN ECU 2 5   F Ab C8/T1 U ADM/FDI 5 5   HF L1/2/3 Fem Illopsoas 4 4   KE L2/3/4 Fem Quad 5 5   DF L4/5 D Peron Tib Ant 5 4+   PF S1/2 Tibial Grc/Sol 5 5    Reflexes:  Right Left Comments  Pectoralis      Biceps (C5/6) 2 2   Brachioradialis (C5/6) 2 2    Triceps (C6/7) 2 2    Patellar (L3/4) 3 3 Cross adductors +   Achilles (S1) 4 4 Sustained clonus in [place], 4-5 beats in R ankle.   Hoffman + +    Plantar withdraws withdraws   Jaw jerk    Sensation:  Light touch Intact thourghout   Pin prick Intact throughout   Temperature    Vibration   Proprioception    Coordination/Complex Motor:  - Finger to Nose intact BL - Heel to shin with ataxia in LLE. - Rapid alternating movement are slowed in LLE. - Gait: Deferred.  Labs   CBC:  Recent Labs  Lab 01/08/21 1926  WBC 4.6  HGB 14.4  HCT 43.5  MCV 89.7  PLT 252    Basic  Metabolic Panel:  Lab Results  Component Value Date   NA 141 01/08/2021   K 3.9 01/08/2021   CO2 24 01/08/2021  GLUCOSE 99 01/08/2021   BUN 12 01/08/2021   CREATININE 0.87 01/08/2021   CALCIUM 8.9 01/08/2021   GFRNONAA >60 01/08/2021   Lipid Panel: No results found for: LDLCALC HgbA1c: No results found for: HGBA1C Urine Drug Screen: No results found for: LABOPIA, COCAINSCRNUR, LABBENZ, AMPHETMU, THCU, LABBARB  Alcohol Level No results found for: ETH  CT Head without contrast: CTH was negative for a large hypodensity concerning for a large territory infarct or hyperdensity concerning for an ICH  CT C,T,L spine: 1. No acute displaced fracture or traumatic listhesis of the cervical, thoracic, lumbar spine. 2. 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 3. Intervertebral disc space vacuum phenomenon and disc bulge at the L4-L5 level.  MRI Brain  No acute abnormality.  Impression   Tyler Winters is a 55 y.o. male with PMH significant for Cervical DJD s/p fusion who presents with worsening LLE weakness and tightness with urinary urgency and sexual dysfunction that has been progressively getting worse after a fall where he hit his head on concrete. Endorses some mild R arm weakness 2/2 cervical DJD but never had any problems with his legs. Exam with RUE weakness that he reports is not worse than his baseline but in addition has BL hip flexion weakness and LLE dorsiflexion weakness with diffuse hyperreflexia and LLE incoordination. No sensory deficit. Althou not classic and some of his hyperreflexia maybe residual from prior cervical cord injury but we have no notes from prior to see if that is the case. With him reporting worsening symptoms, I am going to get a STAT MRI C,T,L spine without contrast to assess for cord compression.  Recommendations  - MRI C,T,L spine without contrast STAT.  RN, Primary team and MRI team notified. ______________________________________________________________________  This patient is critically ill and at significant risk of neurological worsening, death and care requires constant monitoring of vital signs, hemodynamics,respiratory and cardiac monitoring, neurological assessment, discussion with family, other specialists and medical decision making of high complexity. I spent 35 minutes of neurocritical care time  in the care of  this patient. This was time spent independent of any time provided by nurse practitioner or PA.  Erick Blinks [facility] Pager Number [phone] 01/09/2021  9:36 AM   Thank you for the opportunity to take part in the care of this patient. If you have any further questions, please contact the neurology consultation attending.  Signed,  Erick Blinks [facility] Pager Number [phone] _ _ _   _ __   _ __ _ _  __ __   _ __   __ _

## 2021-01-09 NOTE — ED Provider Notes (Signed)
[facility] Emergency Department Provider Note  ____________________________________________  Time seen: Approximately 2:36 AM  I have reviewed the triage vital signs and the nursing notes.   HISTORY  Chief Complaint Numbness (Left Leg)   HPI Tyler Winters is a 55 y.o. male no significant past medical history who presents for evaluation of numbness of his left lower extremity.   Patient sustained a fall 4 weeks ago when he slipped on ice and fell backwards.  Positive LOC for about 3 minutes.  Patient was seen here that evening with CT head and cervical spine which were unremarkable and sent home.  According to patient and his wife, on the days following the fall patient developed a limp of his left lower extremity.  Patient reports that he feels that he is unable to control the leg.  He has been having difficulty walking since then.  He denies neck pain, back pain, hip pain, leg pain.  He reports that the whole leg is involved and that is how he wants from the beginning.  He denies any ascending symptoms.  He denies any prior history of back problems or surgeries, IV drug use, unintentional weight loss, fevers, saddle anesthesia, urinary or bowel incontinence or retention. Although patient does report that since the fall he feels that if he does not rush to the bathroom to urinate when he feels he has to go, he will not be able to hold and may become incontinent.  He denies any incontinence so far.  He feels that he is fully able to empty his bladder.  He denies any problems with bowel movements.  He also reports that since his symptoms started he has been able to have an erection but has been unable to ejaculate which is not normal for him.  He reports that his symptoms have gotten slightly worse over the course of the last 4 weeks.  PMH None - reviewed  Allergies Morphine and related  No family history on file.  Social History Social History   Tobacco  Use  . Smoking status: Current Every Day Smoker    Types: Cigarettes  . Smokeless tobacco: Never Used    Review of Systems  Constitutional: Negative for fever. Eyes: Negative for visual changes. ENT: Negative for sore throat. Neck: No neck pain  Cardiovascular: Negative for chest pain. Respiratory: Negative for shortness of breath. Gastrointestinal: Negative for abdominal pain, vomiting or diarrhea. Genitourinary: Negative for dysuria. Musculoskeletal: Negative for back pain. + LLE weakness Skin: Negative for rash. Neurological: Negative for headaches, weakness or numbness. Psych: No SI or HI  ____________________________________________   PHYSICAL EXAM:  VITAL SIGNS: ED Triage Vitals  Enc Vitals Group     BP 01/08/21 1915 (!) 152/105     Pulse Rate 01/08/21 1915 73     Resp 01/08/21 1915 18     Temp 01/08/21 1915 98.2 F (36.8 C)     Temp src --      SpO2 01/08/21 1915 97 %     Weight 01/08/21 1916 197 lb (89.4 kg)     Height 01/08/21 1916 5\' 6"  (1.676 m)     Head Circumference --      Peak Flow --      Pain Score 01/08/21 1916 0     Pain Loc --      Pain Edu? --      Excl. in GC? --     Constitutional: Alert and oriented. Well appearing and in no apparent  distress. HEENT:      Head: Normocephalic and atraumatic.         Eyes: Conjunctivae are normal. Sclera is non-icteric.       Mouth/Throat: Mucous membranes are moist.       Neck: Supple with no signs of meningismus. Cardiovascular: Regular rate and rhythm. No murmurs, gallops, or rubs. 2+ symmetrical distal pulses are present in all extremities. No JVD. Respiratory: Normal respiratory effort. Lungs are clear to auscultation bilaterally.  Gastrointestinal: Soft, non tender. Musculoskeletal: LLE is warm and well perfused with no swelling, full painless ROM of all joints, no bony tenderness, 2+ PT and DP pulses. No midline C/T/L spine tenderness Neurologic: 2+ patella and achilles DTRs bilaterally, intact  sensation, 5/5 strength of all muscles other than hip flexors which patient has 4/5 strength. Patient's gait is abnormal, wide and patient has difficulty picking up his leg. Skin: Skin is warm, dry and intact. No rash noted. Psychiatric: Mood and affect are normal. Speech and behavior are normal.  ____________________________________________   LABS (all labs ordered are listed, but only abnormal results are displayed)  Labs Reviewed  CBC  BASIC METABOLIC PANEL   ____________________________________________  EKG  ED ECG REPORT I, Nita Sicklearolina Jette Lewan, the attending physician, personally viewed and interpreted this ECG.  Normal sinus rhythm, rate of 64, LVH, normal axis, normal intervals, no ST elevations.  No prior for comparison. ____________________________________________  RADIOLOGY  I have personally reviewed the images performed during this visit and I agree with the Radiologist's read.   Interpretation by Radiologist:  CT Cervical Spine Wo Contrast  Result Date: 01/09/2021 CLINICAL DATA:  about a month ago coming in for a fall and being diagnosed with a concussion. Pt states he now has a "pimp" walk to the left leg, with left leg weakness. EXAM: CT CERVICAL, THORACIC, AND LUMBAR SPINE WITHOUT CONTRAST TECHNIQUE: Multidetector CT imaging of the cervical, thoracic and lumbar spine was performed without intravenous contrast. Multiplanar CT image reconstructions were also generated. COMPARISON:  CT cervical spine 12/09/2020. FINDINGS: CT CERVICAL SPINE FINDINGS Alignment: Straightening of the normal cervical lordosis likely due to positioning and degenerative changes as well as surgical changes. Otherwise normal. Skull base and vertebrae: Anterior disc fusion at the C5-C6 level. Redemonstration of similar multilevel moderate severe degenerative changes of the spine with osteophyte formation and uncovertebral arthropathy. Multilevel severe osseous neural foraminal stenosis. No significant  osseous central canal stenosis. No acute fracture. No aggressive appearing focal osseous lesion or focal pathologic process. Soft tissues and spinal canal: No prevertebral fluid or swelling. No visible canal hematoma. Upper chest: Unremarkable. Other: None. CT THORACIC SPINE FINDINGS Segmentation: 12 rib-bearing thoracic vertebral bodies. Alignment: Normal. Vertebrae: Mild multilevel osteophyte formation. No acute displaced fracture. No suspicious lytic or blastic osseous lesion. No neural foraminal central canal stenosis. Paraspinal and other soft tissues: Negative. Disc levels: Maintained Other: None. CT LUMBAR SPINE FINDINGS Segmentation: 5 non-rib-bearing lumbar vertebral bodies. Alignment: Slight straightening of the normal cervical lordosis likely due to positioning. Vertebrae: Mild L3 through L5 osteophyte formation. Mild L5-S1 facet arthropathy. Posterior disc bulge at the L4-L5 level. No severe osseous neural foraminal or central canal stenosis. No acute displaced fracture. No suspicious lytic or blastic osseous lesions. Paraspinal and other soft tissues: Negative. Disc levels: Intervertebral disc space vacuum phenomenon at the L4-L5 level. Other: Visualized sacral spine unremarkable. Abdominal aorta atherosclerotic plaque. IMPRESSION: 1. No acute displaced fracture or traumatic listhesis of the cervical, thoracic, lumbar spine. 2. Similar-appearing multilevel moderate to severe cervical spine  stenosis leading to multilevel severe osseous neural foraminal stenosis. 3. Intervertebral disc space vacuum phenomenon and disc bulge at the L4-L5 level. Electronically Signed   By: Tish Frederickson M.D.   On: 01/09/2021 06:58   CT Thoracic Spine Wo Contrast  Result Date: 01/09/2021 CLINICAL DATA:  about a month ago coming in for a fall and being diagnosed with a concussion. Pt states he now has a "pimp" walk to the left leg, with left leg weakness. EXAM: CT CERVICAL, THORACIC, AND LUMBAR SPINE WITHOUT CONTRAST  TECHNIQUE: Multidetector CT imaging of the cervical, thoracic and lumbar spine was performed without intravenous contrast. Multiplanar CT image reconstructions were also generated. COMPARISON:  CT cervical spine 12/09/2020. FINDINGS: CT CERVICAL SPINE FINDINGS Alignment: Straightening of the normal cervical lordosis likely due to positioning and degenerative changes as well as surgical changes. Otherwise normal. Skull base and vertebrae: Anterior disc fusion at the C5-C6 level. Redemonstration of similar multilevel moderate severe degenerative changes of the spine with osteophyte formation and uncovertebral arthropathy. Multilevel severe osseous neural foraminal stenosis. No significant osseous central canal stenosis. No acute fracture. No aggressive appearing focal osseous lesion or focal pathologic process. Soft tissues and spinal canal: No prevertebral fluid or swelling. No visible canal hematoma. Upper chest: Unremarkable. Other: None. CT THORACIC SPINE FINDINGS Segmentation: 12 rib-bearing thoracic vertebral bodies. Alignment: Normal. Vertebrae: Mild multilevel osteophyte formation. No acute displaced fracture. No suspicious lytic or blastic osseous lesion. No neural foraminal central canal stenosis. Paraspinal and other soft tissues: Negative. Disc levels: Maintained Other: None. CT LUMBAR SPINE FINDINGS Segmentation: 5 non-rib-bearing lumbar vertebral bodies. Alignment: Slight straightening of the normal cervical lordosis likely due to positioning. Vertebrae: Mild L3 through L5 osteophyte formation. Mild L5-S1 facet arthropathy. Posterior disc bulge at the L4-L5 level. No severe osseous neural foraminal or central canal stenosis. No acute displaced fracture. No suspicious lytic or blastic osseous lesions. Paraspinal and other soft tissues: Negative. Disc levels: Intervertebral disc space vacuum phenomenon at the L4-L5 level. Other: Visualized sacral spine unremarkable. Abdominal aorta atherosclerotic plaque.  IMPRESSION: 1. No acute displaced fracture or traumatic listhesis of the cervical, thoracic, lumbar spine. 2. 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 3. Intervertebral disc space vacuum phenomenon and disc bulge at the L4-L5 level. Electronically Signed   By: Tish Frederickson M.D.   On: 01/09/2021 06:58   CT Lumbar Spine Wo Contrast  Result Date: 01/09/2021 CLINICAL DATA:  about a month ago coming in for a fall and being diagnosed with a concussion. Pt states he now has a "pimp" walk to the left leg, with left leg weakness. EXAM: CT CERVICAL, THORACIC, AND LUMBAR SPINE WITHOUT CONTRAST TECHNIQUE: Multidetector CT imaging of the cervical, thoracic and lumbar spine was performed without intravenous contrast. Multiplanar CT image reconstructions were also generated. COMPARISON:  CT cervical spine 12/09/2020. FINDINGS: CT CERVICAL SPINE FINDINGS Alignment: Straightening of the normal cervical lordosis likely due to positioning and degenerative changes as well as surgical changes. Otherwise normal. Skull base and vertebrae: Anterior disc fusion at the C5-C6 level. Redemonstration of similar multilevel moderate severe degenerative changes of the spine with osteophyte formation and uncovertebral arthropathy. Multilevel severe osseous neural foraminal stenosis. No significant osseous central canal stenosis. No acute fracture. No aggressive appearing focal osseous lesion or focal pathologic process. Soft tissues and spinal canal: No prevertebral fluid or swelling. No visible canal hematoma. Upper chest: Unremarkable. Other: None. CT THORACIC SPINE FINDINGS Segmentation: 12 rib-bearing thoracic vertebral bodies.  Alignment: Normal. Vertebrae: Mild multilevel osteophyte formation. No acute displaced fracture. No suspicious lytic or blastic osseous lesion. No neural foraminal central canal stenosis. Paraspinal and other soft  tissues: Negative. Disc levels: Maintained Other: None. CT LUMBAR SPINE FINDINGS Segmentation: 5 non-rib-bearing lumbar vertebral bodies. Alignment: Slight straightening of the normal cervical lordosis likely due to positioning. Vertebrae: Mild L3 through L5 osteophyte formation. Mild L5-S1 facet arthropathy. Posterior disc bulge at the L4-L5 level. No severe osseous neural foraminal or central canal stenosis. No acute displaced fracture. No suspicious lytic or blastic osseous lesions. Paraspinal and other soft tissues: Negative. Disc levels: Intervertebral disc space vacuum phenomenon at the L4-L5 level. Other: Visualized sacral spine unremarkable. Abdominal aorta atherosclerotic plaque. IMPRESSION: 1. No acute displaced fracture or traumatic listhesis of the cervical, thoracic, lumbar spine. 2. 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 3. Intervertebral disc space vacuum phenomenon and disc bulge at the L4-L5 level. Electronically Signed   By: Tish Frederickson M.D.   On: 01/09/2021 06:58   MR ANGIO HEAD WO CONTRAST  Result Date: 01/09/2021 CLINICAL DATA:  Left lower extremity numbness. Possible recent stroke. EXAM: MRI HEAD WITHOUT CONTRAST MRA HEAD WITHOUT CONTRAST TECHNIQUE: Multiplanar, multiecho pulse sequences of the brain and surrounding structures were obtained without intravenous contrast. Angiographic images of the head were obtained using MRA technique without contrast. COMPARISON:  None. FINDINGS: MRI HEAD FINDINGS Brain: No acute infarct, mass effect or extra-axial collection. No acute or chronic hemorrhage. Normal white matter signal, parenchymal volume and CSF spaces. The midline structures are normal. Vascular: Major flow voids are preserved. Skull and upper cervical spine: Normal calvarium and skull base. Visualized upper cervical spine and soft tissues are normal. Sinuses/Orbits:No paranasal sinus  fluid levels or advanced mucosal thickening. No mastoid or middle ear effusion. Normal orbits. MRA HEAD FINDINGS POSTERIOR CIRCULATION: --Vertebral arteries: Normal --Inferior cerebellar arteries: Normal. --Basilar artery: Normal. --Superior cerebellar arteries: Normal. --Posterior cerebral arteries: Normal. ANTERIOR CIRCULATION: --Intracranial internal carotid arteries: Normal. --Anterior cerebral arteries (ACA): Normal. --Middle cerebral arteries (MCA): Normal. ANATOMIC VARIANTS: None IMPRESSION: Normal MRI/MRA of the brain. Electronically Signed   By: Deatra Robinson M.D.   On: 01/09/2021 02:25   MR Brain W and Wo Contrast  Result Date: 01/09/2021 CLINICAL DATA:  Left lower extremity numbness. Possible recent stroke. EXAM: MRI HEAD WITHOUT CONTRAST MRA HEAD WITHOUT CONTRAST TECHNIQUE: Multiplanar, multiecho pulse sequences of the brain and surrounding structures were obtained without intravenous contrast. Angiographic images of the head were obtained using MRA technique without contrast. COMPARISON:  None. FINDINGS: MRI HEAD FINDINGS Brain: No acute infarct, mass effect or extra-axial collection. No acute or chronic hemorrhage. Normal white matter signal, parenchymal volume and CSF spaces. The midline structures are normal. Vascular: Major flow voids are preserved. Skull and upper cervical spine: Normal calvarium and skull base. Visualized upper cervical spine and soft tissues are normal. Sinuses/Orbits:No paranasal sinus fluid levels or advanced mucosal thickening. No mastoid or middle ear effusion. Normal orbits. MRA HEAD FINDINGS POSTERIOR CIRCULATION: --Vertebral arteries: Normal --Inferior cerebellar arteries: Normal. --Basilar artery: Normal. --Superior cerebellar arteries: Normal. --Posterior cerebral arteries: Normal. ANTERIOR CIRCULATION: --Intracranial internal carotid arteries: Normal. --Anterior cerebral arteries (ACA): Normal. --Middle cerebral arteries (MCA): Normal. ANATOMIC VARIANTS: None  IMPRESSION: Normal MRI/MRA of the brain. Electronically Signed   By: Deatra Robinson M.D.   On: 01/09/2021 02:25     ____________________________________________   PROCEDURES  Procedure(s) performed: None Procedures Critical Care performed:  None ____________________________________________  INITIAL IMPRESSION / ASSESSMENT AND PLAN / ED COURSE  55 y.o. male no significant past medical history who presents for evaluation of numbness of his left lower extremity x 4 weeks which started after a fall. On exam patient seems to have localized weakness of the hip flexors. Intact strength and sensation of other muscles on the LLE, no joint abnormality, normal vascular exam, normal DTRs. Since symptosm started after a fall with head injury, I sent patient for MRI of the head. I also added and MRA of the head due to FH of brain aneurysm. Both MRIs are negative. Neurology consulted. History gathered from patient and his wife who is at bedside.   _________________________ 3:38 AM on 01/09/2021 ----------------------------------------- Patient evaluated by teleneurologist Dr. Delane Ginger who recommended CT of the entire spine to rule out a traumatic fracture and if that is negative recommended admission for a inpatient neurology evaluation and MRI of the entire spine.  _________________________ 7:16 AM on 01/09/2021 -----------------------------------------  CTs of the spine have resulted now showing no signs of fracture or any other trauma to the spine.  Patient does have multilevel moderate to severe cervical spinal stenosis with neuroforaminal foraminal stenosis which is unchanged when compared to CT from one month ago. Will discuss with hospitalist for admission per Neurology recommendations.    _____________________________________________ Please note:  Patient was evaluated in Emergency Department today for the symptoms described in the history of present illness. Patient was evaluated in the context  of the global COVID-19 pandemic, which necessitated consideration that the patient might be at risk for infection with the SARS-CoV-2 virus that causes COVID-19. Institutional protocols and algorithms that pertain to the evaluation of patients at risk for COVID-19 are in a state of rapid change based on information released by regulatory bodies including the CDC and federal and state organizations. These policies and algorithms were followed during the patient's care in the ED.  Some ED evaluations and interventions may be delayed as a result of limited staffing during the pandemic.   [place] Controlled Substance Database was reviewed by me. ____________________________________________   FINAL CLINICAL IMPRESSION(S) / ED DIAGNOSES   Final diagnoses:  Weakness of left lower extremity      NEW MEDICATIONS STARTED DURING THIS VISIT:  ED Discharge Orders    None       Note:  This document was prepared using Dragon voice recognition software and may include unintentional dictation errors.    Don Perking, [place], MD 01/09/21 [phone]

## 2021-01-09 NOTE — ED Notes (Signed)
Pt transported to MRI 

## 2021-01-09 NOTE — Evaluation (Signed)
Occupational Therapy Evaluation Patient Details Name: Tyler Winters MRN: [id number] DOB: [birth year] Today's Date: 01/09/2021    History of Present Illness Tyler Winters is a 55yoM who [facility] on 2/[facility] after acute onset pimp-style walking of Left leg. Pt reports a fall 4WA on ice, posterior head impact, LOC x 3 mnutes. At that time, head and cervical CT negative. Pt reports recent increase urge incontinence of urine, denies any saddle anesthesia. Pt seen by neurology, who notes grossly hypereflexia, has ordered stat C/T/L MRI to r/o cord compression.   Clinical Impression    OT evaluation initiated and completed. Pt in bed with wife present in the room. Pt verbalizes being independent at home and working as a Advice worker. Pt demonstrated self care tasks and functional mobility with increased time and modification but no use of equipment needed. Pt does exhibit decreased gross strength and external rotation in R UE but it remains functional for all tasks. Pt does verbalize concerns regarding bladder and sexual dysfunction. OT did notify RN of concerns and recommended possible bladder scan for retension if team felt appropriate. Pt with no additional concerns at this time. OT to SIGN OFF. Thank you for this referral   Follow Up Recommendations  No OT follow up    Equipment Recommendations  None recommended by OT       Precautions / Restrictions Precautions Precautions: None Restrictions Weight Bearing Restrictions: No      Mobility Bed Mobility Overal bed mobility: Independent                  Transfers Overall transfer level: Modified independent Equipment used: None             General transfer comment: stiffness of legs upon standng, slow to rise, then able to proceed with AMB    Balance Overall balance assessment: Modified Independent                                         ADL either performed or assessed with clinical judgement    ADL Overall ADL's : Modified independent;At baseline                                       General ADL Comments: At baseline, pt able to perform all aspects of self care independently with modifications and increased time. Pt demonstrated these tasks in same manner today with no physical assist being needed     Vision Baseline Vision/History: No visual deficits Patient Visual Report: No change from baseline              Pertinent Vitals/Pain Pain Assessment: No/denies pain     Hand Dominance Right   Extremity/Trunk Assessment Upper Extremity Assessment Upper Extremity Assessment: Overall WFL for tasks assessed (no acute changes noted. decreased external rotation 4/5 R UE)   Lower Extremity Assessment Lower Extremity Assessment: Defer to PT evaluation LLE Deficits / Details: No anesthesia, paresthesia; <25% weakness of LLE in SLR; limted hamstrings length bilat ~110 degrees Rt, 105 degrees Lt (in 90/90 setup); Ankle 5/5 in 4 directions; moderately restricted Left hip IR/ER P/ROM          Cognition Arousal/Alertness: Awake/alert Behavior During Therapy: WFL for tasks assessed/performed Overall Cognitive Status: Within Functional Limits for tasks assessed  Home Living Family/patient expects to be discharged to:: Private residence Living Arrangements: Spouse/significant other Available Help at Discharge: Family;Available PRN/intermittently Type of Home: Apartment Home Access: Stairs to enter Entrance Stairs-Number of Steps: 1   Home Layout: Two level     Bathroom Shower/Tub: Runner, broadcasting/film/video: None          Prior Functioning/Environment Level of Independence: Independent                          OT Goals(Current goals can be found in the care plan section) Acute Rehab OT Goals Patient Stated Goal: questions regarding bladder, gait, and  ejaculation OT Goal Formulation: With patient Time For Goal Achievement: 01/24/21 Potential to Achieve Goals: Fair   AM-PAC OT "6 Clicks" Daily Activity     Outcome Measure Help from another person eating meals?: None Help from another person taking care of personal grooming?: None Help from another person toileting, which includes using toliet, bedpan, or urinal?: None Help from another person bathing (including washing, rinsing, drying)?: None Help from another person to put on and taking off regular upper body clothing?: None Help from another person to put on and taking off regular lower body clothing?: None 6 Click Score: 24   End of Session Nurse Communication: Mobility status;Other (comment) (bladder scan)  Activity Tolerance: Patient tolerated treatment well Patient left: in bed;with call bell/phone within reach;with family/visitor present                   Time: 1779-3903 OT Time Calculation (min): 33 min Charges:  OT General Charges $OT Visit: 1 Visit OT Evaluation $OT Eval Low Complexity: 1 Low OT Treatments $Therapeutic Activity: 8-22 mins  Jackquline Denmark, MS, OTR/L , CBIS ascom [phone]  01/09/21, 4:31 PM

## 2021-01-09 NOTE — ED Notes (Signed)
Pt transported to CT ?

## 2021-01-09 NOTE — ED Notes (Signed)
Teleneurologist on screen to assess pt. This RN at bedside to assist with exam.

## 2021-01-09 NOTE — Evaluation (Signed)
Physical Therapy Evaluation Patient Details Name: Tyler Winters MRN: [id number] DOB: 1966-10-05 Today's Date: 01/09/2021   History of Present Illness  Tyler Winters is a 55yoM who comes to [facility] on 2/17 after acute onset pimp-style walking of Left leg. Pt reports a fall 4WA on ice, posterior head impact, LOC x 3 mnutes. At that time, head and cervical CT negative. Pt reports recent increase urge incontinence of urine, denies any saddle anesthesia. Pt seen by neurology, who notes grossly hypereflexia, has ordered stat C/T/L MRI to r/o cord compression.  Clinical Impression  Pt admitted with above diagnosis. Pt currently with functional limitations due to the deficits listed below (see "PT Problem List"). Upon entry, pt in bed, awake and agreeable to participate. The pt is alert, pleasant, interactive, and able to provide info regarding prior level of function, both in tolerance and independence. Pt mentions upon waking each morning his anterior thighs feels similar to post-workout DOMS, equal phenomenon bilaterally without any of this distal the knees. Generally appears hypertonic and mildly spastic in quads, hamstrings, and posterior hip musculature.   Patient's performance this date reveals decreased fluidity, fluency in performing basic mobility required for performance of activities of daily living, most notably, AMB stairs, and transfers. Pt requires additional time, but no physical assistance, nor DME for safe participate. Pt well aware of impairments and good safety awareness and problem solving. Pt will benefit from skilled PT intervention to increase independence and safety with basic mobility in preparation for discharge to the venue listed below.       Follow Up Recommendations Follow surgeon's recommendation for DC plan and follow-up therapies (Will defer to neurology at this time as presentation is idiopathic at time of evaluation, prognosis is unknown.)    Equipment  Recommendations  None recommended by PT    Recommendations for Other Services       Precautions / Restrictions Precautions Precautions: None Restrictions Weight Bearing Restrictions: No      Mobility  Bed Mobility Overal bed mobility: Independent                  Transfers Overall transfer level: Modified independent Equipment used: None             General transfer comment: stiffness of legs upon standng, slow to rise, then able to proceed with AMB  Ambulation/Gait Ambulation/Gait assistance: Supervision Gait Distance (Feet): 350 Feet Assistive device: None   Gait velocity: 0.26m/s   General Gait Details: decreased inititation of Left swing phase, decreased knee flexion in left swing phase (nearly SLR), Rt vaulting to accomodate Left swing; no foot drop noted; has some visibl emyoclonus of LLE in swing phase inittially but none in exam (disipates after 87ft AMB.  Stairs            Wheelchair Mobility    Modified Rankin (Stroke Patients Only)       Balance Overall balance assessment: Modified Independent                                           Pertinent Vitals/Pain      Home Living Family/patient expects to be discharged to:: Private residence Living Arrangements: Spouse/significant other Available Help at Discharge: Family Type of Home: Apartment Home Access: Stairs to enter   Secretary/administrator of Steps: 1 Home Layout: Two level Home Equipment: None      Prior Function  Level of Independence: Independent               Hand Dominance   Dominant Hand: Right    Extremity/Trunk Assessment   Upper Extremity Assessment Upper Extremity Assessment: Overall WFL for tasks assessed (grossly syemmetrical, no acutre changes; 5/5 except for RUE GHJ external rotation 4-/5;)    Lower Extremity Assessment Lower Extremity Assessment: LLE deficits/detail LLE Deficits / Details: No anesthesia, paresthesia; <25%  weakness of LLE in SLR; limted hamstrings length bilat ~110 degrees Rt, 105 degrees Lt (in 90/90 setup); Ankle 5/5 in 4 directions; moderately restricted Left hip IR/ER P/ROM       Communication      Cognition Arousal/Alertness: Awake/alert Behavior During Therapy: WFL for tasks assessed/performed Overall Cognitive Status: Within Functional Limits for tasks assessed                                        General Comments      Exercises     Assessment/Plan    PT Assessment Patient needs continued PT services  PT Problem List Decreased activity tolerance;Decreased mobility;Impaired tone       PT Treatment Interventions Gait training;Stair training;Functional mobility training;Therapeutic activities;Therapeutic exercise;Balance training;Neuromuscular re-education;Patient/family education;Manual techniques    PT Goals (Current goals can be found in the Care Plan section)  Acute Rehab PT Goals Patient Stated Goal: resolve gait abnormality PT Goal Formulation: With patient Time For Goal Achievement: 01/23/21 Potential to Achieve Goals: Fair    Frequency Min 2X/week   Barriers to discharge        Co-evaluation               AM-PAC PT "6 Clicks" Mobility  Outcome Measure Help needed turning from your back to your side while in a flat bed without using bedrails?: None Help needed moving from lying on your back to sitting on the side of a flat bed without using bedrails?: None Help needed moving to and from a bed to a chair (including a wheelchair)?: None Help needed standing up from a chair using your arms (e.g., wheelchair or bedside chair)?: None Help needed to walk in hospital room?: None Help needed climbing 3-5 steps with a railing? : A Little 6 Click Score: 23    End of Session   Activity Tolerance: Patient tolerated treatment well Patient left: in bed;with family/visitor present;with call bell/phone within reach Nurse Communication: Mobility  status PT Visit Diagnosis: Difficulty in walking, not elsewhere classified (R26.2);Other symptoms and signs involving the nervous system (R29.898)    Time: 9604-5409 PT Time Calculation (min) (ACUTE ONLY): 19 min   Charges:   PT Evaluation $PT Eval Low Complexity: 1 Low          2:40 PM, 01/09/21 Rosamaria Lints, PT, DPT Physical Therapist - [facility]  [phone] (ASCOM)    Kennard Fildes C 01/09/2021, 2:36 PM

## 2021-01-09 NOTE — Consult Note (Addendum)
[facility] Tele[facility]urology Consult Services  Stat Consult  Date of Service:   01/09/2021 02:51:00  Diagnosis:     .  R53.1 - Weakness  Impression: 55 year old man who denies medical history with the exception of prior cervical fusion. Presents with complaints of worsening left lower extremity weakness after a fall 12/09/2020. He was initially seen in the emergency department after the fall with negative CTA of the head and cervical spine but reports initial subtle symptoms of "tightness" in his left leg that worsened about two weeks ago with weakness, dragging the leg while walking. He denies neck or back pain or radicular pain, but on directed questioning admits to urinary urgency and sexual dysfunction beginning after the fall. On examination, there is subtle left lower extremity weakness which appears to involve predominantly hip flexion. Per Dr. Don Perking, reflexes are normal. MRI brain with and without contrast and MRA head are normal.   Discussed with the emergency department provider, Dr. Don Perking. Based on symptoms reportedly beginning after a fall backward on the head, primary concern would be for cervical spine injury. Negative CT cervical spine initially in the emergency department 1/18 is reassuring. His report of worsening beginning one week after the fall is somewhat perplexing. Out of an abundance of caution, I have recommended repeating CT of the cervical spine STAT in the emergency department and, given symptoms mainly in the left leg, including thoracic and lumbar spine to exclude any traumatic lesions. If negative, would recommend admission for further workup with MRI of the spine ASAP and in-house neurology consultation  Our recommendations are outlined below.  CT C, T and L spine STAT  If negative recommend admission for further workup with MRI spine, ASAP.  Routine consultation with in-house neurology for further follow up and  recommendations   Metrics: [facility] Notification Time: 01/09/2021 02:41:22 Stamp Time: 01/09/2021 02:51:00 Callback Response Time: 01/09/2021 02:44:52   ----------------------------------------------------------------------------------------------------  Chief Complaint: left leg weakness  History of Present Illness: Patient is a 55 year old Male.  55 year old man with history of prior cervical spine fusion presents with complaints of left leg weakness. He tells me that his symptoms initially began after a fall. On January 18 he was at work and slipped on the ice, fell backward landing on his head and was unconscious briefly. He came to the emergency department where he had CT of the head and cervical spine, both negative for acute processes. He says that following the fall he noticed symptoms in his left leg. At first he thought it was related to the cold temperatures. He felt like his left leg was stiff. About a week later, his symptoms got worse, he noticed that he was dragging his left leg while walking. On directed questioning, he denies any pain in the neck or back, pain radiating down the leg. He does admit to urinary urgency but has not had incontinence. He denies erectile dysfunction but has been unable to ejaculate. He has not had any symptoms in his arms with the exception of tingling in the fingertips when he moved his head rapidly when he "had a chill" on one occasion.   Past Medical History:     . There is NO history of Hypertension     . There is NO history of Hyperlipidemia     . Cervical spine fusion  Anticoagulant use:  No  Antiplatelet use: No   Examination: BP(137/91), Pulse(64), Blood Glucose(99)  Neuro Exam: General: Alert,Awake, Oriented to Time, Place, Person  Speech: Fluent:  Language: Intact:  Face: Symmetric:  Facial Sensation: Intact:  Visual Fields: Intact:  Extraocular Movements: Intact:  Sensation: Intact:  Coordination:  Intact:  Strength examination performed with the assistance of the bedside nurse. No drift in the bilateral upper extremities. Full grip strength. Dorsiflexion and plantarflexion full bilaterally. Subtle left lower extremity drift with 4/5 hip flexion on the left.    Patient / Family was informed the Neurology Consult would occur via TeleHealth consult by way of interactive audio and video telecommunications and consented to receiving care in this manner.  Patient is being evaluated for possible acute neurologic impairment and high probability of imminent or life - threatening deterioration.I spent total of 40 minutes providing care to this patient, including time for face to face visit via telemedicine, review of medical records, imaging studies and discussion of findings with providers, the patient and / or family.   Dr Luana Shu   [facility] [phone]  Case [id number]  ADDENDUM 01/09/2021  05:29am Followed up with Dr. Don Perking.  She tells me CT spine not yet done due to limited CT staff and multiple other emergent studies ordered on other patients.  Dr. Don Perking will be following up on CT results and will contact [facility] with any concerning results.  Reviewed recommendation that if CT negative, admission for further workup with MRI spine ASAP and in house neurology consultation.  -Luana Shu, D.O.  ADDENDUM 01/09/2021 07:55am CT C, T and L spine negative for fracture/dislocation.  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 Discussed with Dr. Don Perking. Recommend follow up with MRI ASAP. She indicates she has spoken with the hospitalists and in-house neurology has been consulted. She has placed orders for MRI. Will defer further follow up and recommendations to in house neurology.  -Luana Shu, D.O.

## 2021-01-09 NOTE — ED Notes (Signed)
Covid swab obtained by this RN. Pt tolerated well. Pt continues to rest in bed with NAD noted, wife remains at bedside, pt denies further needs, call bell remains at bedside at this time.

## 2021-01-09 NOTE — H&P (Signed)
History and Physical    Tyler Winters ZOX:[id number] DOB: 1966-07-27 DOA: 01/08/2021  PCP: Patient, No Pcp Per   Patient coming from: Home  I have personally briefly reviewed patient's old medical records in [facility]  Chief Complaint: Left leg weakness  HPI: Tyler Winters is a 55 y.o. male with no significant past medical history who presents to the emergency room for evaluation of numbness and weakness involving his left lower extremity.  Patient sustained a fall about 4 weeks ago when he slipped on ice and fell backwards hitting his head.  He lost consciousness for about 3 minutes.  He was transported to the hospital and had a CT scan of the head and cervical spine and he was discharged home.  According to the patient and his wife, some days following the fall patient developed a limp in his left leg which has progressively worsened to the point that he feels he is unable to control the.  He has had difficulty walking since then and this has progressively worsened.  Patient states that when he gets up from a sitting position he feels like the leg does not follow commands and he has to drag it while he walks.  He also has to hold onto surfaces to prevent him from falling.  He denies having any back pain.  He denies having any saddle anesthesia.  He denies having any bowel incontinence but states that he has noted that recently when he feels the urge to void he has to go to the bathroom right away before he has an accident on himself.  Patient also states that he is able to get an erection but has had trouble ejaculating since his accident. He has not had any further falls since his initial fall 4 weeks ago. He denies having any chest pain, no shortness of breath, no nausea, no vomiting, no abdominal pain, no dizziness, no lightheadedness, no headache, no cough, no fever, no chills, no urinary frequency, no nocturia, no dysuria, no blurred vision, no focal deficits Labs show  sodium 141, potassium 3.9, chloride 109, bicarb 24, glucose 99, BUN 12, creatinine 0.87, calcium 8.9, white count 4.6, hemoglobin 14.4, hematocrit 43.5, MCV 89.7, RDW 12.9, platelet count 252 Respiratory viral panel is negative CT scan of the cervical, thoracic and lumbar spine shows  No acute displaced fracture or traumatic listhesis of the cervical, thoracic, lumbar spine.Similar-appearing 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 Intervertebral disc space vacuum phenomenon and disc bulge at the L4-L5 level. Normal MRI/MRA of the brain Twelve-lead EKG reviewed by me shows normal sinus rhythm with sinus arrhythmia  ED Course: Patient is a 55 year old male who presents to the emergency room for evaluation of left leg weakness following a fall with loss of consciousness about 4 weeks ago.  Patient has had progressive weakness in his left leg associated with unsteady gait but denies having any further falls.  Neurology was consulted and MRI was recommended to rule out cord compression.  Patient will be admitted to the hospital for further evaluation.    Review of Systems: As per HPI otherwise all other systems reviewed and negative.    No past medical history on file.    reports that he has been smoking cigarettes. He has never used smokeless tobacco. He reports current alcohol use. He reports that he does not use drugs.  Allergies  Allergen Reactions  . Morphine And Related Itching  Family History  Problem Relation Age of Onset  . Hypertension Mother   . Hypertension Father       Prior to Admission medications   Not on File    Physical Exam: Vitals:   01/09/21 0545 01/09/21 0808 01/09/21 0816 01/09/21 0839  BP: (!) 134/91 (!) 143/100 (!) 140/96 (!) 142/97  Pulse: (!) 52 71 (!) 58 (!) 56  Resp: 15 17 (!) 26 16  Temp:  98.1 F (36.7 C)  97.6 F (36.4 C)  TempSrc:  Oral    SpO2: 96% 97% 97% 98%   Weight:      Height:         Vitals:   01/09/21 0545 01/09/21 0808 01/09/21 0816 01/09/21 0839  BP: (!) 134/91 (!) 143/100 (!) 140/96 (!) 142/97  Pulse: (!) 52 71 (!) 58 (!) 56  Resp: 15 17 (!) 26 16  Temp:  98.1 F (36.7 C)  97.6 F (36.4 C)  TempSrc:  Oral    SpO2: 96% 97% 97% 98%  Weight:      Height:          Constitutional: Alert and oriented x 3 . Not in any apparent distress HEENT:      Head: Normocephalic and atraumatic.         Eyes: PERLA, EOMI, Conjunctivae are normal. Sclera is non-icteric.       Mouth/Throat: Mucous membranes are moist.       Neck: Supple with no signs of meningismus. Cardiovascular: Regular rate and rhythm. No murmurs, gallops, or rubs. 2+ symmetrical distal pulses are present . No JVD. No LE edema Respiratory: Respiratory effort normal .Lungs sounds clear bilaterally. No wheezes, crackles, or rhonchi.  Gastrointestinal: Soft, non tender, and non distended with positive bowel sounds.  Central adiposity Genitourinary: No CVA tenderness. Musculoskeletal: Nontender with normal range of motion in all extremities. No cyanosis, or erythema of extremities. Neurologic:  Face is symmetric. Moving all extremities. No gross focal neurologic deficits.  2+ patella and achilles DTRs bilaterally, intact sensation, 5/5 strength of all muscles other than hip flexors which patient has 4/5 strength.  Pain in left thigh with straight leg raise.  Patient's gait is abnormal, wide and patient has difficulty picking up his leg. Skin: Skin is warm, dry.  No rash or ulcers Psychiatric: Mood and affect are normal   Labs on Admission: I have personally reviewed following labs and imaging studies  CBC: Recent Labs  Lab 01/08/21 1926  WBC 4.6  HGB 14.4  HCT 43.5  MCV 89.7  PLT 252   Basic Metabolic Panel: Recent Labs  Lab 01/08/21 1926  NA 141  K 3.9  CL 109  CO2 24  GLUCOSE 99  BUN 12  CREATININE 0.87  CALCIUM 8.9   GFR: Estimated Creatinine  Clearance: 102.8 mL/min (by C-G formula based on SCr of 0.87 mg/dL). Liver Function Tests: No results for input(s): AST, ALT, ALKPHOS, BILITOT, PROT, ALBUMIN in the last 168 hours. No results for input(s): LIPASE, AMYLASE in the last 168 hours. No results for input(s): AMMONIA in the last 168 hours. Coagulation Profile: No results for input(s): INR, PROTIME in the last 168 hours. Cardiac Enzymes: No results for input(s): CKTOTAL, CKMB, CKMBINDEX, TROPONINI in the last 168 hours. BNP (last 3 results) No results for input(s): PROBNP in the last 8760 hours. HbA1C: No results for input(s): HGBA1C in the last 72 hours. CBG: No results for input(s): GLUCAP in the last 168 hours. Lipid Profile: No results for input(s):  CHOL, HDL, LDLCALC, TRIG, CHOLHDL, LDLDIRECT in the last 72 hours. Thyroid Function Tests: No results for input(s): TSH, T4TOTAL, FREET4, T3FREE, THYROIDAB in the last 72 hours. Anemia Panel: No results for input(s): VITAMINB12, FOLATE, FERRITIN, TIBC, IRON, RETICCTPCT in the last 72 hours. Urine analysis: No results found for: COLORURINE, APPEARANCEUR, LABSPEC, PHURINE, GLUCOSEU, HGBUR, BILIRUBINUR, KETONESUR, PROTEINUR, UROBILINOGEN, NITRITE, LEUKOCYTESUR  Radiological Exams on Admission: CT Cervical Spine Wo Contrast  Result Date: 01/09/2021 CLINICAL DATA:  about a month ago coming in for a fall and being diagnosed with a concussion. Pt states he now has a "pimp" walk to the left leg, with left leg weakness. EXAM: CT CERVICAL, THORACIC, AND LUMBAR SPINE WITHOUT CONTRAST TECHNIQUE: Multidetector CT imaging of the cervical, thoracic and lumbar spine was performed without intravenous contrast. Multiplanar CT image reconstructions were also generated. COMPARISON:  CT cervical spine 12/09/2020. FINDINGS: CT CERVICAL SPINE FINDINGS Alignment: Straightening of the normal cervical lordosis likely due to positioning and degenerative changes as well as surgical changes. Otherwise normal.  Skull base and vertebrae: Anterior disc fusion at the C5-C6 level. Redemonstration of similar multilevel moderate severe degenerative changes of the spine with osteophyte formation and uncovertebral arthropathy. Multilevel severe osseous neural foraminal stenosis. No significant osseous central canal stenosis. No acute fracture. No aggressive appearing focal osseous lesion or focal pathologic process. Soft tissues and spinal canal: No prevertebral fluid or swelling. No visible canal hematoma. Upper chest: Unremarkable. Other: None. CT THORACIC SPINE FINDINGS Segmentation: 12 rib-bearing thoracic vertebral bodies. Alignment: Normal. Vertebrae: Mild multilevel osteophyte formation. No acute displaced fracture. No suspicious lytic or blastic osseous lesion. No neural foraminal central canal stenosis. Paraspinal and other soft tissues: Negative. Disc levels: Maintained Other: None. CT LUMBAR SPINE FINDINGS Segmentation: 5 non-rib-bearing lumbar vertebral bodies. Alignment: Slight straightening of the normal cervical lordosis likely due to positioning. Vertebrae: Mild L3 through L5 osteophyte formation. Mild L5-S1 facet arthropathy. Posterior disc bulge at the L4-L5 level. No severe osseous neural foraminal or central canal stenosis. No acute displaced fracture. No suspicious lytic or blastic osseous lesions. Paraspinal and other soft tissues: Negative. Disc levels: Intervertebral disc space vacuum phenomenon at the L4-L5 level. Other: Visualized sacral spine unremarkable. Abdominal aorta atherosclerotic plaque. IMPRESSION: 1. No acute displaced fracture or traumatic listhesis of the cervical, thoracic, lumbar spine. 2. 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 3. Intervertebral disc space vacuum phenomenon and disc bulge at the L4-L5 level. Electronically Signed   By: Tish FredericksonMorgane  Naveau M.D.   On: 01/09/2021 06:58   CT Thoracic  Spine Wo Contrast  Result Date: 01/09/2021 CLINICAL DATA:  about a month ago coming in for a fall and being diagnosed with a concussion. Pt states he now has a "pimp" walk to the left leg, with left leg weakness. EXAM: CT CERVICAL, THORACIC, AND LUMBAR SPINE WITHOUT CONTRAST TECHNIQUE: Multidetector CT imaging of the cervical, thoracic and lumbar spine was performed without intravenous contrast. Multiplanar CT image reconstructions were also generated. COMPARISON:  CT cervical spine 12/09/2020. FINDINGS: CT CERVICAL SPINE FINDINGS Alignment: Straightening of the normal cervical lordosis likely due to positioning and degenerative changes as well as surgical changes. Otherwise normal. Skull base and vertebrae: Anterior disc fusion at the C5-C6 level. Redemonstration of similar multilevel moderate severe degenerative changes of the spine with osteophyte formation and uncovertebral arthropathy. Multilevel severe osseous neural foraminal stenosis. No significant osseous central canal stenosis. No acute fracture. No aggressive appearing focal osseous lesion or focal pathologic  process. Soft tissues and spinal canal: No prevertebral fluid or swelling. No visible canal hematoma. Upper chest: Unremarkable. Other: None. CT THORACIC SPINE FINDINGS Segmentation: 12 rib-bearing thoracic vertebral bodies. Alignment: Normal. Vertebrae: Mild multilevel osteophyte formation. No acute displaced fracture. No suspicious lytic or blastic osseous lesion. No neural foraminal central canal stenosis. Paraspinal and other soft tissues: Negative. Disc levels: Maintained Other: None. CT LUMBAR SPINE FINDINGS Segmentation: 5 non-rib-bearing lumbar vertebral bodies. Alignment: Slight straightening of the normal cervical lordosis likely due to positioning. Vertebrae: Mild L3 through L5 osteophyte formation. Mild L5-S1 facet arthropathy. Posterior disc bulge at the L4-L5 level. No severe osseous neural foraminal or central canal stenosis. No  acute displaced fracture. No suspicious lytic or blastic osseous lesions. Paraspinal and other soft tissues: Negative. Disc levels: Intervertebral disc space vacuum phenomenon at the L4-L5 level. Other: Visualized sacral spine unremarkable. Abdominal aorta atherosclerotic plaque. IMPRESSION: 1. No acute displaced fracture or traumatic listhesis of the cervical, thoracic, lumbar spine. 2. 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 3. Intervertebral disc space vacuum phenomenon and disc bulge at the L4-L5 level. Electronically Signed   By: Tish Frederickson M.D.   On: 01/09/2021 06:58   CT Lumbar Spine Wo Contrast  Result Date: 01/09/2021 CLINICAL DATA:  about a month ago coming in for a fall and being diagnosed with a concussion. Pt states he now has a "pimp" walk to the left leg, with left leg weakness. EXAM: CT CERVICAL, THORACIC, AND LUMBAR SPINE WITHOUT CONTRAST TECHNIQUE: Multidetector CT imaging of the cervical, thoracic and lumbar spine was performed without intravenous contrast. Multiplanar CT image reconstructions were also generated. COMPARISON:  CT cervical spine 12/09/2020. FINDINGS: CT CERVICAL SPINE FINDINGS Alignment: Straightening of the normal cervical lordosis likely due to positioning and degenerative changes as well as surgical changes. Otherwise normal. Skull base and vertebrae: Anterior disc fusion at the C5-C6 level. Redemonstration of similar multilevel moderate severe degenerative changes of the spine with osteophyte formation and uncovertebral arthropathy. Multilevel severe osseous neural foraminal stenosis. No significant osseous central canal stenosis. No acute fracture. No aggressive appearing focal osseous lesion or focal pathologic process. Soft tissues and spinal canal: No prevertebral fluid or swelling. No visible canal hematoma. Upper chest: Unremarkable. Other: None. CT THORACIC SPINE  FINDINGS Segmentation: 12 rib-bearing thoracic vertebral bodies. Alignment: Normal. Vertebrae: Mild multilevel osteophyte formation. No acute displaced fracture. No suspicious lytic or blastic osseous lesion. No neural foraminal central canal stenosis. Paraspinal and other soft tissues: Negative. Disc levels: Maintained Other: None. CT LUMBAR SPINE FINDINGS Segmentation: 5 non-rib-bearing lumbar vertebral bodies. Alignment: Slight straightening of the normal cervical lordosis likely due to positioning. Vertebrae: Mild L3 through L5 osteophyte formation. Mild L5-S1 facet arthropathy. Posterior disc bulge at the L4-L5 level. No severe osseous neural foraminal or central canal stenosis. No acute displaced fracture. No suspicious lytic or blastic osseous lesions. Paraspinal and other soft tissues: Negative. Disc levels: Intervertebral disc space vacuum phenomenon at the L4-L5 level. Other: Visualized sacral spine unremarkable. Abdominal aorta atherosclerotic plaque. IMPRESSION: 1. No acute displaced fracture or traumatic listhesis of the cervical, thoracic, lumbar spine. 2. 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 3. Intervertebral disc space vacuum phenomenon and disc bulge at the L4-L5 level. Electronically Signed   By: Tish Frederickson M.D.   On: 01/09/2021 06:58   MR ANGIO HEAD WO CONTRAST  Result Date: 01/09/2021 CLINICAL DATA:  Left lower extremity  numbness. Possible recent stroke. EXAM: MRI HEAD WITHOUT CONTRAST MRA HEAD WITHOUT CONTRAST TECHNIQUE: Multiplanar, multiecho pulse sequences of the brain and surrounding structures were obtained without intravenous contrast. Angiographic images of the head were obtained using MRA technique without contrast. COMPARISON:  None. FINDINGS: MRI HEAD FINDINGS Brain: No acute infarct, mass effect or extra-axial collection. No acute or chronic hemorrhage. Normal white matter  signal, parenchymal volume and CSF spaces. The midline structures are normal. Vascular: Major flow voids are preserved. Skull and upper cervical spine: Normal calvarium and skull base. Visualized upper cervical spine and soft tissues are normal. Sinuses/Orbits:No paranasal sinus fluid levels or advanced mucosal thickening. No mastoid or middle ear effusion. Normal orbits. MRA HEAD FINDINGS POSTERIOR CIRCULATION: --Vertebral arteries: Normal --Inferior cerebellar arteries: Normal. --Basilar artery: Normal. --Superior cerebellar arteries: Normal. --Posterior cerebral arteries: Normal. ANTERIOR CIRCULATION: --Intracranial internal carotid arteries: Normal. --Anterior cerebral arteries (ACA): Normal. --Middle cerebral arteries (MCA): Normal. ANATOMIC VARIANTS: None IMPRESSION: Normal MRI/MRA of the brain. Electronically Signed   By: Deatra Robinson M.D.   On: 01/09/2021 02:25   MR Brain W and Wo Contrast  Result Date: 01/09/2021 CLINICAL DATA:  Left lower extremity numbness. Possible recent stroke. EXAM: MRI HEAD WITHOUT CONTRAST MRA HEAD WITHOUT CONTRAST TECHNIQUE: Multiplanar, multiecho pulse sequences of the brain and surrounding structures were obtained without intravenous contrast. Angiographic images of the head were obtained using MRA technique without contrast. COMPARISON:  None. FINDINGS: MRI HEAD FINDINGS Brain: No acute infarct, mass effect or extra-axial collection. No acute or chronic hemorrhage. Normal white matter signal, parenchymal volume and CSF spaces. The midline structures are normal. Vascular: Major flow voids are preserved. Skull and upper cervical spine: Normal calvarium and skull base. Visualized upper cervical spine and soft tissues are normal. Sinuses/Orbits:No paranasal sinus fluid levels or advanced mucosal thickening. No mastoid or middle ear effusion. Normal orbits. MRA HEAD FINDINGS POSTERIOR CIRCULATION: --Vertebral arteries: Normal --Inferior cerebellar arteries: Normal. --Basilar  artery: Normal. --Superior cerebellar arteries: Normal. --Posterior cerebral arteries: Normal. ANTERIOR CIRCULATION: --Intracranial internal carotid arteries: Normal. --Anterior cerebral arteries (ACA): Normal. --Middle cerebral arteries (MCA): Normal. ANATOMIC VARIANTS: None IMPRESSION: Normal MRI/MRA of the brain. Electronically Signed   By: Deatra Robinson M.D.   On: 01/09/2021 02:25     Assessment/Plan Principal Problem:   Left leg weakness Active Problems:   Nicotine dependence    Left leg weakness Progressive and following a traumatic fall about 4 weeks ago with associated ??  Urinary incontinence and difficulty with ejaculation. Concern for possible cord compression Appreciate neurology input Awaiting results of pan MRI   Nicotine dependence Smoking discussed with patient He declines a nicotine transdermal patch at this time    DVT prophylaxis: SCD Code Status: full code Family Communication: Greater than 50% of time was spent discussing patient's condition and plan of care with him and his wife at the bedside.  All questions and concerns have been addressed.  They verbalized understanding and agree with the plan. Disposition Plan: Back to previous home environment Consults called: Neurology Status: Inpatient.  The medical decision making for this patient was of high complexity and patient is at high risk for clinical deterioration during this hospitalization.    Lucile Shutters MD [facility]     01/09/2021, 9:18 AM

## 2021-01-10 LAB — CBC
HCT: 41.4 % (ref 39.0–52.0)
Hemoglobin: 14 g/dL (ref 13.0–17.0)
MCH: 29.9 pg (ref 26.0–34.0)
MCHC: 33.8 g/dL (ref 30.0–36.0)
MCV: 88.5 fL (ref 80.0–100.0)
Platelets: 236 10*3/uL (ref 150–400)
RBC: 4.68 MIL/uL (ref 4.22–5.81)
RDW: 12.7 % (ref 11.5–15.5)
WBC: 4.3 10*3/uL (ref 4.0–10.5)
nRBC: 0 % (ref 0.0–0.2)

## 2021-01-10 LAB — BASIC METABOLIC PANEL
Anion gap: 8 (ref 5–15)
BUN: 12 mg/dL (ref 6–20)
CO2: 23 mmol/L (ref 22–32)
Calcium: 8.8 mg/dL — ABNORMAL LOW (ref 8.9–10.3)
Chloride: 108 mmol/L (ref 98–111)
Creatinine, Ser: 0.79 mg/dL (ref 0.61–1.24)
GFR, Estimated: >60 mL/min (ref >60)
Glucose, Bld: 90 mg/dL (ref 70–99)
Potassium: 3.9 mmol/L (ref 3.5–5.1)
Sodium: 139 mmol/L (ref 135–145)

## 2021-01-10 LAB — HIV ANTIBODY (ROUTINE TESTING W REFLEX): HIV Screen 4th Generation wRfx: NONREACTIVE

## 2021-01-10 MED ORDER — HYDRALAZINE HCL 20 MG/ML IJ SOLN: 10.0000 mg | INTRAMUSCULAR | Status: DC | PRN

## 2021-01-10 MED ORDER — METHYLPREDNISOLONE 4 MG PO TABS
8.0000 mg | ORAL_TABLET | Freq: Every morning | ORAL | Status: AC
  Administered 2021-01-10: 09:00:00 8 mg via ORAL
  Filled 2021-01-10: qty 2

## 2021-01-10 MED ORDER — ACETAMINOPHEN 325 MG PO TABS: 650.0000 mg | ORAL_TABLET | Freq: Four times a day (QID) | ORAL | Status: DC | PRN

## 2021-01-10 MED ORDER — METHYLPREDNISOLONE 4 MG PO TABS
4.0000 mg | ORAL_TABLET | ORAL | Status: DC
  Filled 2021-01-10: qty 1

## 2021-01-10 MED ORDER — METHYLPREDNISOLONE 4 MG PO TABS
8.0000 mg | ORAL_TABLET | Freq: Every evening | ORAL | Status: AC
  Administered 2021-01-11: 8 mg via ORAL
  Filled 2021-01-10: qty 2

## 2021-01-10 MED ORDER — IPRATROPIUM-ALBUTEROL 0.5-2.5 (3) MG/3ML IN SOLN: 3.0000 mL | RESPIRATORY_TRACT | Status: DC | PRN

## 2021-01-10 MED ORDER — SENNOSIDES-DOCUSATE SODIUM 8.6-50 MG PO TABS: 1.0000 | ORAL_TABLET | Freq: Every evening | ORAL | Status: DC | PRN

## 2021-01-10 MED ORDER — METOPROLOL TARTRATE 5 MG/5ML IV SOLN: 5.0000 mg | INTRAVENOUS | Status: DC | PRN

## 2021-01-10 MED ORDER — METHYLPREDNISOLONE 4 MG PO TABS
4.0000 mg | ORAL_TABLET | ORAL | Status: AC
  Administered 2021-01-10: 4 mg via ORAL

## 2021-01-10 MED ORDER — METHYLPREDNISOLONE 4 MG PO TABS: 4.0000 mg | ORAL_TABLET | Freq: Four times a day (QID) | ORAL | Status: DC

## 2021-01-10 MED ORDER — DEXAMETHASONE SODIUM PHOSPHATE 4 MG/ML IJ SOLN: 4.0000 mg | Freq: Two times a day (BID) | INTRAMUSCULAR | Status: DC

## 2021-01-10 MED ORDER — DM-GUAIFENESIN ER 30-600 MG PO TB12: 1.0000 | ORAL_TABLET | Freq: Two times a day (BID) | ORAL | Status: DC | PRN

## 2021-01-10 MED ORDER — METHYLPREDNISOLONE 4 MG PO TABS: 8.0000 mg | ORAL_TABLET | Freq: Every evening | ORAL | Status: DC

## 2021-01-10 MED ORDER — METHYLPREDNISOLONE 4 MG PO TABS
4.0000 mg | ORAL_TABLET | Freq: Three times a day (TID) | ORAL | Status: DC
  Administered 2021-01-11: 4 mg via ORAL
  Filled 2021-01-10: qty 1

## 2021-01-10 NOTE — Progress Notes (Signed)
PROGRESS NOTE    Tyler Winters  YBW:[id number] DOB: 06-12-1966 DOA: 01/08/2021 PCP: Patient, No Pcp Per   Brief Narrative:  55 year old male with no known past medical history sustained fall outpatient about 4 weeks ago now coming in with left lower extremity weakness.  Case was discussed with neurosurgery and neurology, started on Medrol Dosepak due to abnormal findings on MRI spine concerning for disc protrusion with L5 nerve root compression.  PT OT has been consulted.   Assessment & Plan:   Principal Problem:   Left leg weakness Active Problems:   Nicotine dependence  Left lower extremity weakness likely secondary to L4-5 disc protrusion with left L5 nerve root compression causing radiculopathy Cervical spine spinal stenosis, thoracic spine degenerative disease -Started patient on Medrol Dosepak.  PT/OT consulted -If fails to improve, may require outpatient EMG.  Neurosurgery has been consulted for their input as well but unlikely patient requires surgical intervention at this moment.  Tobacco use -Nicotine patch as needed   DVT prophylaxis: SCDs Code Status: Full code Family Communication: Wife at bedside  Status is: Inpatient  Remains inpatient appropriate because:Inpatient level of care appropriate due to severity of illness   Dispo: The patient is from: Home              Anticipated d/c is to: Home              Anticipated d/c date is: 1 day              Patient currently is not medically stable to d/c.  Patient and wife wants to monitor his progress after trial of 24 hours of steroids prior to his discharge.  Hopefully home tomorrow.   Difficult to place patient No       Body mass index is 31.8 kg/m.         Consultants:   Neurology  Neurosurgery    Subjective: Still having left lower extremity weakness with difficulty ambulating.  Review of Systems Otherwise negative except as per HPI, including: General: Denies fever, chills,  night sweats or unintended weight loss. Resp: Denies cough, wheezing, shortness of breath. Cardiac: Denies chest pain, palpitations, orthopnea, paroxysmal nocturnal dyspnea. GI: Denies abdominal pain, nausea, vomiting, diarrhea or constipation GU: Denies dysuria, frequency, hesitancy or incontinence MS: Denies muscle aches, joint pain or swelling Neuro: Denies headache, neurologic deficits (focal weakness, numbness, tingling), abnormal gait Psych: Denies anxiety, depression, SI/HI/AVH Skin: Denies new rashes or lesions ID: Denies sick contacts, exotic exposures, travel  Examination:  General exam: Appears calm and comfortable  Respiratory system: Clear to auscultation. Respiratory effort normal. Cardiovascular system: S1 & S2 heard, RRR. No JVD, murmurs, rubs, gallops or clicks. No pedal edema. Gastrointestinal system: Abdomen is nondistended, soft and nontender. No organomegaly or masses felt. Normal bowel sounds heard. Central nervous system: Alert and oriented. No focal neurological deficits. Extremities: Left lower extremity strength 4/5.  Right lower extremity strength 5/5 Skin: No rashes, lesions or ulcers Psychiatry: Judgement and insight appear normal. Mood & affect appropriate.     Objective: Vitals:   01/09/21 1534 01/09/21 2046 01/10/21 0518 01/10/21 0747  BP: (!) 151/98 (!) 154/95 132/81 125/81  Pulse: (!) 57 (!) 59 (!) 59 (!) 58  Resp: 16 16 16 15   Temp: 98.6 F (37 C) 98.5 F (36.9 C) 98.5 F (36.9 C) 98 F (36.7 C)  TempSrc: Oral Oral Oral Oral  SpO2: 97% 98% 98% 97%  Weight:      Height:  No intake or output data in the 24 hours ending 01/10/21 1049 Filed Weights   01/08/21 1916  Weight: 89.4 kg     Data Reviewed:   CBC: Recent Labs  Lab 01/08/21 1926 01/10/21 0505  WBC 4.6 4.3  HGB 14.4 14.0  HCT 43.5 41.4  MCV 89.7 88.5  PLT 252 236   Basic Metabolic Panel: Recent Labs  Lab 01/08/21 1926 01/10/21 0505  NA 141 139  K 3.9 3.9  CL  109 108  CO2 24 23  GLUCOSE 99 90  BUN 12 12  CREATININE 0.87 0.79  CALCIUM 8.9 8.8*   GFR: Estimated Creatinine Clearance: 111.8 mL/min (by C-G formula based on SCr of 0.79 mg/dL). Liver Function Tests: No results for input(s): AST, ALT, ALKPHOS, BILITOT, PROT, ALBUMIN in the last 168 hours. No results for input(s): LIPASE, AMYLASE in the last 168 hours. No results for input(s): AMMONIA in the last 168 hours. Coagulation Profile: No results for input(s): INR, PROTIME in the last 168 hours. Cardiac Enzymes: No results for input(s): CKTOTAL, CKMB, CKMBINDEX, TROPONINI in the last 168 hours. BNP (last 3 results) No results for input(s): PROBNP in the last 8760 hours. HbA1C: No results for input(s): HGBA1C in the last 72 hours. CBG: No results for input(s): GLUCAP in the last 168 hours. Lipid Profile: No results for input(s): CHOL, HDL, LDLCALC, TRIG, CHOLHDL, LDLDIRECT in the last 72 hours. Thyroid Function Tests: No results for input(s): TSH, T4TOTAL, FREET4, T3FREE, THYROIDAB in the last 72 hours. Anemia Panel: No results for input(s): VITAMINB12, FOLATE, FERRITIN, TIBC, IRON, RETICCTPCT in the last 72 hours. Sepsis Labs: No results for input(s): PROCALCITON, LATICACIDVEN in the last 168 hours.  Recent Results (from the past 240 hour(s))  Resp Panel by RT-PCR (Flu A&B, Covid) Nasopharyngeal Swab     Status: None   Collection Time: 01/09/21  7:52 AM   Specimen: Nasopharyngeal Swab; Nasopharyngeal(NP) swabs in vial transport medium  Result Value Ref Range Status   SARS Coronavirus 2 by RT PCR NEGATIVE NEGATIVE Final    Comment: (NOTE) SARS-CoV-2 target nucleic acids are NOT DETECTED.  The SARS-CoV-2 RNA is generally detectable in upper respiratory specimens during the acute phase of infection. The lowest concentration of SARS-CoV-2 viral copies this assay can detect is 138 copies/mL. A negative result does not preclude SARS-Cov-2 infection and should not be used as the sole  basis for treatment or other patient management decisions. A negative result may occur with  improper specimen collection/handling, submission of specimen other than nasopharyngeal swab, presence of viral mutation(s) within the areas targeted by this assay, and inadequate number of viral copies(<138 copies/mL). A negative result must be combined with clinical observations, patient history, and epidemiological information. The expected result is Negative.  Fact Sheet for Patients:  BloggerCourse.comhttps://www.fda.gov/media/152166/download  Fact Sheet for Healthcare Providers:  SeriousBroker.ithttps://www.fda.gov/media/152162/download  This test is no t yet approved or cleared by the [place] FDA and  has been authorized for detection and/or diagnosis of SARS-CoV-2 by FDA under an Emergency Use Authorization (EUA). This EUA will remain  in effect (meaning this test can be used) for the duration of the COVID-19 declaration under Section 564(b)(1) of the Act, 21 U.S.C.section 360bbb-3(b)(1), unless the authorization is terminated  or revoked sooner.       Influenza A by PCR NEGATIVE NEGATIVE Final   Influenza B by PCR NEGATIVE NEGATIVE Final    Comment: (NOTE) The Xpert Xpress SARS-CoV-2/FLU/RSV plus assay is intended as an aid in the diagnosis of  influenza from Nasopharyngeal swab specimens and should not be used as a sole basis for treatment. Nasal washings and aspirates are unacceptable for Xpert Xpress SARS-CoV-2/FLU/RSV testing.  Fact Sheet for Patients: BloggerCourse.com  Fact Sheet for Healthcare Providers: SeriousBroker.it  This test is not yet approved or cleared by the [place] FDA and has been authorized for detection and/or diagnosis of SARS-CoV-2 by FDA under an Emergency Use Authorization (EUA). This EUA will remain in effect (meaning this test can be used) for the duration of the COVID-19 declaration under Section 564(b)(1) of the Act,  21 U.S.C. section 360bbb-3(b)(1), unless the authorization is terminated or revoked.  Performed at [facility], [address] [place], [place] [address]          Radiology Studies: CT Cervical Spine Wo Contrast  Result Date: 01/09/2021 CLINICAL DATA:  about a month ago coming in for a fall and being diagnosed with a concussion. Pt states he now has a "pimp" walk to the left leg, with left leg weakness. EXAM: CT CERVICAL, THORACIC, AND LUMBAR SPINE WITHOUT CONTRAST TECHNIQUE: Multidetector CT imaging of the cervical, thoracic and lumbar spine was performed without intravenous contrast. Multiplanar CT image reconstructions were also generated. COMPARISON:  CT cervical spine 12/09/2020. FINDINGS: CT CERVICAL SPINE FINDINGS Alignment: Straightening of the normal cervical lordosis likely due to positioning and degenerative changes as well as surgical changes. Otherwise normal. Skull base and vertebrae: Anterior disc fusion at the C5-C6 level. Redemonstration of similar multilevel moderate severe degenerative changes of the spine with osteophyte formation and uncovertebral arthropathy. Multilevel severe osseous neural foraminal stenosis. No significant osseous central canal stenosis. No acute fracture. No aggressive appearing focal osseous lesion or focal pathologic process. Soft tissues and spinal canal: No prevertebral fluid or swelling. No visible canal hematoma. Upper chest: Unremarkable. Other: None. CT THORACIC SPINE FINDINGS Segmentation: 12 rib-bearing thoracic vertebral bodies. Alignment: Normal. Vertebrae: Mild multilevel osteophyte formation. No acute displaced fracture. No suspicious lytic or blastic osseous lesion. No neural foraminal central canal stenosis. Paraspinal and other soft tissues: Negative. Disc levels: Maintained Other: None. CT LUMBAR SPINE FINDINGS Segmentation: 5 non-rib-bearing lumbar vertebral bodies. Alignment: Slight straightening of the normal cervical  lordosis likely due to positioning. Vertebrae: Mild L3 through L5 osteophyte formation. Mild L5-S1 facet arthropathy. Posterior disc bulge at the L4-L5 level. No severe osseous neural foraminal or central canal stenosis. No acute displaced fracture. No suspicious lytic or blastic osseous lesions. Paraspinal and other soft tissues: Negative. Disc levels: Intervertebral disc space vacuum phenomenon at the L4-L5 level. Other: Visualized sacral spine unremarkable. Abdominal aorta atherosclerotic plaque. IMPRESSION: 1. No acute displaced fracture or traumatic listhesis of the cervical, thoracic, lumbar spine. 2. 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 3. Intervertebral disc space vacuum phenomenon and disc bulge at the L4-L5 level. Electronically Signed   By: Tish Frederickson M.D.   On: 01/09/2021 06:58   CT Thoracic Spine Wo Contrast  Result Date: 01/09/2021 CLINICAL DATA:  about a month ago coming in for a fall and being diagnosed with a concussion. Pt states he now has a "pimp" walk to the left leg, with left leg weakness. EXAM: CT CERVICAL, THORACIC, AND LUMBAR SPINE WITHOUT CONTRAST TECHNIQUE: Multidetector CT imaging of the cervical, thoracic and lumbar spine was performed without intravenous contrast. Multiplanar CT image reconstructions were also generated. COMPARISON:  CT cervical spine 12/09/2020. FINDINGS: CT CERVICAL SPINE FINDINGS Alignment: Straightening of the normal cervical lordosis likely due  to positioning and degenerative changes as well as surgical changes. Otherwise normal. Skull base and vertebrae: Anterior disc fusion at the C5-C6 level. Redemonstration of similar multilevel moderate severe degenerative changes of the spine with osteophyte formation and uncovertebral arthropathy. Multilevel severe osseous neural foraminal stenosis. No significant osseous central canal stenosis. No acute fracture. No  aggressive appearing focal osseous lesion or focal pathologic process. Soft tissues and spinal canal: No prevertebral fluid or swelling. No visible canal hematoma. Upper chest: Unremarkable. Other: None. CT THORACIC SPINE FINDINGS Segmentation: 12 rib-bearing thoracic vertebral bodies. Alignment: Normal. Vertebrae: Mild multilevel osteophyte formation. No acute displaced fracture. No suspicious lytic or blastic osseous lesion. No neural foraminal central canal stenosis. Paraspinal and other soft tissues: Negative. Disc levels: Maintained Other: None. CT LUMBAR SPINE FINDINGS Segmentation: 5 non-rib-bearing lumbar vertebral bodies. Alignment: Slight straightening of the normal cervical lordosis likely due to positioning. Vertebrae: Mild L3 through L5 osteophyte formation. Mild L5-S1 facet arthropathy. Posterior disc bulge at the L4-L5 level. No severe osseous neural foraminal or central canal stenosis. No acute displaced fracture. No suspicious lytic or blastic osseous lesions. Paraspinal and other soft tissues: Negative. Disc levels: Intervertebral disc space vacuum phenomenon at the L4-L5 level. Other: Visualized sacral spine unremarkable. Abdominal aorta atherosclerotic plaque. IMPRESSION: 1. No acute displaced fracture or traumatic listhesis of the cervical, thoracic, lumbar spine. 2. 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 3. Intervertebral disc space vacuum phenomenon and disc bulge at the L4-L5 level. Electronically Signed   By: Tish Frederickson M.D.   On: 01/09/2021 06:58   CT Lumbar Spine Wo Contrast  Result Date: 01/09/2021 CLINICAL DATA:  about a month ago coming in for a fall and being diagnosed with a concussion. Pt states he now has a "pimp" walk to the left leg, with left leg weakness. EXAM: CT CERVICAL, THORACIC, AND LUMBAR SPINE WITHOUT CONTRAST TECHNIQUE: Multidetector CT imaging of the cervical,  thoracic and lumbar spine was performed without intravenous contrast. Multiplanar CT image reconstructions were also generated. COMPARISON:  CT cervical spine 12/09/2020. FINDINGS: CT CERVICAL SPINE FINDINGS Alignment: Straightening of the normal cervical lordosis likely due to positioning and degenerative changes as well as surgical changes. Otherwise normal. Skull base and vertebrae: Anterior disc fusion at the C5-C6 level. Redemonstration of similar multilevel moderate severe degenerative changes of the spine with osteophyte formation and uncovertebral arthropathy. Multilevel severe osseous neural foraminal stenosis. No significant osseous central canal stenosis. No acute fracture. No aggressive appearing focal osseous lesion or focal pathologic process. Soft tissues and spinal canal: No prevertebral fluid or swelling. No visible canal hematoma. Upper chest: Unremarkable. Other: None. CT THORACIC SPINE FINDINGS Segmentation: 12 rib-bearing thoracic vertebral bodies. Alignment: Normal. Vertebrae: Mild multilevel osteophyte formation. No acute displaced fracture. No suspicious lytic or blastic osseous lesion. No neural foraminal central canal stenosis. Paraspinal and other soft tissues: Negative. Disc levels: Maintained Other: None. CT LUMBAR SPINE FINDINGS Segmentation: 5 non-rib-bearing lumbar vertebral bodies. Alignment: Slight straightening of the normal cervical lordosis likely due to positioning. Vertebrae: Mild L3 through L5 osteophyte formation. Mild L5-S1 facet arthropathy. Posterior disc bulge at the L4-L5 level. No severe osseous neural foraminal or central canal stenosis. No acute displaced fracture. No suspicious lytic or blastic osseous lesions. Paraspinal and other soft tissues: Negative. Disc levels: Intervertebral disc space vacuum phenomenon at the L4-L5 level. Other: Visualized sacral spine unremarkable. Abdominal aorta atherosclerotic plaque. IMPRESSION: 1. No acute displaced fracture or  traumatic listhesis of the  cervical, thoracic, lumbar spine. 2. 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 3. Intervertebral disc space vacuum phenomenon and disc bulge at the L4-L5 level. Electronically Signed   By: Tish Frederickson M.D.   On: 01/09/2021 06:58   MR ANGIO HEAD WO CONTRAST  Result Date: 01/09/2021 CLINICAL DATA:  Left lower extremity numbness. Possible recent stroke. EXAM: MRI HEAD WITHOUT CONTRAST MRA HEAD WITHOUT CONTRAST TECHNIQUE: Multiplanar, multiecho pulse sequences of the brain and surrounding structures were obtained without intravenous contrast. Angiographic images of the head were obtained using MRA technique without contrast. COMPARISON:  None. FINDINGS: MRI HEAD FINDINGS Brain: No acute infarct, mass effect or extra-axial collection. No acute or chronic hemorrhage. Normal white matter signal, parenchymal volume and CSF spaces. The midline structures are normal. Vascular: Major flow voids are preserved. Skull and upper cervical spine: Normal calvarium and skull base. Visualized upper cervical spine and soft tissues are normal. Sinuses/Orbits:No paranasal sinus fluid levels or advanced mucosal thickening. No mastoid or middle ear effusion. Normal orbits. MRA HEAD FINDINGS POSTERIOR CIRCULATION: --Vertebral arteries: Normal --Inferior cerebellar arteries: Normal. --Basilar artery: Normal. --Superior cerebellar arteries: Normal. --Posterior cerebral arteries: Normal. ANTERIOR CIRCULATION: --Intracranial internal carotid arteries: Normal. --Anterior cerebral arteries (ACA): Normal. --Middle cerebral arteries (MCA): Normal. ANATOMIC VARIANTS: None IMPRESSION: Normal MRI/MRA of the brain. Electronically Signed   By: Deatra Robinson M.D.   On: 01/09/2021 02:25   MR Brain W and Wo Contrast  Result Date: 01/09/2021 CLINICAL DATA:  Left lower extremity numbness. Possible recent stroke. EXAM: MRI HEAD  WITHOUT CONTRAST MRA HEAD WITHOUT CONTRAST TECHNIQUE: Multiplanar, multiecho pulse sequences of the brain and surrounding structures were obtained without intravenous contrast. Angiographic images of the head were obtained using MRA technique without contrast. COMPARISON:  None. FINDINGS: MRI HEAD FINDINGS Brain: No acute infarct, mass effect or extra-axial collection. No acute or chronic hemorrhage. Normal white matter signal, parenchymal volume and CSF spaces. The midline structures are normal. Vascular: Major flow voids are preserved. Skull and upper cervical spine: Normal calvarium and skull base. Visualized upper cervical spine and soft tissues are normal. Sinuses/Orbits:No paranasal sinus fluid levels or advanced mucosal thickening. No mastoid or middle ear effusion. Normal orbits. MRA HEAD FINDINGS POSTERIOR CIRCULATION: --Vertebral arteries: Normal --Inferior cerebellar arteries: Normal. --Basilar artery: Normal. --Superior cerebellar arteries: Normal. --Posterior cerebral arteries: Normal. ANTERIOR CIRCULATION: --Intracranial internal carotid arteries: Normal. --Anterior cerebral arteries (ACA): Normal. --Middle cerebral arteries (MCA): Normal. ANATOMIC VARIANTS: None IMPRESSION: Normal MRI/MRA of the brain. Electronically Signed   By: Deatra Robinson M.D.   On: 01/09/2021 02:25   MR Cervical Spine Wo Contrast  Result Date: 01/09/2021 CLINICAL DATA:  Left leg weakness following a fall with loss of consciousness approximately 4 weeks prior. Unsteady gait. Evaluate for cord compression and cauda equina syndrome. EXAM: MRI CERVICAL, THORACIC AND LUMBAR SPINE WITHOUT CONTRAST TECHNIQUE: Multiplanar and multiecho pulse sequences of the cervical spine, to include the craniocervical junction and cervicothoracic junction, and thoracic and lumbar spine, were obtained without intravenous contrast. COMPARISON:  CTs of the cervical, thoracic and lumbar spine same date. FINDINGS: MRI CERVICAL SPINE FINDINGS Alignment:  Straightening without focal angulation or listhesis. Vertebrae: No evidence of acute fracture or focal osseous lesion. Previous anterior discectomy and fusion at C5-6 with solid interbody fusion as correlated with preceding CT. Cord: There are symmetric foci of T2 hyperintensity bilaterally in the cord at C5, best seen on axial image 15/4. There is no focal cord expansion or atrophy  at this level. No other cord signal abnormalities are identified. 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 Posterior Fossa, vertebral arteries, paraspinal tissues: The craniocervical junction appears normal. There are bilateral vertebral artery flow voids. No paraspinal abnormalities. Disc levels: C2-3: Mild disc bulging and uncinate spurring. Mild foraminal narrowing bilaterally. No cord deformity. C3-4: Loss of disc height with disc bulging, uncinate spurring and facet hypertrophy bilaterally. Moderate biforaminal narrowing. No cord deformity. C4-5: Loss of disc height with disc bulging, uncinate spurring and bilateral facet hypertrophy. The CSF surrounding the cord is effaced with narrowing the AP diameter of the canal to 7 mm. As above, just below the disc space, there is symmetric T2 hyperintensity in the cord bilaterally. Moderate to severe foraminal narrowing bilaterally. C5-6: Status post anterior discectomy and fusion. Residual uncinate spurring contributes to moderate foraminal narrowing bilaterally. No cord deformity. C6-7: Loss of disc height with disc bulging, uncinate spurring and bilateral facet hypertrophy. Moderate to severe osseous foraminal narrowing bilaterally. No cord deformity. C7-T1: No significant findings. MRI THORACIC SPINE FINDINGS Alignment:  Physiologic. Vertebrae: No acute or suspicious osseous findings. Cord:  The thoracic cord appears normal in signal and caliber. Paraspinal and other soft tissues: No significant paraspinal abnormalities.  Disc levels: Mild thoracic spine degenerative changes. There is a small central disc protrusion at T3-4 without cord deformity. There is a small left paracentral disc protrusion at T4-5. No other significant disc space findings are present. The neural foramina appear patent. No cord deformity. MRI LUMBAR SPINE FINDINGS Segmentation: There are 5 lumbar type vertebral bodies. Alignment:  Straightening without focal angulation or listhesis. Vertebrae: No worrisome osseous lesion, acute fracture or pars defect. The visualized sacroiliac joints appear unremarkable. Conus medullaris: Extends to the L1 level and appears normal. Paraspinal and other soft tissues: No significant paraspinal findings. Disc levels: No significant disc space findings from T12-L1 through L2-3. L3-4: Mild disc bulging and mild right foraminal narrowing. No nerve root encroachment. L4-5: Loss of disc height with disc desiccation and vacuum phenomenon as correlated with prior CT. There is annular disc bulging and a moderate-sized left paracentral disc protrusion. There is mass effect on the thecal sac and left lateral recess with possible left L5 nerve root encroachment. The right lateral recess and both foramina are mildly narrowed. L5-S1: Disc height and hydration are maintained. Mild bilateral facet hypertrophy without resulting spinal stenosis or nerve root encroachment. IMPRESSION: 1. No acute osseous or ligamentous findings in the spine. No clear explanation for the patient's symptoms. 2. Symmetric foci of T2 hyperintensity in the cord at C5. 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 No cord edema or expansion. The conus medullaris and cauda equina appear normal. 3. Multilevel cervical spondylosis post anterior discectomy and fusion at C5-6. Mild spinal stenosis at C4-5. Multilevel cervical foraminal narrowing as detailed above. 4. Mild thoracic spine degenerative changes with  small disc protrusions at T3-4 and T4-5. No cord deformity or foraminal compromise. 5. Moderate-sized left paracentral disc protrusion at L4-5 with mass effect on the thecal sac and possible left L5 nerve root encroachment. Electronically Signed   By: Carey Bullocks M.D.   On: 01/09/2021 10:32   MR THORACIC SPINE WO CONTRAST  Result Date: 01/09/2021 CLINICAL DATA:  Left leg weakness following a fall with loss of consciousness approximately 4 weeks prior. Unsteady gait. Evaluate for cord compression and cauda equina syndrome. EXAM: MRI CERVICAL, THORACIC AND  LUMBAR SPINE WITHOUT CONTRAST TECHNIQUE: Multiplanar and multiecho pulse sequences of the cervical spine, to include the craniocervical junction and cervicothoracic junction, and thoracic and lumbar spine, were obtained without intravenous contrast. COMPARISON:  CTs of the cervical, thoracic and lumbar spine same date. FINDINGS: MRI CERVICAL SPINE FINDINGS Alignment: Straightening without focal angulation or listhesis. Vertebrae: No evidence of acute fracture or focal osseous lesion. Previous anterior discectomy and fusion at C5-6 with solid interbody fusion as correlated with preceding CT. Cord: There are symmetric foci of T2 hyperintensity bilaterally in the cord at C5, best seen on axial image 15/4. There is no focal cord expansion or atrophy at this level. No other cord signal abnormalities are identified. 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 Posterior Fossa, vertebral arteries, paraspinal tissues: The craniocervical junction appears normal. There are bilateral vertebral artery flow voids. No paraspinal abnormalities. Disc levels: C2-3: Mild disc bulging and uncinate spurring. Mild foraminal narrowing bilaterally. No cord deformity. C3-4: Loss of disc height with disc bulging, uncinate spurring and facet hypertrophy bilaterally. Moderate biforaminal narrowing. No cord deformity. C4-5:  Loss of disc height with disc bulging, uncinate spurring and bilateral facet hypertrophy. The CSF surrounding the cord is effaced with narrowing the AP diameter of the canal to 7 mm. As above, just below the disc space, there is symmetric T2 hyperintensity in the cord bilaterally. Moderate to severe foraminal narrowing bilaterally. C5-6: Status post anterior discectomy and fusion. Residual uncinate spurring contributes to moderate foraminal narrowing bilaterally. No cord deformity. C6-7: Loss of disc height with disc bulging, uncinate spurring and bilateral facet hypertrophy. Moderate to severe osseous foraminal narrowing bilaterally. No cord deformity. C7-T1: No significant findings. MRI THORACIC SPINE FINDINGS Alignment:  Physiologic. Vertebrae: No acute or suspicious osseous findings. Cord:  The thoracic cord appears normal in signal and caliber. Paraspinal and other soft tissues: No significant paraspinal abnormalities. Disc levels: Mild thoracic spine degenerative changes. There is a small central disc protrusion at T3-4 without cord deformity. There is a small left paracentral disc protrusion at T4-5. No other significant disc space findings are present. The neural foramina appear patent. No cord deformity. MRI LUMBAR SPINE FINDINGS Segmentation: There are 5 lumbar type vertebral bodies. Alignment:  Straightening without focal angulation or listhesis. Vertebrae: No worrisome osseous lesion, acute fracture or pars defect. The visualized sacroiliac joints appear unremarkable. Conus medullaris: Extends to the L1 level and appears normal. Paraspinal and other soft tissues: No significant paraspinal findings. Disc levels: No significant disc space findings from T12-L1 through L2-3. L3-4: Mild disc bulging and mild right foraminal narrowing. No nerve root encroachment. L4-5: Loss of disc height with disc desiccation and vacuum phenomenon as correlated with prior CT. There is annular disc bulging and a moderate-sized  left paracentral disc protrusion. There is mass effect on the thecal sac and left lateral recess with possible left L5 nerve root encroachment. The right lateral recess and both foramina are mildly narrowed. L5-S1: Disc height and hydration are maintained. Mild bilateral facet hypertrophy without resulting spinal stenosis or nerve root encroachment. IMPRESSION: 1. No acute osseous or ligamentous findings in the spine. No clear explanation for the patient's symptoms. 2. Symmetric foci of T2 hyperintensity in the cord at C5. 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 No cord edema or expansion. The conus medullaris and cauda equina appear normal. 3. Multilevel cervical spondylosis post anterior discectomy and fusion at C5-6. Mild  spinal stenosis at C4-5. Multilevel cervical foraminal narrowing as detailed above. 4. Mild thoracic spine degenerative changes with small disc protrusions at T3-4 and T4-5. No cord deformity or foraminal compromise. 5. Moderate-sized left paracentral disc protrusion at L4-5 with mass effect on the thecal sac and possible left L5 nerve root encroachment. Electronically Signed   By: Carey Bullocks M.D.   On: 01/09/2021 10:32   MR LUMBAR SPINE WO CONTRAST  Result Date: 01/09/2021 CLINICAL DATA:  Left leg weakness following a fall with loss of consciousness approximately 4 weeks prior. Unsteady gait. Evaluate for cord compression and cauda equina syndrome. EXAM: MRI CERVICAL, THORACIC AND LUMBAR SPINE WITHOUT CONTRAST TECHNIQUE: Multiplanar and multiecho pulse sequences of the cervical spine, to include the craniocervical junction and cervicothoracic junction, and thoracic and lumbar spine, were obtained without intravenous contrast. COMPARISON:  CTs of the cervical, thoracic and lumbar spine same date. FINDINGS: MRI CERVICAL SPINE FINDINGS Alignment: Straightening without focal angulation or listhesis. Vertebrae: No evidence  of acute fracture or focal osseous lesion. Previous anterior discectomy and fusion at C5-6 with solid interbody fusion as correlated with preceding CT. Cord: There are symmetric foci of T2 hyperintensity bilaterally in the cord at C5, best seen on axial image 15/4. There is no focal cord expansion or atrophy at this level. No other cord signal abnormalities are identified. 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 Posterior Fossa, vertebral arteries, paraspinal tissues: The craniocervical junction appears normal. There are bilateral vertebral artery flow voids. No paraspinal abnormalities. Disc levels: C2-3: Mild disc bulging and uncinate spurring. Mild foraminal narrowing bilaterally. No cord deformity. C3-4: Loss of disc height with disc bulging, uncinate spurring and facet hypertrophy bilaterally. Moderate biforaminal narrowing. No cord deformity. C4-5: Loss of disc height with disc bulging, uncinate spurring and bilateral facet hypertrophy. The CSF surrounding the cord is effaced with narrowing the AP diameter of the canal to 7 mm. As above, just below the disc space, there is symmetric T2 hyperintensity in the cord bilaterally. Moderate to severe foraminal narrowing bilaterally. C5-6: Status post anterior discectomy and fusion. Residual uncinate spurring contributes to moderate foraminal narrowing bilaterally. No cord deformity. C6-7: Loss of disc height with disc bulging, uncinate spurring and bilateral facet hypertrophy. Moderate to severe osseous foraminal narrowing bilaterally. No cord deformity. C7-T1: No significant findings. MRI THORACIC SPINE FINDINGS Alignment:  Physiologic. Vertebrae: No acute or suspicious osseous findings. Cord:  The thoracic cord appears normal in signal and caliber. Paraspinal and other soft tissues: No significant paraspinal abnormalities. Disc levels: Mild thoracic spine degenerative changes. There is a small  central disc protrusion at T3-4 without cord deformity. There is a small left paracentral disc protrusion at T4-5. No other significant disc space findings are present. The neural foramina appear patent. No cord deformity. MRI LUMBAR SPINE FINDINGS Segmentation: There are 5 lumbar type vertebral bodies. Alignment:  Straightening without focal angulation or listhesis. Vertebrae: No worrisome osseous lesion, acute fracture or pars defect. The visualized sacroiliac joints appear unremarkable. Conus medullaris: Extends to the L1 level and appears normal. Paraspinal and other soft tissues: No significant paraspinal findings. Disc levels: No significant disc space findings from T12-L1 through L2-3. L3-4: Mild disc bulging and mild right foraminal narrowing. No nerve root encroachment. L4-5: Loss of disc height with disc desiccation and vacuum phenomenon as correlated with prior CT. There is annular disc bulging and a moderate-sized left paracentral disc protrusion. There is mass effect on the thecal sac  and left lateral recess with possible left L5 nerve root encroachment. The right lateral recess and both foramina are mildly narrowed. L5-S1: Disc height and hydration are maintained. Mild bilateral facet hypertrophy without resulting spinal stenosis or nerve root encroachment. IMPRESSION: 1. No acute osseous or ligamentous findings in the spine. No clear explanation for the patient's symptoms. 2. Symmetric foci of T2 hyperintensity in the cord at C5. 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 No cord edema or expansion. The conus medullaris and cauda equina appear normal. 3. Multilevel cervical spondylosis post anterior discectomy and fusion at C5-6. Mild spinal stenosis at C4-5. Multilevel cervical foraminal narrowing as detailed above. 4. Mild thoracic spine degenerative changes with small disc protrusions at T3-4 and T4-5. No cord deformity or foraminal  compromise. 5. Moderate-sized left paracentral disc protrusion at L4-5 with mass effect on the thecal sac and possible left L5 nerve root encroachment. Electronically Signed   By: Carey Bullocks M.D.   On: 01/09/2021 10:32        Scheduled Meds: . methylPREDNISolone  4 mg Oral PC lunch  . methylPREDNISolone  4 mg Oral PC supper  . [START ON 01/11/2021] methylPREDNISolone  4 mg Oral 3 x daily with food  . [START ON 01/12/2021] methylPREDNISolone  4 mg Oral 4X daily taper  . methylPREDNISolone  8 mg Oral Nightly  . [START ON 01/11/2021] methylPREDNISolone  8 mg Oral Nightly  . sodium chloride flush  3 mL Intravenous Q12H  . sodium chloride flush  3 mL Intravenous Q12H   Continuous Infusions: . sodium chloride       LOS: 1 day   Time spent= 25 mins    Dominyk Law Joline Maxcy, MD [facility]  If 7PM-7AM, please contact night-coverage  01/10/2021, 10:49 AM

## 2021-01-10 NOTE — Progress Notes (Signed)
IV cath was occluded and removed.  No IVF or IV meds ordered. Dr. Nelson Chimes made aware.  Per MD may keep IV out.

## 2021-01-10 NOTE — Consult Note (Signed)
Referring Physician:  No referring provider defined for this encounter.  Primary Physician:  Patient, No Pcp Per  Chief Complaint: left leg weakness, bilateral upper extremity numbness.    History of Present Illness: Tyler Winters is a 55 y.o. male who presents with the chief complaint of  left leg weakness, bilateral upper extremity numbness.  Patient had a fall approximately one month ago after falling on the ice on his back.  It appears he did lose consciousness. Since then, he has noted that his left leg is weaker.  No lower extremity pain or numbness. Also has had increased bilateral upper extremity numbness.  Of note, patient had an ACDF 2.5 years ago from which developed right upper extremity weakness and atrophy.    No bowel or bladder issues.     The symptoms are causing a significant impact on the patient's life.   Review of Systems:  A 10 point review of systems is negative, except for the pertinent positives and negatives detailed in the HPI.  Past Medical History: History reviewed. No pertinent past medical history.  Past Surgical History: Past Surgical History:  Procedure Laterality Date   SPINE SURGERY      Problem List: Patient Active Problem List   Diagnosis Date Noted   Left leg weakness 01/09/2021   Nicotine dependence 01/09/2021    Allergies: Allergies as of 01/08/2021 - Review Complete 01/08/2021  Allergen Reaction Noted   Morphine and related Itching 12/09/2020    Medications: @ENCMED @  Social History: Social History   Tobacco Use   Smoking status: Current Every Day Smoker    Types: Cigarettes   Smokeless tobacco: Never Used  Substance Use Topics   Alcohol use: Yes    Comment: 1 pack every 2 weeks   Drug use: Never    Family Medical History: Family History  Problem Relation Age of Onset   Hypertension Mother    Hypertension Father     Physical Examination: @VITALWITHPAIN @  General: Patient is well developed, well  nourished, calm, collected, and in no apparent distress.  Psychiatric: Patient is non-anxious.  Head:  Pupils equal, round, and reactive to light.  ENT:  Oral mucosa appears well hydrated.  Neck:   Supple.  Full range of motion.  Respiratory: Patient is breathing without any difficulty.  Extremities: No edema.  Vascular: Palpable pulses.  Skin:   On exposed skin, there are no abnormal skin lesions.  NEUROLOGICAL:  General: In no acute distress.   Awake, alert, oriented to person, place, and time.  Pupils equal round and reactive to light.  Facial tone is symmetric.  Tongue protrusion is midline.  There is no pronator drift.    Strength: Side Biceps Triceps Deltoid Interossei Grip Wrist Ext. Wrist Flex.  R 4 4 4 5 5 5 5   L 5 5 5 5 5 5 5    Side Iliopsoas Quads Hamstring PF DF EHL  R 5 5 5 5 5 5   L 4+ $+ 5 5 5 5    Reflexes are 2+ and symmetric at the biceps, triceps, brachioradialis, patella and achilles.   Bilateral upper and lower extremity sensation is intact to light touch and pin prick.  Clonus is not present.  Toes are down-going.  Gait is normal.  No difficulty with tandem gait.  Hoffman's is absent.  Imaging: MRI cervical spine: Cord: There are symmetric foci of T2 hyperintensity bilaterally in the cord at C5, best seen on axial image 15/4. There is no focal cord expansion  or atrophy at this level. No other cord signal abnormalities are identified. 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  Mild thoracic spine degenerative changes. There is a small central disc protrusion at T3-4 without cord deformity. There is a small left paracentral disc protrusion at T4-5. No other significant disc space findings are present. The neural foramina appear patent. No cord deformity.   Moderate-sized left paracentral disc protrusion at L4-5 with mass effect on the thecal sac and possible left L5 nerve  root encroachment.    I have personally reviewed the images and agree with the above interpretation.  Assessment and Plan: Tyler Winters is a pleasant 55 y.o. male with new left lower extremity weakness and bilateral upper extremity numbness.  I have discussed the condition with the patient, including showing the radiographs and discussing treatment options in layman's terms.    I have recommended the following:  1)  No acute surgical interventions warranted.  His symptoms are most likely due to aggravation of his cervical spinal cord injury.  2) Aggressive physical therapy.    I will see the patient back in a few weeks to gauge progress.  Thank you for involving me in the care of this patient. I will keep you apprised of the patient's progress.   This note was partially dictated using voice recognition software, so please excuse any errors that were not corrected.  Tyler Ao MD, PhD, Janetta Hora

## 2021-01-10 NOTE — Progress Notes (Signed)
Brief Neuro Update:  Reviewed MRI C,T,L spine. No cord compression. Does have T2 hyperintense focus at C5 likely residual from prior remote compressive myelopathy. Has degenerative changes at T3-4 and T4-5. Hasa moderate size left paracentral disc protrusion at L4-5 with mass effect on the thecal sac and possible left L5 nerve root encroachment.  I think the left L4-5 disc protrusion and posible encroachment on the left L5 nerve root resulting in Left L5 radiculopathy might explain the "dragging of the left foot" that he has been reporting since fall.  REcs: - Althou PT and OT did not recommend outpatient therapy, I think he will benefit from outpatient PT and OT for the noted L5 radiculopathy. - Recommend follow up with neurology outpatient with potential EMG with NCS to evaluate further for potential L5 radculopathy. - Recommend follow up with his previous spine surgeon who did his C spine fusion. - Neurology inpatient team will signoff. Please feel free to contact us with any questions or concerns.  Erick Blinks [facility] Pager Number [phone]

## 2021-01-11 LAB — COMPREHENSIVE METABOLIC PANEL
ALT: 22 U/L (ref 0–44)
AST: 17 U/L (ref 15–41)
Albumin: 3.8 g/dL (ref 3.5–5.0)
Alkaline Phosphatase: 57 U/L (ref 38–126)
Anion gap: 7 (ref 5–15)
BUN: 12 mg/dL (ref 6–20)
CO2: 21 mmol/L — ABNORMAL LOW (ref 22–32)
Calcium: 8.8 mg/dL — ABNORMAL LOW (ref 8.9–10.3)
Chloride: 107 mmol/L (ref 98–111)
Creatinine, Ser: 0.72 mg/dL (ref 0.61–1.24)
GFR, Estimated: >60 mL/min (ref >60)
Glucose, Bld: 112 mg/dL — ABNORMAL HIGH (ref 70–99)
Potassium: 4.6 mmol/L (ref 3.5–5.1)
Sodium: 135 mmol/L (ref 135–145)
Total Bilirubin: 0.7 mg/dL (ref 0.3–1.2)
Total Protein: 7 g/dL (ref 6.5–8.1)

## 2021-01-11 LAB — CBC
HCT: 42.6 % (ref 39.0–52.0)
Hemoglobin: 14.4 g/dL (ref 13.0–17.0)
MCH: 30.3 pg (ref 26.0–34.0)
MCHC: 33.8 g/dL (ref 30.0–36.0)
MCV: 89.5 fL (ref 80.0–100.0)
Platelets: 227 10*3/uL (ref 150–400)
RBC: 4.76 MIL/uL (ref 4.22–5.81)
RDW: 12.5 % (ref 11.5–15.5)
WBC: 8.2 10*3/uL (ref 4.0–10.5)
nRBC: 0 % (ref 0.0–0.2)

## 2021-01-11 LAB — MAGNESIUM: Magnesium: 2.1 mg/dL (ref 1.7–2.4)

## 2021-01-11 MED ORDER — METHYLPREDNISOLONE 4 MG PO TABS: ORAL_TABLET | ORAL | 0 refills | Status: AC

## 2021-01-11 NOTE — Discharge Summary (Signed)
Physician Discharge Summary  Tyler Winters [facility] FAO:[id number] DOB: 04/06/1966 DOA: 01/08/2021  PCP: Patient, No Pcp Per  Admit date: 01/08/2021 Discharge date: 01/11/2021  Admitted From: Home Disposition:  Home  Recommendations for Outpatient Follow-up:  1. Follow up with PCP in 1-2 weeks 2. Please obtain BMP/CBC in one week your next doctors visit.  3. Medrol Dosepak ordered 4. Follow-up outpatient with neurosurgery   Discharge Condition: Stable CODE STATUS: Full code Diet recommendation: Regular  Brief/Interim Summary: 55 year old male with no known past medical history sustained fall outpatient about 4 weeks ago now coming in with left lower extremity weakness.  Case was discussed with neurosurgery and neurology, started on Medrol Dosepak due to abnormal findings on MRI spine concerning for disc protrusion with L5 nerve root compression.  PT OT has been consulted who recommended outpatient therapy but patient declined.  Neurosurgery recommended outpatient follow-up. Stable for discharge today. Wife present at bedside during my discharge  Assessment & Plan:   Principal Problem:   Left leg weakness Active Problems:   Nicotine dependence  Left lower extremity weakness likely secondary to L4-5 disc protrusion with left L5 nerve root compression causing radiculopathy Cervical spine spinal stenosis, thoracic spine degenerative disease -Started patient on Medrol Dosepak.  PT/OT consulted-recommend outpatient therapy but patient declined -Seen by neurology and neurosurgery.  No further surgical intervention required.  Okay for discharge with outpatient follow-up  Tobacco use -Nicotine patch as needed   Body mass index is 31.8 kg/m.         Discharge Diagnoses:  Principal Problem:   Left leg weakness Active Problems:   Nicotine dependence      Consultations:  Neurology  Neurosurgery  Subjective: Feels great no complaints.  Able to walk and slowly  getting his strength back.  Discharge Exam: Vitals:   01/11/21 0523 01/11/21 0821  BP: 95/71 (!) 129/96  Pulse: 69 63  Resp: 16 16  Temp: 97.8 F (36.6 C) 98.7 F (37.1 C)  SpO2: 96% 97%   Vitals:   01/10/21 1949 01/11/21 0030 01/11/21 0523 01/11/21 0821  BP: 131/88 (!) 139/92 95/71 (!) 129/96  Pulse: (!) 58 65 69 63  Resp: Temp: 98.8 F (37.1 C) 98.6 F (37 C) 97.8 F (36.6 C) 98.7 F (37.1 C)  TempSrc: Oral Oral    SpO2: 99% 98% 96% 97%  Weight:      Height:        General: Pt is alert, awake, not in acute distress Cardiovascular: RRR, S1/S2 +, no rubs, no gallops Respiratory: CTA bilaterally, no wheezing, no rhonchi Abdominal: Soft, NT, ND, bowel sounds + Extremities: no edema, no cyanosis  Discharge Instructions   Allergies as of 01/11/2021      Reactions   Morphine And Related Itching      Medication List    TAKE these medications   methylPREDNISolone 4 MG tablet Commonly known as: Medrol Take 1 tablet (4 mg total) by mouth 4 (four) times daily for 1 day, THEN 1 tablet (4 mg total) 3 (three) times daily for 1 day, THEN 1 tablet (4 mg total) 2 (two) times daily for 1 day, THEN 1 tablet (4 mg total) daily for 1 day. Start taking on: January 11, 2021       Follow-up Information    Abd-El-Barr, Jerolyn Center, MD Follow up in 1 week(s).   Specialty: Neurosurgery Contact information: [address] [place] [address] [phone]  Allergies  Allergen Reactions  . Morphine And Related Itching    You were cared for by a hospitalist during your hospital stay. If you have any questions about your discharge medications or the care you received while you were in the hospital after you are discharged, you can call the unit and asked to speak with the hospitalist on call if the hospitalist that took care of you is not available. Once you are discharged, your primary care physician will handle any further medical issues. Please  note that no refills for any discharge medications will be authorized once you are discharged, as it is imperative that you return to your primary care physician (or establish a relationship with a primary care physician if you do not have one) for your aftercare needs so that they can reassess your need for medications and monitor your lab values.   Procedures/Studies: CT Cervical Spine Wo Contrast  Result Date: 01/09/2021 CLINICAL DATA:  about a month ago coming in for a fall and being diagnosed with a concussion. Pt states he now has a "pimp" walk to the left leg, with left leg weakness. EXAM: CT CERVICAL, THORACIC, AND LUMBAR SPINE WITHOUT CONTRAST TECHNIQUE: Multidetector CT imaging of the cervical, thoracic and lumbar spine was performed without intravenous contrast. Multiplanar CT image reconstructions were also generated. COMPARISON:  CT cervical spine 12/09/2020. FINDINGS: CT CERVICAL SPINE FINDINGS Alignment: Straightening of the normal cervical lordosis likely due to positioning and degenerative changes as well as surgical changes. Otherwise normal. Skull base and vertebrae: Anterior disc fusion at the C5-C6 level. Redemonstration of similar multilevel moderate severe degenerative changes of the spine with osteophyte formation and uncovertebral arthropathy. Multilevel severe osseous neural foraminal stenosis. No significant osseous central canal stenosis. No acute fracture. No aggressive appearing focal osseous lesion or focal pathologic process. Soft tissues and spinal canal: No prevertebral fluid or swelling. No visible canal hematoma. Upper chest: Unremarkable. Other: None. CT THORACIC SPINE FINDINGS Segmentation: 12 rib-bearing thoracic vertebral bodies. Alignment: Normal. Vertebrae: Mild multilevel osteophyte formation. No acute displaced fracture. No suspicious lytic or blastic osseous lesion. No neural foraminal central canal stenosis. Paraspinal and other soft tissues: Negative. Disc levels:  Maintained Other: None. CT LUMBAR SPINE FINDINGS Segmentation: 5 non-rib-bearing lumbar vertebral bodies. Alignment: Slight straightening of the normal cervical lordosis likely due to positioning. Vertebrae: Mild L3 through L5 osteophyte formation. Mild L5-S1 facet arthropathy. Posterior disc bulge at the L4-L5 level. No severe osseous neural foraminal or central canal stenosis. No acute displaced fracture. No suspicious lytic or blastic osseous lesions. Paraspinal and other soft tissues: Negative. Disc levels: Intervertebral disc space vacuum phenomenon at the L4-L5 level. Other: Visualized sacral spine unremarkable. Abdominal aorta atherosclerotic plaque. IMPRESSION: 1. No acute displaced fracture or traumatic listhesis of the cervical, thoracic, lumbar spine. 2. 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 3. Intervertebral disc space vacuum phenomenon and disc bulge at the L4-L5 level. Electronically Signed   By: Tish Frederickson M.D.   On: 01/09/2021 06:58   CT Thoracic Spine Wo Contrast  Result Date: 01/09/2021 CLINICAL DATA:  about a month ago coming in for a fall and being diagnosed with a concussion. Pt states he now has a "pimp" walk to the left leg, with left leg weakness. EXAM: CT CERVICAL, THORACIC, AND LUMBAR SPINE WITHOUT CONTRAST TECHNIQUE: Multidetector CT imaging of the cervical, thoracic and lumbar spine was performed without intravenous contrast. Multiplanar CT image reconstructions were also generated. COMPARISON:  CT cervical spine 12/09/2020. FINDINGS: CT CERVICAL SPINE FINDINGS Alignment: Straightening of the normal cervical lordosis likely due to positioning and degenerative changes as well as surgical changes. Otherwise normal. Skull base and vertebrae: Anterior disc fusion at the C5-C6 level. Redemonstration of similar multilevel moderate severe degenerative changes of the spine with osteophyte  formation and uncovertebral arthropathy. Multilevel severe osseous neural foraminal stenosis. No significant osseous central canal stenosis. No acute fracture. No aggressive appearing focal osseous lesion or focal pathologic process. Soft tissues and spinal canal: No prevertebral fluid or swelling. No visible canal hematoma. Upper chest: Unremarkable. Other: None. CT THORACIC SPINE FINDINGS Segmentation: 12 rib-bearing thoracic vertebral bodies. Alignment: Normal. Vertebrae: Mild multilevel osteophyte formation. No acute displaced fracture. No suspicious lytic or blastic osseous lesion. No neural foraminal central canal stenosis. Paraspinal and other soft tissues: Negative. Disc levels: Maintained Other: None. CT LUMBAR SPINE FINDINGS Segmentation: 5 non-rib-bearing lumbar vertebral bodies. Alignment: Slight straightening of the normal cervical lordosis likely due to positioning. Vertebrae: Mild L3 through L5 osteophyte formation. Mild L5-S1 facet arthropathy. Posterior disc bulge at the L4-L5 level. No severe osseous neural foraminal or central canal stenosis. No acute displaced fracture. No suspicious lytic or blastic osseous lesions. Paraspinal and other soft tissues: Negative. Disc levels: Intervertebral disc space vacuum phenomenon at the L4-L5 level. Other: Visualized sacral spine unremarkable. Abdominal aorta atherosclerotic plaque. IMPRESSION: 1. No acute displaced fracture or traumatic listhesis of the cervical, thoracic, lumbar spine. 2. 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 3. Intervertebral disc space vacuum phenomenon and disc bulge at the L4-L5 level. Electronically Signed   By: Tish Frederickson M.D.   On: 01/09/2021 06:58   CT Lumbar Spine Wo Contrast  Result Date: 01/09/2021 CLINICAL DATA:  about a month ago coming in for a fall and being diagnosed with a concussion. Pt states he now has a "pimp" walk  to the left leg, with left leg weakness. EXAM: CT CERVICAL, THORACIC, AND LUMBAR SPINE WITHOUT CONTRAST TECHNIQUE: Multidetector CT imaging of the cervical, thoracic and lumbar spine was performed without intravenous contrast. Multiplanar CT image reconstructions were also generated. COMPARISON:  CT cervical spine 12/09/2020. FINDINGS: CT CERVICAL SPINE FINDINGS Alignment: Straightening of the normal cervical lordosis likely due to positioning and degenerative changes as well as surgical changes. Otherwise normal. Skull base and vertebrae: Anterior disc fusion at the C5-C6 level. Redemonstration of similar multilevel moderate severe degenerative changes of the spine with osteophyte formation and uncovertebral arthropathy. Multilevel severe osseous neural foraminal stenosis. No significant osseous central canal stenosis. No acute fracture. No aggressive appearing focal osseous lesion or focal pathologic process. Soft tissues and spinal canal: No prevertebral fluid or swelling. No visible canal hematoma. Upper chest: Unremarkable. Other: None. CT THORACIC SPINE FINDINGS Segmentation: 12 rib-bearing thoracic vertebral bodies. Alignment: Normal. Vertebrae: Mild multilevel osteophyte formation. No acute displaced fracture. No suspicious lytic or blastic osseous lesion. No neural foraminal central canal stenosis. Paraspinal and other soft tissues: Negative. Disc levels: Maintained Other: None. CT LUMBAR SPINE FINDINGS Segmentation: 5 non-rib-bearing lumbar vertebral bodies. Alignment: Slight straightening of the normal cervical lordosis likely due to positioning. Vertebrae: Mild L3 through L5 osteophyte formation. Mild L5-S1 facet arthropathy. Posterior disc bulge at the L4-L5 level. No severe osseous neural foraminal or central canal stenosis. No acute displaced fracture. No suspicious lytic or blastic osseous lesions. Paraspinal and other soft tissues: Negative. Disc levels: Intervertebral disc space vacuum phenomenon  at the L4-L5 level. Other: Visualized  sacral spine unremarkable. Abdominal aorta atherosclerotic plaque. IMPRESSION: 1. No acute displaced fracture or traumatic listhesis of the cervical, thoracic, lumbar spine. 2. 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 3. Intervertebral disc space vacuum phenomenon and disc bulge at the L4-L5 level. Electronically Signed   By: Tish Frederickson M.D.   On: 01/09/2021 06:58   MR ANGIO HEAD WO CONTRAST  Result Date: 01/09/2021 CLINICAL DATA:  Left lower extremity numbness. Possible recent stroke. EXAM: MRI HEAD WITHOUT CONTRAST MRA HEAD WITHOUT CONTRAST TECHNIQUE: Multiplanar, multiecho pulse sequences of the brain and surrounding structures were obtained without intravenous contrast. Angiographic images of the head were obtained using MRA technique without contrast. COMPARISON:  None. FINDINGS: MRI HEAD FINDINGS Brain: No acute infarct, mass effect or extra-axial collection. No acute or chronic hemorrhage. Normal white matter signal, parenchymal volume and CSF spaces. The midline structures are normal. Vascular: Major flow voids are preserved. Skull and upper cervical spine: Normal calvarium and skull base. Visualized upper cervical spine and soft tissues are normal. Sinuses/Orbits:No paranasal sinus fluid levels or advanced mucosal thickening. No mastoid or middle ear effusion. Normal orbits. MRA HEAD FINDINGS POSTERIOR CIRCULATION: --Vertebral arteries: Normal --Inferior cerebellar arteries: Normal. --Basilar artery: Normal. --Superior cerebellar arteries: Normal. --Posterior cerebral arteries: Normal. ANTERIOR CIRCULATION: --Intracranial internal carotid arteries: Normal. --Anterior cerebral arteries (ACA): Normal. --Middle cerebral arteries (MCA): Normal. ANATOMIC VARIANTS: None IMPRESSION: Normal MRI/MRA of the brain. Electronically Signed   By: Deatra Robinson M.D.   On: 01/09/2021  02:25   MR Brain W and Wo Contrast  Result Date: 01/09/2021 CLINICAL DATA:  Left lower extremity numbness. Possible recent stroke. EXAM: MRI HEAD WITHOUT CONTRAST MRA HEAD WITHOUT CONTRAST TECHNIQUE: Multiplanar, multiecho pulse sequences of the brain and surrounding structures were obtained without intravenous contrast. Angiographic images of the head were obtained using MRA technique without contrast. COMPARISON:  None. FINDINGS: MRI HEAD FINDINGS Brain: No acute infarct, mass effect or extra-axial collection. No acute or chronic hemorrhage. Normal white matter signal, parenchymal volume and CSF spaces. The midline structures are normal. Vascular: Major flow voids are preserved. Skull and upper cervical spine: Normal calvarium and skull base. Visualized upper cervical spine and soft tissues are normal. Sinuses/Orbits:No paranasal sinus fluid levels or advanced mucosal thickening. No mastoid or middle ear effusion. Normal orbits. MRA HEAD FINDINGS POSTERIOR CIRCULATION: --Vertebral arteries: Normal --Inferior cerebellar arteries: Normal. --Basilar artery: Normal. --Superior cerebellar arteries: Normal. --Posterior cerebral arteries: Normal. ANTERIOR CIRCULATION: --Intracranial internal carotid arteries: Normal. --Anterior cerebral arteries (ACA): Normal. --Middle cerebral arteries (MCA): Normal. ANATOMIC VARIANTS: None IMPRESSION: Normal MRI/MRA of the brain. Electronically Signed   By: Deatra Robinson M.D.   On: 01/09/2021 02:25   MR Cervical Spine Wo Contrast  Result Date: 01/09/2021 CLINICAL DATA:  Left leg weakness following a fall with loss of consciousness approximately 4 weeks prior. Unsteady gait. Evaluate for cord compression and cauda equina syndrome. EXAM: MRI CERVICAL, THORACIC AND LUMBAR SPINE WITHOUT CONTRAST TECHNIQUE: Multiplanar and multiecho pulse sequences of the cervical spine, to include the craniocervical junction and cervicothoracic junction, and thoracic and lumbar spine, were obtained  without intravenous contrast. COMPARISON:  CTs of the cervical, thoracic and lumbar spine same date. FINDINGS: MRI CERVICAL SPINE FINDINGS Alignment: Straightening without focal angulation or listhesis. Vertebrae: No evidence of acute fracture or focal osseous lesion. Previous anterior discectomy and fusion at C5-6 with solid interbody fusion as correlated with preceding CT. Cord: There are symmetric foci of T2 hyperintensity bilaterally in  the cord at C5, best seen on axial image 15/4. There is no focal cord expansion or atrophy at this level. No other cord signal abnormalities are identified. 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 Posterior Fossa, vertebral arteries, paraspinal tissues: The craniocervical junction appears normal. There are bilateral vertebral artery flow voids. No paraspinal abnormalities. Disc levels: C2-3: Mild disc bulging and uncinate spurring. Mild foraminal narrowing bilaterally. No cord deformity. C3-4: Loss of disc height with disc bulging, uncinate spurring and facet hypertrophy bilaterally. Moderate biforaminal narrowing. No cord deformity. C4-5: Loss of disc height with disc bulging, uncinate spurring and bilateral facet hypertrophy. The CSF surrounding the cord is effaced with narrowing the AP diameter of the canal to 7 mm. As above, just below the disc space, there is symmetric T2 hyperintensity in the cord bilaterally. Moderate to severe foraminal narrowing bilaterally. C5-6: Status post anterior discectomy and fusion. Residual uncinate spurring contributes to moderate foraminal narrowing bilaterally. No cord deformity. C6-7: Loss of disc height with disc bulging, uncinate spurring and bilateral facet hypertrophy. Moderate to severe osseous foraminal narrowing bilaterally. No cord deformity. C7-T1: No significant findings. MRI THORACIC SPINE FINDINGS Alignment:  Physiologic. Vertebrae: No acute or suspicious osseous  findings. Cord:  The thoracic cord appears normal in signal and caliber. Paraspinal and other soft tissues: No significant paraspinal abnormalities. Disc levels: Mild thoracic spine degenerative changes. There is a small central disc protrusion at T3-4 without cord deformity. There is a small left paracentral disc protrusion at T4-5. No other significant disc space findings are present. The neural foramina appear patent. No cord deformity. MRI LUMBAR SPINE FINDINGS Segmentation: There are 5 lumbar type vertebral bodies. Alignment:  Straightening without focal angulation or listhesis. Vertebrae: No worrisome osseous lesion, acute fracture or pars defect. The visualized sacroiliac joints appear unremarkable. Conus medullaris: Extends to the L1 level and appears normal. Paraspinal and other soft tissues: No significant paraspinal findings. Disc levels: No significant disc space findings from T12-L1 through L2-3. L3-4: Mild disc bulging and mild right foraminal narrowing. No nerve root encroachment. L4-5: Loss of disc height with disc desiccation and vacuum phenomenon as correlated with prior CT. There is annular disc bulging and a moderate-sized left paracentral disc protrusion. There is mass effect on the thecal sac and left lateral recess with possible left L5 nerve root encroachment. The right lateral recess and both foramina are mildly narrowed. L5-S1: Disc height and hydration are maintained. Mild bilateral facet hypertrophy without resulting spinal stenosis or nerve root encroachment. IMPRESSION: 1. No acute osseous or ligamentous findings in the spine. No clear explanation for the patient's symptoms. 2. Symmetric foci of T2 hyperintensity in the cord at C5. 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 No cord edema or expansion. The conus medullaris and cauda equina appear normal. 3. Multilevel cervical spondylosis post anterior discectomy and fusion  at C5-6. Mild spinal stenosis at C4-5. Multilevel cervical foraminal narrowing as detailed above. 4. Mild thoracic spine degenerative changes with small disc protrusions at T3-4 and T4-5. No cord deformity or foraminal compromise. 5. Moderate-sized left paracentral disc protrusion at L4-5 with mass effect on the thecal sac and possible left L5 nerve root encroachment. Electronically Signed   By: Carey Bullocks M.D.   On: 01/09/2021 10:32   MR THORACIC SPINE WO CONTRAST  Result Date: 01/09/2021 CLINICAL DATA:  Left leg weakness following a fall with loss of consciousness approximately  4 weeks prior. Unsteady gait. Evaluate for cord compression and cauda equina syndrome. EXAM: MRI CERVICAL, THORACIC AND LUMBAR SPINE WITHOUT CONTRAST TECHNIQUE: Multiplanar and multiecho pulse sequences of the cervical spine, to include the craniocervical junction and cervicothoracic junction, and thoracic and lumbar spine, were obtained without intravenous contrast. COMPARISON:  CTs of the cervical, thoracic and lumbar spine same date. FINDINGS: MRI CERVICAL SPINE FINDINGS Alignment: Straightening without focal angulation or listhesis. Vertebrae: No evidence of acute fracture or focal osseous lesion. Previous anterior discectomy and fusion at C5-6 with solid interbody fusion as correlated with preceding CT. Cord: There are symmetric foci of T2 hyperintensity bilaterally in the cord at C5, best seen on axial image 15/4. There is no focal cord expansion or atrophy at this level. No other cord signal abnormalities are identified. 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 Posterior Fossa, vertebral arteries, paraspinal tissues: The craniocervical junction appears normal. There are bilateral vertebral artery flow voids. No paraspinal abnormalities. Disc levels: C2-3: Mild disc bulging and uncinate spurring. Mild foraminal narrowing bilaterally. No cord deformity. C3-4:  Loss of disc height with disc bulging, uncinate spurring and facet hypertrophy bilaterally. Moderate biforaminal narrowing. No cord deformity. C4-5: Loss of disc height with disc bulging, uncinate spurring and bilateral facet hypertrophy. The CSF surrounding the cord is effaced with narrowing the AP diameter of the canal to 7 mm. As above, just below the disc space, there is symmetric T2 hyperintensity in the cord bilaterally. Moderate to severe foraminal narrowing bilaterally. C5-6: Status post anterior discectomy and fusion. Residual uncinate spurring contributes to moderate foraminal narrowing bilaterally. No cord deformity. C6-7: Loss of disc height with disc bulging, uncinate spurring and bilateral facet hypertrophy. Moderate to severe osseous foraminal narrowing bilaterally. No cord deformity. C7-T1: No significant findings. MRI THORACIC SPINE FINDINGS Alignment:  Physiologic. Vertebrae: No acute or suspicious osseous findings. Cord:  The thoracic cord appears normal in signal and caliber. Paraspinal and other soft tissues: No significant paraspinal abnormalities. Disc levels: Mild thoracic spine degenerative changes. There is a small central disc protrusion at T3-4 without cord deformity. There is a small left paracentral disc protrusion at T4-5. No other significant disc space findings are present. The neural foramina appear patent. No cord deformity. MRI LUMBAR SPINE FINDINGS Segmentation: There are 5 lumbar type vertebral bodies. Alignment:  Straightening without focal angulation or listhesis. Vertebrae: No worrisome osseous lesion, acute fracture or pars defect. The visualized sacroiliac joints appear unremarkable. Conus medullaris: Extends to the L1 level and appears normal. Paraspinal and other soft tissues: No significant paraspinal findings. Disc levels: No significant disc space findings from T12-L1 through L2-3. L3-4: Mild disc bulging and mild right foraminal narrowing. No nerve root encroachment.  L4-5: Loss of disc height with disc desiccation and vacuum phenomenon as correlated with prior CT. There is annular disc bulging and a moderate-sized left paracentral disc protrusion. There is mass effect on the thecal sac and left lateral recess with possible left L5 nerve root encroachment. The right lateral recess and both foramina are mildly narrowed. L5-S1: Disc height and hydration are maintained. Mild bilateral facet hypertrophy without resulting spinal stenosis or nerve root encroachment. IMPRESSION: 1. No acute osseous or ligamentous findings in the spine. No clear explanation for the patient's symptoms. 2. Symmetric foci of T2 hyperintensity in the cord at C5. 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 No cord edema or expansion. The conus  medullaris and cauda equina appear normal. 3. Multilevel cervical spondylosis post anterior discectomy and fusion at C5-6. Mild spinal stenosis at C4-5. Multilevel cervical foraminal narrowing as detailed above. 4. Mild thoracic spine degenerative changes with small disc protrusions at T3-4 and T4-5. No cord deformity or foraminal compromise. 5. Moderate-sized left paracentral disc protrusion at L4-5 with mass effect on the thecal sac and possible left L5 nerve root encroachment. Electronically Signed   By: Carey Bullocks M.D.   On: 01/09/2021 10:32   MR LUMBAR SPINE WO CONTRAST  Result Date: 01/09/2021 CLINICAL DATA:  Left leg weakness following a fall with loss of consciousness approximately 4 weeks prior. Unsteady gait. Evaluate for cord compression and cauda equina syndrome. EXAM: MRI CERVICAL, THORACIC AND LUMBAR SPINE WITHOUT CONTRAST TECHNIQUE: Multiplanar and multiecho pulse sequences of the cervical spine, to include the craniocervical junction and cervicothoracic junction, and thoracic and lumbar spine, were obtained without intravenous contrast. COMPARISON:  CTs of the cervical, thoracic and  lumbar spine same date. FINDINGS: MRI CERVICAL SPINE FINDINGS Alignment: Straightening without focal angulation or listhesis. Vertebrae: No evidence of acute fracture or focal osseous lesion. Previous anterior discectomy and fusion at C5-6 with solid interbody fusion as correlated with preceding CT. Cord: There are symmetric foci of T2 hyperintensity bilaterally in the cord at C5, best seen on axial image 15/4. There is no focal cord expansion or atrophy at this level. No other cord signal abnormalities are identified. 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 Posterior Fossa, vertebral arteries, paraspinal tissues: The craniocervical junction appears normal. There are bilateral vertebral artery flow voids. No paraspinal abnormalities. Disc levels: C2-3: Mild disc bulging and uncinate spurring. Mild foraminal narrowing bilaterally. No cord deformity. C3-4: Loss of disc height with disc bulging, uncinate spurring and facet hypertrophy bilaterally. Moderate biforaminal narrowing. No cord deformity. C4-5: Loss of disc height with disc bulging, uncinate spurring and bilateral facet hypertrophy. The CSF surrounding the cord is effaced with narrowing the AP diameter of the canal to 7 mm. As above, just below the disc space, there is symmetric T2 hyperintensity in the cord bilaterally. Moderate to severe foraminal narrowing bilaterally. C5-6: Status post anterior discectomy and fusion. Residual uncinate spurring contributes to moderate foraminal narrowing bilaterally. No cord deformity. C6-7: Loss of disc height with disc bulging, uncinate spurring and bilateral facet hypertrophy. Moderate to severe osseous foraminal narrowing bilaterally. No cord deformity. C7-T1: No significant findings. MRI THORACIC SPINE FINDINGS Alignment:  Physiologic. Vertebrae: No acute or suspicious osseous findings. Cord:  The thoracic cord appears normal in signal and caliber.  Paraspinal and other soft tissues: No significant paraspinal abnormalities. Disc levels: Mild thoracic spine degenerative changes. There is a small central disc protrusion at T3-4 without cord deformity. There is a small left paracentral disc protrusion at T4-5. No other significant disc space findings are present. The neural foramina appear patent. No cord deformity. MRI LUMBAR SPINE FINDINGS Segmentation: There are 5 lumbar type vertebral bodies. Alignment:  Straightening without focal angulation or listhesis. Vertebrae: No worrisome osseous lesion, acute fracture or pars defect. The visualized sacroiliac joints appear unremarkable. Conus medullaris: Extends to the L1 level and appears normal. Paraspinal and other soft tissues: No significant paraspinal findings. Disc levels: No significant disc space findings from T12-L1 through L2-3. L3-4: Mild disc bulging and mild right foraminal narrowing. No nerve root encroachment. L4-5: Loss of disc height with disc desiccation and vacuum phenomenon as correlated with prior CT. There is  annular disc bulging and a moderate-sized left paracentral disc protrusion. There is mass effect on the thecal sac and left lateral recess with possible left L5 nerve root encroachment. The right lateral recess and both foramina are mildly narrowed. L5-S1: Disc height and hydration are maintained. Mild bilateral facet hypertrophy without resulting spinal stenosis or nerve root encroachment. IMPRESSION: 1. No acute osseous or ligamentous findings in the spine. No clear explanation for the patient's symptoms. 2. Symmetric foci of T2 hyperintensity in the cord at C5. 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 No cord edema or expansion. The conus medullaris and cauda equina appear normal. 3. Multilevel cervical spondylosis post anterior discectomy and fusion at C5-6. Mild spinal stenosis at C4-5. Multilevel cervical foraminal  narrowing as detailed above. 4. Mild thoracic spine degenerative changes with small disc protrusions at T3-4 and T4-5. No cord deformity or foraminal compromise. 5. Moderate-sized left paracentral disc protrusion at L4-5 with mass effect on the thecal sac and possible left L5 nerve root encroachment. Electronically Signed   By: Carey Bullocks M.D.   On: 01/09/2021 10:32      The results of significant diagnostics from this hospitalization (including imaging, microbiology, ancillary and laboratory) are listed below for reference.     Microbiology: Recent Results (from the past 240 hour(s))  Resp Panel by RT-PCR (Flu A&B, Covid) Nasopharyngeal Swab     Status: None   Collection Time: 01/09/21  7:52 AM   Specimen: Nasopharyngeal Swab; Nasopharyngeal(NP) swabs in vial transport medium  Result Value Ref Range Status   SARS Coronavirus 2 by RT PCR NEGATIVE NEGATIVE Final    Comment: (NOTE) SARS-CoV-2 target nucleic acids are NOT DETECTED.  The SARS-CoV-2 RNA is generally detectable in upper respiratory specimens during the acute phase of infection. The lowest concentration of SARS-CoV-2 viral copies this assay can detect is 138 copies/mL. A negative result does not preclude SARS-Cov-2 infection and should not be used as the sole basis for treatment or other patient management decisions. A negative result may occur with  improper specimen collection/handling, submission of specimen other than nasopharyngeal swab, presence of viral mutation(s) within the areas targeted by this assay, and inadequate number of viral copies(<138 copies/mL). A negative result must be combined with clinical observations, patient history, and epidemiological information. The expected result is Negative.  Fact Sheet for Patients:  BloggerCourse.com  Fact Sheet for Healthcare Providers:  SeriousBroker.it  This test is no t yet approved or cleared by the [place] FDA and  has been authorized for detection and/or diagnosis of SARS-CoV-2 by FDA under an Emergency Use Authorization (EUA). This EUA will remain  in effect (meaning this test can be used) for the duration of the COVID-19 declaration under Section 564(b)(1) of the Act, 21 U.S.C.section 360bbb-3(b)(1), unless the authorization is terminated  or revoked sooner.       Influenza A by PCR NEGATIVE NEGATIVE Final   Influenza B by PCR NEGATIVE NEGATIVE Final    Comment: (NOTE) The Xpert Xpress SARS-CoV-2/FLU/RSV plus assay is intended as an aid in the diagnosis of influenza from Nasopharyngeal swab specimens and should not be used as a sole basis for treatment. Nasal washings and aspirates are unacceptable for Xpert Xpress SARS-CoV-2/FLU/RSV testing.  Fact Sheet for Patients: BloggerCourse.com  Fact Sheet for Healthcare Providers: SeriousBroker.it  This test is not yet approved or cleared by the [place] FDA and has been authorized for detection and/or diagnosis of SARS-CoV-2 by  FDA under an Emergency Use Authorization (EUA). This EUA will remain in effect (meaning this test can be used) for the duration of the COVID-19 declaration under Section 564(b)(1) of the Act, 21 U.S.C. section 360bbb-3(b)(1), unless the authorization is terminated or revoked.  Performed at [facility], [address] [place], [place] [address]      Labs: BNP (last 3 results) No results for input(s): BNP in the last 8760 hours. Basic Metabolic Panel: Recent Labs  Lab 01/08/21 1926 01/10/21 0505 01/11/21 0508  NA 141 139 135  K 3.9 3.9 4.6  CL 109 108 107  CO2 24 23 21*  GLUCOSE 99 90 112*  BUN 12 12 12   CREATININE 0.87 0.79 0.72  CALCIUM 8.9 8.8* 8.8*  MG  --   --  2.1   Liver Function Tests: Recent Labs  Lab 01/11/21 0508  AST 17  ALT 22  ALKPHOS 57  BILITOT 0.7  PROT 7.0  ALBUMIN 3.8   No results for  input(s): LIPASE, AMYLASE in the last 168 hours. No results for input(s): AMMONIA in the last 168 hours. CBC: Recent Labs  Lab 01/08/21 1926 01/10/21 0505 01/11/21 0508  WBC 4.6 4.3 8.2  HGB 14.4 14.0 14.4  HCT 43.5 41.4 42.6  MCV 89.7 88.5 89.5  PLT 252 236 227   Cardiac Enzymes: No results for input(s): CKTOTAL, CKMB, CKMBINDEX, TROPONINI in the last 168 hours. BNP: Invalid input(s): POCBNP CBG: No results for input(s): GLUCAP in the last 168 hours. D-Dimer No results for input(s): DDIMER in the last 72 hours. Hgb A1c No results for input(s): HGBA1C in the last 72 hours. Lipid Profile No results for input(s): CHOL, HDL, LDLCALC, TRIG, CHOLHDL, LDLDIRECT in the last 72 hours. Thyroid function studies No results for input(s): TSH, T4TOTAL, T3FREE, THYROIDAB in the last 72 hours.  Invalid input(s): FREET3 Anemia work up No results for input(s): VITAMINB12, FOLATE, FERRITIN, TIBC, IRON, RETICCTPCT in the last 72 hours. Urinalysis No results found for: COLORURINE, APPEARANCEUR, LABSPEC, PHURINE, GLUCOSEU, HGBUR, BILIRUBINUR, KETONESUR, PROTEINUR, UROBILINOGEN, NITRITE, LEUKOCYTESUR Sepsis Labs Invalid input(s): PROCALCITONIN,  WBC,  LACTICIDVEN Microbiology Recent Results (from the past 240 hour(s))  Resp Panel by RT-PCR (Flu A&B, Covid) Nasopharyngeal Swab     Status: None   Collection Time: 01/09/21  7:52 AM   Specimen: Nasopharyngeal Swab; Nasopharyngeal(NP) swabs in vial transport medium  Result Value Ref Range Status   SARS Coronavirus 2 by RT PCR NEGATIVE NEGATIVE Final    Comment: (NOTE) SARS-CoV-2 target nucleic acids are NOT DETECTED.  The SARS-CoV-2 RNA is generally detectable in upper respiratory specimens during the acute phase of infection. The lowest concentration of SARS-CoV-2 viral copies this assay can detect is 138 copies/mL. A negative result does not preclude SARS-Cov-2 infection and should not be used as the sole basis for treatment or other  patient management decisions. A negative result may occur with  improper specimen collection/handling, submission of specimen other than nasopharyngeal swab, presence of viral mutation(s) within the areas targeted by this assay, and inadequate number of viral copies(<138 copies/mL). A negative result must be combined with clinical observations, patient history, and epidemiological information. The expected result is Negative.  Fact Sheet for Patients:  BloggerCourse.com  Fact Sheet for Healthcare Providers:  SeriousBroker.it  This test is no t yet approved or cleared by the [place] FDA and  has been authorized for detection and/or diagnosis of SARS-CoV-2 by FDA under an Emergency Use Authorization (EUA). This EUA will remain  in  effect (meaning this test can be used) for the duration of the COVID-19 declaration under Section 564(b)(1) of the Act, 21 U.S.C.section 360bbb-3(b)(1), unless the authorization is terminated  or revoked sooner.       Influenza A by PCR NEGATIVE NEGATIVE Final   Influenza B by PCR NEGATIVE NEGATIVE Final    Comment: (NOTE) The Xpert Xpress SARS-CoV-2/FLU/RSV plus assay is intended as an aid in the diagnosis of influenza from Nasopharyngeal swab specimens and should not be used as a sole basis for treatment. Nasal washings and aspirates are unacceptable for Xpert Xpress SARS-CoV-2/FLU/RSV testing.  Fact Sheet for Patients: BloggerCourse.com  Fact Sheet for Healthcare Providers: SeriousBroker.it  This test is not yet approved or cleared by the [place] FDA and has been authorized for detection and/or diagnosis of SARS-CoV-2 by FDA under an Emergency Use Authorization (EUA). This EUA will remain in effect (meaning this test can be used) for the duration of the COVID-19 declaration under Section 564(b)(1) of the Act, 21 U.S.C. section  360bbb-3(b)(1), unless the authorization is terminated or revoked.  Performed at [facility], [address] [place], [place] [address]      Time coordinating discharge:  I have spent 35 minutes face to face with the patient and on the ward discussing the patients care, assessment, plan and disposition with other care givers. >50% of the time was devoted counseling the patient about the risks and benefits of treatment/Discharge disposition and coordinating care.   SIGNED:   Dimple Nanas, MD  [facility] 01/11/2021, 12:01 PM   If 7PM-7AM, please contact night-coverage

## 2021-01-11 NOTE — Progress Notes (Signed)
Pt is being discharged home.  Discharge papers given and explained to pt. Pt verbalized understanding. Meds and f/u appointments reviewed. Rx sent electronically to pharmacy. Pt made aware.  

## 2021-01-11 NOTE — Progress Notes (Signed)
Physical Therapy Treatment Patient Details Name: Tyler Winters MRN: [id number] DOB: 1966/09/01 Today's Date: 01/11/2021    History of Present Illness Glenn Gullickson is a 55yoM who comes to [facility] on 2/17 after acute onset pimp-style walking of Left leg. Pt reports a fall 4WA on ice, posterior head impact, LOC x 3 mnutes. At that time, head and cervical CT negative. Pt reports recent increase urge incontinence of urine, denies any saddle anesthesia. Pt seen by neurology, who notes grossly hypereflexia, has ordered stat C/T/L MRI to r/o cord compression.    PT Comments    Per neurology notes pt with "left lower extremity weakness likely 2/2 to L4-5 disc protrusion with left L5 nerve room compression causing radiculopathy".   Pt seen for PT treatment with pt standing in room, dressing himself without assistance. Pt ambulates a lap around nurses station with impaired gait pattern as noted below. Provided pt with & applied ace wrap to LLE for dorsiflexion assist to simulate foot-up brace. Pt weary of ambulating with this 2/2 decreased traction on shoe but the ~40ft he did ambulate in room pt appeared to have more normalized gait pattern. Educated pt on benefits of OPPT f/u & potential need for LE bracing to facilitate more normalized gait pattern until radiculopathy improves & pt agreeable. Pt also negotiated stairs with recommendations noted below.     Follow Up Recommendations  Outpatient PT     Equipment Recommendations  None recommended by PT    Recommendations for Other Services       Precautions / Restrictions Precautions Precautions: None Restrictions Weight Bearing Restrictions: No    Mobility  Bed Mobility               General bed mobility comments: not observed, pt received standing in room & left sitting in chair    Transfers Overall transfer level: Modified independent Equipment used: None                Ambulation/Gait Ambulation/Gait assistance:  Modified independent (Device/Increase time) Gait Distance (Feet): 200 Feet Assistive device: None Gait Pattern/deviations: Decreased dorsiflexion - left     General Gait Details: Decreased L hip & knee flexion during swing phase, excessive weight shift to R to allow LLE clearance & advancement. Pt with visible myoclonus in proximal portion of LLE during swing phase.   Stairs Stairs: Yes Stairs assistance: Supervision Stair Management: One rail Left (L descending rail) Number of Stairs: 15 General stair comments: Pt elects to ambulate with step-over-step pattern with L toe catching on each step. PT educated pt on benefits & increased safety of step-to pattern leading with RLE for ascending stairs & encouraged him to have wife provide supervision when negotiating stairs at home.   Wheelchair Mobility    Modified Rankin (Stroke Patients Only)       Balance Overall balance assessment: Modified Independent                                          Cognition Arousal/Alertness: Awake/alert Behavior During Therapy: WFL for tasks assessed/performed Overall Cognitive Status: Within Functional Limits for tasks assessed                                        Exercises      General Comments  Pertinent Vitals/Pain Pain Assessment: No/denies pain    Home Living                      Prior Function            PT Goals (current goals can now be found in the care plan section) Acute Rehab PT Goals Patient Stated Goal: go home, return to PLOF PT Goal Formulation: With patient Time For Goal Achievement: 01/25/21 Potential to Achieve Goals: Good Progress towards PT goals: Progressing toward goals    Frequency    Min 2X/week      PT Plan Discharge plan needs to be updated    Co-evaluation              AM-PAC PT "6 Clicks" Mobility   Outcome Measure  Help needed turning from your back to your side while in a flat  bed without using bedrails?: None Help needed moving from lying on your back to sitting on the side of a flat bed without using bedrails?: None Help needed moving to and from a bed to a chair (including a wheelchair)?: None Help needed standing up from a chair using your arms (e.g., wheelchair or bedside chair)?: None Help needed to walk in hospital room?: None Help needed climbing 3-5 steps with a railing? : A Little 6 Click Score: 23    End of Session Equipment Utilized During Treatment: Gait belt Activity Tolerance: Patient tolerated treatment well Patient left: in chair;with family/visitor present   PT Visit Diagnosis: Difficulty in walking, not elsewhere classified (R26.2);Other symptoms and signs involving the nervous system (R29.898);Other abnormalities of gait and mobility (R26.89)     Time: 3536-1443 PT Time Calculation (min) (ACUTE ONLY): 20 min  Charges:  $Gait Training: 8-22 mins                     Aleda Grana, PT, DPT 01/11/21, 10:59 AM    Sandi Mariscal 01/11/2021, 10:57 AM

## 2021-10-26 DIAGNOSIS — M539 Dorsopathy, unspecified: Secondary | ICD-10-CM | POA: Insufficient documentation

## 2021-10-26 DIAGNOSIS — G959 Disease of spinal cord, unspecified: Secondary | ICD-10-CM | POA: Insufficient documentation

## 2021-11-13 LAB — COLOGUARD: COLOGUARD: NEGATIVE

## 2021-11-16 LAB — COLOGUARD: Cologuard: NEGATIVE

## 2022-02-23 ENCOUNTER — Other Ambulatory Visit: Payer: Self-pay

## 2022-02-23 ENCOUNTER — Encounter: Payer: Self-pay | Admitting: Emergency Medicine

## 2022-02-23 ENCOUNTER — Ambulatory Visit
Admission: EM | Admit: 2022-02-23 | Discharge: 2022-02-23 | Disposition: A | Discharge status: Home / Self Care | Payer: 59

## 2022-02-23 ENCOUNTER — Emergency Department: Payer: 59

## 2022-02-23 DIAGNOSIS — R110 Nausea: Secondary | ICD-10-CM | POA: Diagnosis not present

## 2022-02-23 DIAGNOSIS — R030 Elevated blood-pressure reading, without diagnosis of hypertension: Secondary | ICD-10-CM | POA: Diagnosis not present

## 2022-02-23 DIAGNOSIS — R42 Dizziness and giddiness: Secondary | ICD-10-CM

## 2022-02-23 DIAGNOSIS — H538 Other visual disturbances: Secondary | ICD-10-CM | POA: Diagnosis not present

## 2022-02-23 LAB — DIFFERENTIAL
Abs Immature Granulocytes: 0.01 10*3/uL (ref 0.00–0.07)
Basophils Absolute: 0 10*3/uL (ref 0.0–0.1)
Basophils Relative: 1 %
Eosinophils Absolute: 0.1 10*3/uL (ref 0.0–0.5)
Eosinophils Relative: 3 %
Immature Granulocytes: 0 %
Lymphocytes Relative: 22 %
Lymphs Abs: 1 10*3/uL (ref 0.7–4.0)
Monocytes Absolute: 0.3 10*3/uL (ref 0.1–1.0)
Monocytes Relative: 8 %
Neutro Abs: 3 10*3/uL (ref 1.7–7.7)
Neutrophils Relative %: 66 %

## 2022-02-23 LAB — CBC
HCT: 44.5 % (ref 39.0–52.0)
Hemoglobin: 14.5 g/dL (ref 13.0–17.0)
MCH: 29.7 pg (ref 26.0–34.0)
MCHC: 32.6 g/dL (ref 30.0–36.0)
MCV: 91 fL (ref 80.0–100.0)
Platelets: 257 10*3/uL (ref 150–400)
RBC: 4.89 MIL/uL (ref 4.22–5.81)
RDW: 13 % (ref 11.5–15.5)
WBC: 4.5 10*3/uL (ref 4.0–10.5)
nRBC: 0 % (ref 0.0–0.2)

## 2022-02-23 LAB — CBG MONITORING, ED: Glucose-Capillary: 128 mg/dL — ABNORMAL HIGH (ref 70–99)

## 2022-02-23 LAB — COMPREHENSIVE METABOLIC PANEL
ALT: 25 U/L (ref 0–44)
AST: 23 U/L (ref 15–41)
Albumin: 3.8 g/dL (ref 3.5–5.0)
Alkaline Phosphatase: 64 U/L (ref 38–126)
Anion gap: 6 (ref 5–15)
BUN: 9 mg/dL (ref 6–20)
CO2: 23 mmol/L (ref 22–32)
Calcium: 8.8 mg/dL — ABNORMAL LOW (ref 8.9–10.3)
Chloride: 110 mmol/L (ref 98–111)
Creatinine, Ser: 0.76 mg/dL (ref 0.61–1.24)
GFR, Estimated: >60 mL/min (ref >60)
Glucose, Bld: 99 mg/dL (ref 70–99)
Potassium: 3.9 mmol/L (ref 3.5–5.1)
Sodium: 139 mmol/L (ref 135–145)
Total Bilirubin: 0.6 mg/dL (ref 0.3–1.2)
Total Protein: 7.5 g/dL (ref 6.5–8.1)

## 2022-02-23 LAB — PROTIME-INR
INR: 1 (ref 0.8–1.2)
Prothrombin Time: 12.7 seconds (ref 11.4–15.2)

## 2022-02-23 LAB — APTT: aPTT: 33 seconds (ref 24–36)

## 2022-02-23 LAB — TROPONIN I (HIGH SENSITIVITY)
Troponin I (High Sensitivity): 4 ng/L (ref <18)
Troponin I (High Sensitivity): 4 ng/L (ref <18)

## 2022-02-23 IMAGING — CT CT HEAD W/O CM
4 series · 17 of 47 positions shown, 19 images · non-contrast
Comparison: CT examination dated [DATE]; MRI examination
dated [DATE]

CLINICAL DATA: Dizziness.



[Series 2: head wo · axial · 0.44mm/px · z∈[-96,+24]mm · 7 of 32 slices shown, 9 images]
[im 4/32  brain]
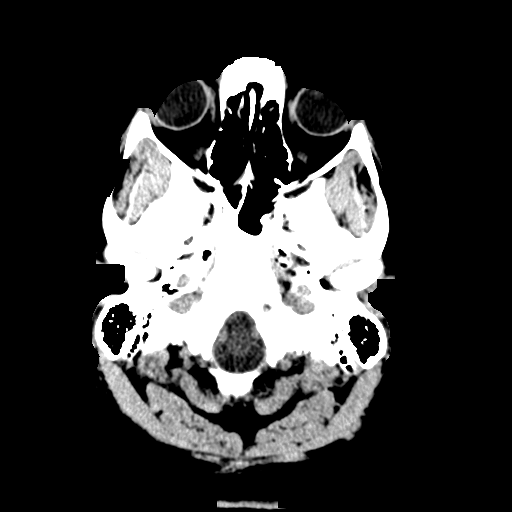
[im 4/32  bone]
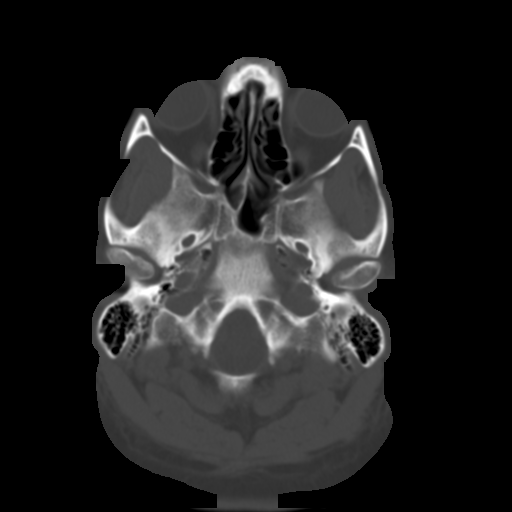
[im 8/32  brain]
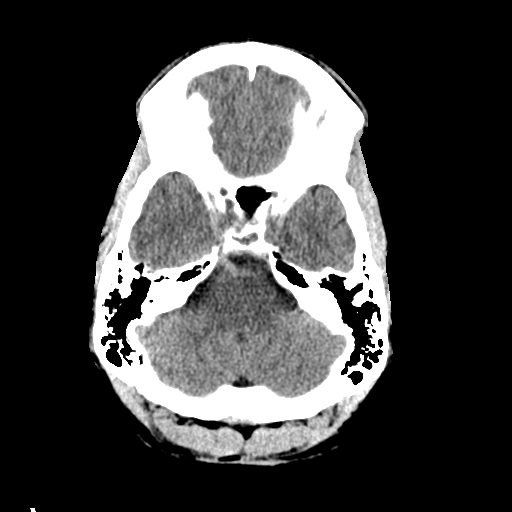
[im 12/32  brain]
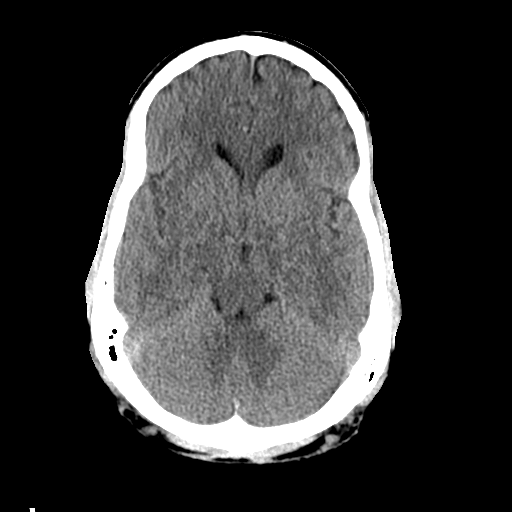
[im 16/32  brain]
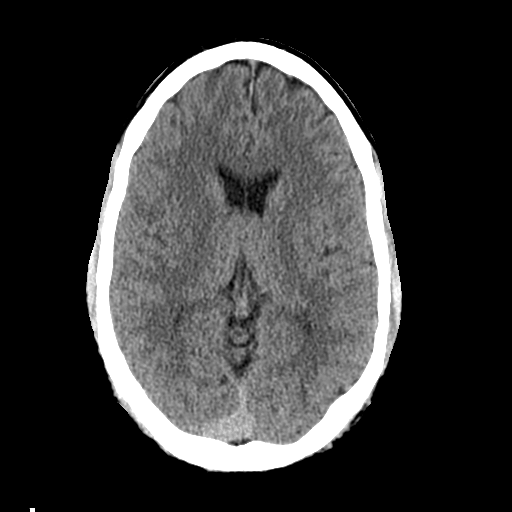
[im 20/32  brain]
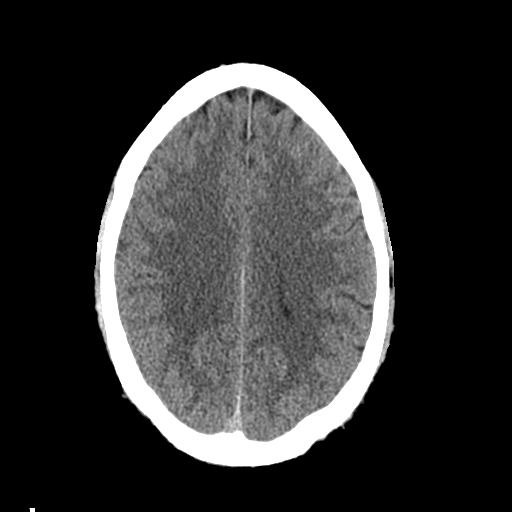
[im 20/32  bone]
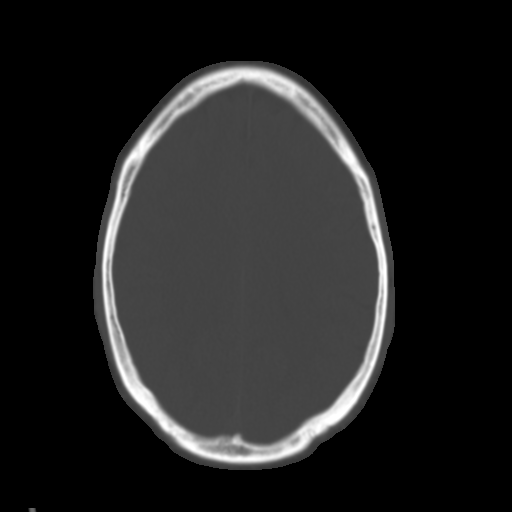
[im 24/32  brain]
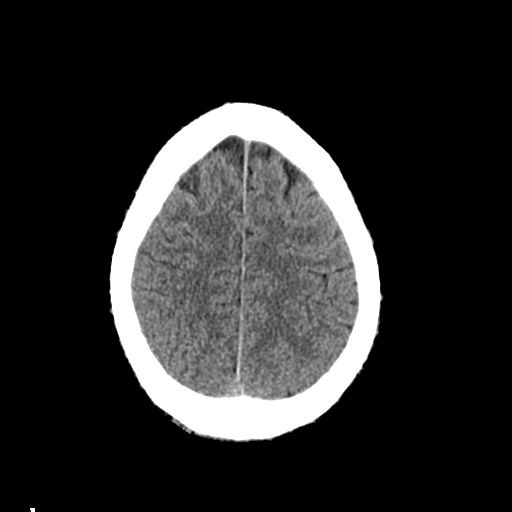
[im 28/32  brain]
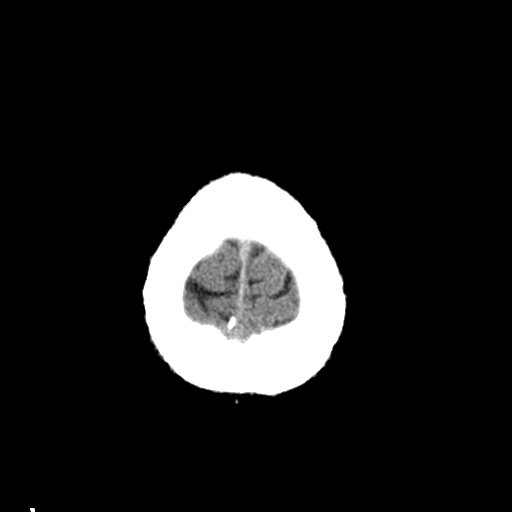

[Series 3: head bone · axial · 0.44mm/px · z∈[-97,-43]mm · 4 of 78 slices shown]
[im 8/78  bone]
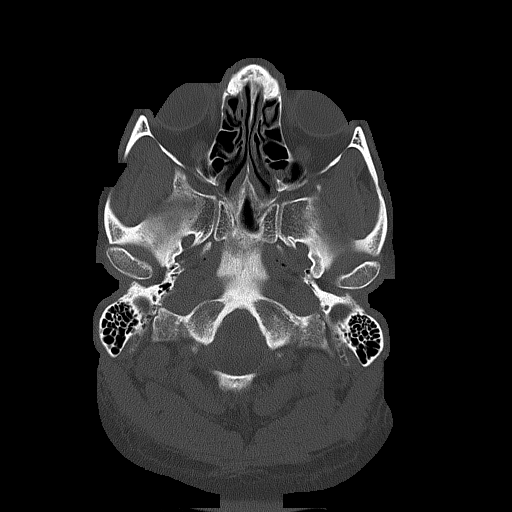
[im 16/78  bone]
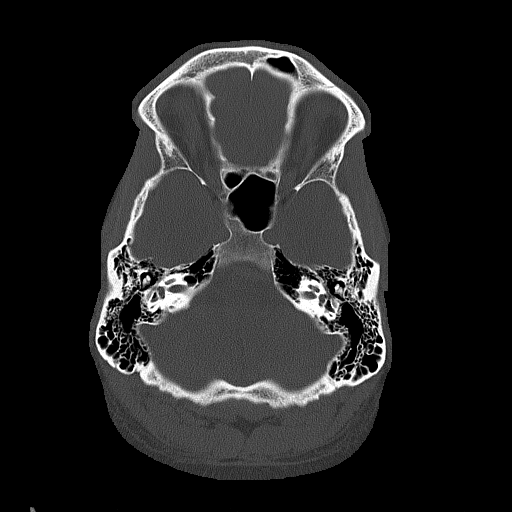
[im 24/78  bone]
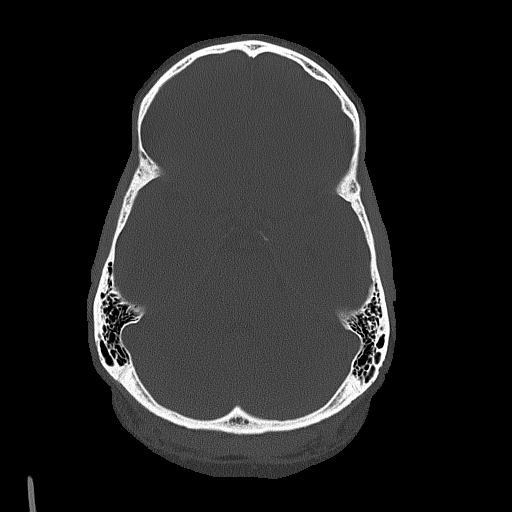
[im 35/78  bone]
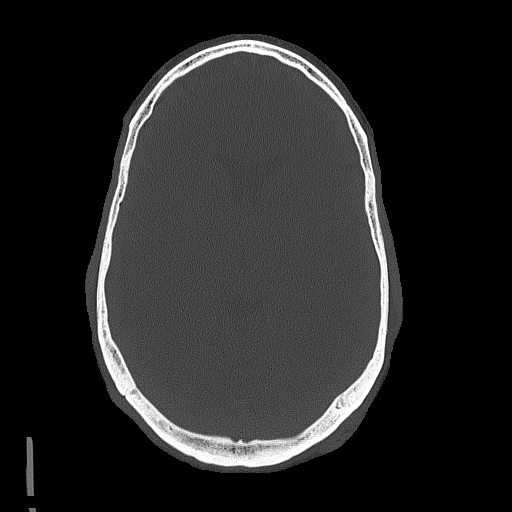

[Series 4: coronal soft tissue · coronal · 0.32mm/px · 3 of 69 slices shown]
[im 23/69  brain]
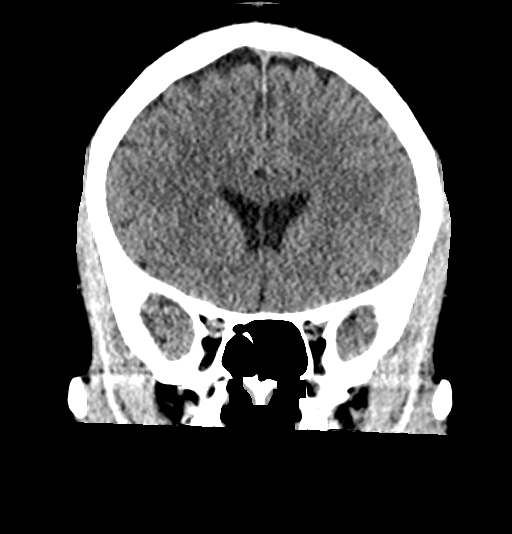
[im 31/69  brain]
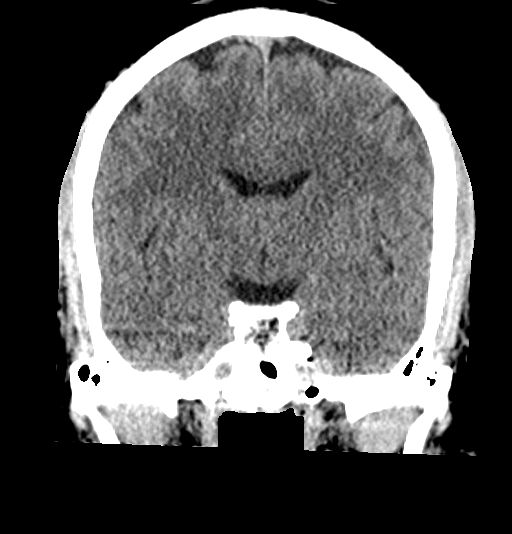
[im 38/69  brain]
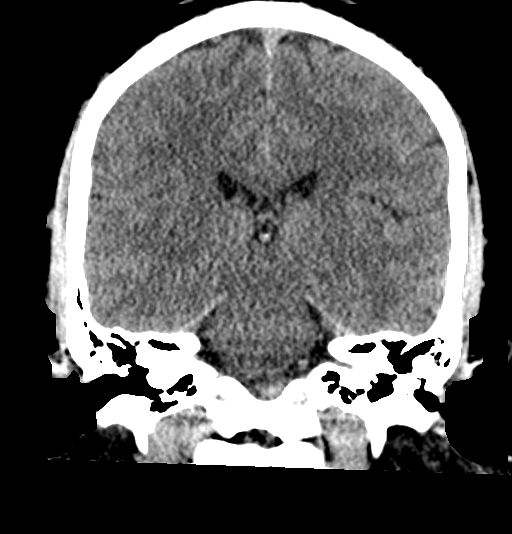

[Series 5: sagittal soft tissue · sagittal · 0.34mm/px · 3 of 54 slices shown]
[im 18/54  brain]
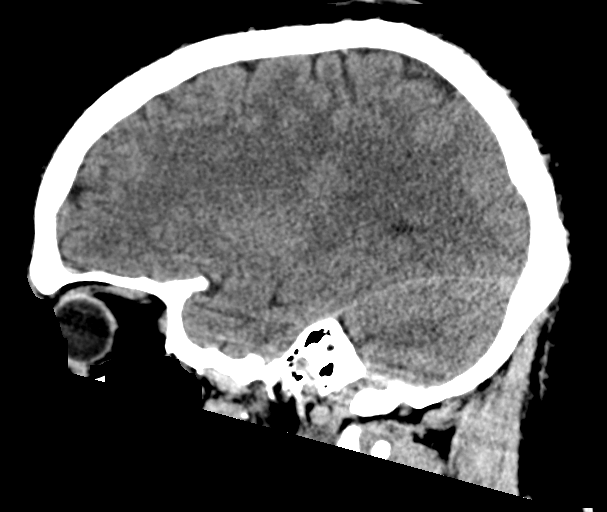
[im 27/54  brain]
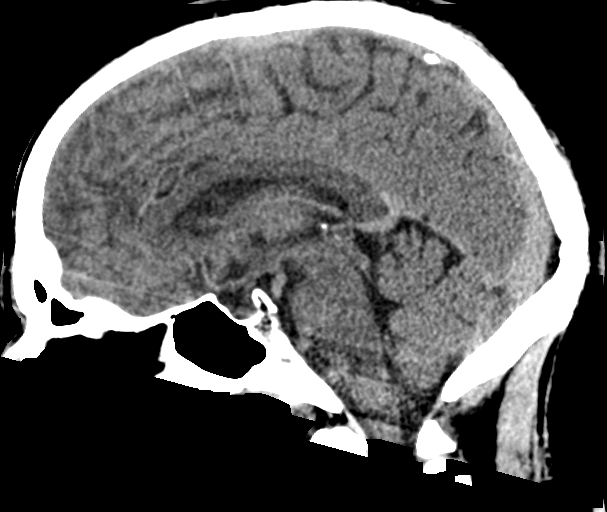
[im 36/54  brain]
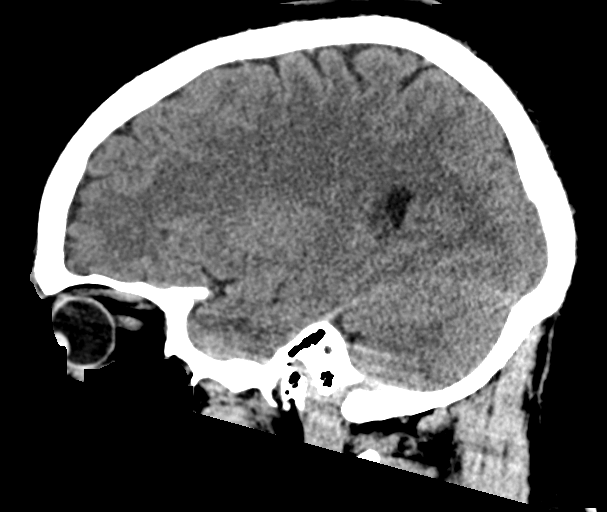

[17 of 47 positions shown; findings below may reference images not displayed]

FINDINGS: Brain: No evidence of acute infarction, hemorrhage, hydrocephalus,
extra-axial collection or mass lesion/mass effect.

Vascular: No hyperdense vessel or unexpected calcification.

Skull: Normal. Negative for fracture or focal lesion.

Sinuses/Orbits: No acute finding.

Other: None.
IMPRESSION: No acute intracranial abnormality.

## 2022-02-23 MED ORDER — SODIUM CHLORIDE 0.9% FLUSH
3.0000 mL | Freq: Once | INTRAVENOUS | Status: AC
  Administered 2022-02-24: 3 mL via INTRAVENOUS

## 2022-02-23 NOTE — ED Triage Notes (Signed)
Pt to ED via POV c/o dizziness and nausea that started 3 days ago. Pt states that he feels like he is drunk but he had not had anything to drink. Pt is A & O at this time and is in NAD.  ?

## 2022-02-23 NOTE — ED Provider Notes (Signed)
?[facility] ? ? ? ?CSN: [id number] ?Arrival date & time: 02/23/22  1754 ? ? ?  ? ?History   ?Chief Complaint ?Chief Complaint  ?Patient presents with  ? Dizziness  ? Nausea  ? ? ?HPI ?Gamble Kniceley is a 56 y.o. male.  ? ?Patient presents with intermittent dizziness and nausea without vomiting for 3 days, endorses that blurred vision began today.  Symptoms worsening.  Has been taking blood pressure at home and it has been elevated in the 160/110-120.denies history of hypertension or cardiac disease.  Daily smoker.  Denies chest pain, shortness of breath, abdominal pain.  Has not attempted treatment of symptoms. ? ?No past medical history on file. ? ?Patient Active Problem List  ? Diagnosis Date Noted  ? Left leg weakness 01/09/2021  ? Nicotine dependence 01/09/2021  ? ? ?Past Surgical History:  ?Procedure Laterality Date  ? SPINE SURGERY    ? ? ? ? ? ?Home Medications   ? ?Prior to Admission medications   ?Not on File  ? ? ?Family History ?Family History  ?Problem Relation Age of Onset  ? Hypertension Mother   ? Hypertension Father   ? ? ?Social History ?Social History  ? ?Tobacco Use  ? Smoking status: Every Day  ?  Types: Cigarettes  ? Smokeless tobacco: Never  ?Substance Use Topics  ? Alcohol use: Yes  ?  Comment: 1 pack every 2 weeks  ? Drug use: Never  ? ? ? ?Allergies   ?Morphine and related ? ? ?Review of Systems ?Review of Systems  ?Neurological:  Positive for dizziness.  ? ? ?Physical Exam ?Triage Vital Signs ?ED Triage Vitals  ?Enc Vitals Group  ?   BP 02/23/22 1805 (!) 185/174  ?   Pulse Rate 02/23/22 1805 76  ?   Resp 02/23/22 1805 16  ?   Temp 02/23/22 1805 98.6 ?F (37 ?C)  ?   Temp Source 02/23/22 1805 Oral  ?   SpO2 02/23/22 1805 97 %  ?   Weight --   ?   Height --   ?   Head Circumference --   ?   Peak Flow --   ?   Pain Score 02/23/22 1808 0  ?   Pain Loc --   ?   Pain Edu? --   ?   Excl. in [place]? --   ? ?No data found. ? ?Updated Vital Signs ?BP (!) 171/113 (BP Location: Left Arm)    Pulse 76   Temp 98.6 ?F (37 ?C) (Oral)   Resp 16   SpO2 97%  ? ?Visual Acuity ?Right Eye Distance:   ?Left Eye Distance:   ?Bilateral Distance:   ? ?Right Eye Near:   ?Left Eye Near:    ?Bilateral Near:    ? ?Physical Exam ? ? ?UC Treatments / Results  ?Labs ?(all labs ordered are listed, but only abnormal results are displayed) ?Labs Reviewed - No data to display ? ?EKG ? ? ?Radiology ?No results found. ? ?Procedures ?Procedures (including critical care time) ? ?Medications Ordered in UC ?Medications - No data to display ? ?Initial Impression / Assessment and Plan / UC Course  ?I have reviewed the triage vital signs and the nursing notes. ? ?Pertinent labs & imaging results that were available during my care of the patient were reviewed by me and considered in my medical decision making (see chart for details). ? ?Elevated blood pressure reading in office without diagnosis of hypertension ?Nausea ?Dizziness ?  Blurred vision ? ?Patient sent to the nearest emergency department for evaluation for hypertensive urgency, blood pressure in triage 171/113 which is the best reading after 2 attempts, currently symptomatic, to be escorted to the emergency department by spouse ?Final Clinical Impressions(s) / UC Diagnoses  ? ?Final diagnoses:  ?None  ? ?Discharge Instructions   ?None ?  ? ?ED Prescriptions   ?None ?  ? ?PDMP not reviewed this encounter. ?  ?Hans [place], NP ?02/23/22 1818 ? ?

## 2022-02-23 NOTE — ED Triage Notes (Signed)
Patient presents to Urgent Care with complaints of dizziness, nausea, and blurry vision x 3 days ago. States he reached out to pcp yesterday.  ? ?Denies chest pain, headache, numbness or tingling, or SOB.  ?

## 2022-02-24 ENCOUNTER — Emergency Department
Admission: EM | Admit: 2022-02-24 | Discharge: 2022-02-24 | Disposition: A | Discharge status: Home / Self Care | Payer: 59 | Attending: Emergency Medicine | Admitting: Emergency Medicine

## 2022-02-24 DIAGNOSIS — R42 Dizziness and giddiness: Secondary | ICD-10-CM

## 2022-02-24 MED ORDER — MECLIZINE HCL 25 MG PO TABS: 25.0000 mg | ORAL_TABLET | Freq: Three times a day (TID) | ORAL | 0 refills | Status: DC | PRN

## 2022-02-24 MED ORDER — MECLIZINE HCL 25 MG PO TABS
50.0000 mg | ORAL_TABLET | Freq: Once | ORAL | Status: AC
  Administered 2022-02-24: 50 mg via ORAL
  Filled 2022-02-24: qty 2

## 2022-02-24 MED ORDER — SODIUM CHLORIDE 0.9 % IV BOLUS (SEPSIS)
1000.0000 mL | Freq: Once | INTRAVENOUS | Status: AC
Start: 2022-02-24 — End: 2022-02-24
  Administered 2022-02-24: 1000 mL via INTRAVENOUS

## 2022-02-24 NOTE — ED Provider Notes (Signed)
? ?[facility] ?Provider Note ? ? ? Event Date/Time  ? First MD Initiated Contact with Patient 02/24/22 0110   ?  (approximate) ? ? ?History  ? ?Dizziness ? ? ?HPI ? ?Tyler Winters is a 56 y.o. male no significant past medical history who presents to the emergency department with complaints of intermittent vertigo for the past several days.  States episodes will come on all of a sudden and last for about 10 to 15 minutes and then resolved.  States at times he will become diaphoretic and nauseated.  No vomiting or diarrhea.  No fever.  No chest pain or shortness of breath.  No numbness, tingling or weakness.  No ear pain, tinnitus, hearing loss.  Has never had vertigo before.  States symptoms do not seem to change with position.  Asymptomatic currently.  Was seen at urgent care and sent here for further evaluation.  There was found to be hypertensive at 171/113 but currently blood pressure is 139/93.  No headache, vision changes currently. ? ? ?History provided by patient and wife. ? ? ? ?History reviewed. No pertinent past medical history. ? ?Past Surgical History:  ?Procedure Laterality Date  ? SPINE SURGERY    ? ? ?MEDICATIONS:  ?Prior to Admission medications   ?Not on File  ? ? ?Physical Exam  ? ?Triage Vital Signs: ?ED Triage Vitals  ?Enc Vitals Group  ?   BP 02/23/22 1901 (!) 157/103  ?   Pulse Rate 02/23/22 1901 63  ?   Resp 02/23/22 1901 18  ?   Temp 02/23/22 1901 98.5 ?F (36.9 ?C)  ?   Temp Source 02/23/22 1901 Oral  ?   SpO2 02/23/22 1901 97 %  ?   Weight --   ?   Height --   ?   Head Circumference --   ?   Peak Flow --   ?   Pain Score 02/23/22 1859 0  ?   Pain Loc --   ?   Pain Edu? --   ?   Excl. in GC? --   ? ? ?Most recent vital signs: ?Vitals:  ? 02/24/22 0130 02/24/22 0200  ?BP: (!) 133/95 (!) 141/95  ?Pulse: 78 (!) 57  ?Resp: (!) 27 (!) 24  ?Temp:    ?SpO2: 98% 98%  ? ? ?CONSTITUTIONAL: Alert and oriented and responds appropriately to questions. Well-appearing;  well-nourished ?HEAD: Normocephalic, atraumatic ?EYES: Conjunctivae clear, pupils appear equal, sclera nonicteric ?ENT: normal nose; moist mucous membranes; TMs are clear bilaterally without erythema, purulence, bulging, perforation, effusion.  No cerumen impaction or sign of foreign body in the external auditory canal. No inflammation, erythema or drainage from the external auditory canal. No signs of mastoiditis. No pain with manipulation of the pinna bilaterally. ?NECK: Supple, normal ROM ?CARD: RRR; S1 and S2 appreciated; no murmurs, no clicks, no rubs, no gallops ?RESP: Normal chest excursion without splinting or tachypnea; breath sounds clear and equal bilaterally; no wheezes, no rhonchi, no rales, no hypoxia or respiratory distress, speaking full sentences ?ABD/GI: Normal bowel sounds; non-distended; soft, non-tender, no rebound, no guarding, no peritoneal signs ?BACK: The back appears normal ?EXT: Normal ROM in all joints; no deformity noted, no edema; no cyanosis ?SKIN: Normal color for age and race; warm; no rash on exposed skin ?NEURO: Moves all extremities equally, normal speech, normal gait, normal sensation diffusely, cranial nerves II through XII intact, no drift ?PSYCH: The patient's mood and manner are appropriate. ? ? ?ED Results /  Procedures / Treatments  ? ?LABS: ?(all labs ordered are listed, but only abnormal results are displayed) ?Labs Reviewed  ?COMPREHENSIVE METABOLIC PANEL - Abnormal; Notable for the following components:  ?    Result Value  ? Calcium 8.8 (*)   ? All other components within normal limits  ?CBG MONITORING, ED - Abnormal; Notable for the following components:  ? Glucose-Capillary 128 (*)   ? All other components within normal limits  ?PROTIME-INR  ?APTT  ?CBC  ?DIFFERENTIAL  ?TROPONIN I (HIGH SENSITIVITY)  ?TROPONIN I (HIGH SENSITIVITY)  ? ? ? ?EKG: ? EKG Interpretation ? ?Date/Time:  Tuesday February 23 2022 19:00:25 EDT ?Ventricular Rate:  62 ?PR Interval:  174 ?QRS  Duration: 88 ?QT Interval:  376 ?QTC Calculation: 381 ?R Axis:   -21 ?Text Interpretation: Normal sinus rhythm with sinus arrhythmia Moderate voltage criteria for LVH, may be normal variant ( R in aVL , Cornell product ) Nonspecific T wave abnormality Abnormal ECG When compared with ECG of 08-Jan-2021 19:08, No significant change was found Confirmed by Rochele Raring [phone]) on 02/24/2022 1:33:44 AM ?  ? ?  ? ? ? ?RADIOLOGY: ?My personal review and interpretation of imaging: CT head shows no acute abnormality. ? ?I have personally reviewed all radiology reports.   ?CT HEAD WO CONTRAST ? ?Result Date: 02/23/2022 ?CLINICAL DATA:  Dizziness. EXAM: CT HEAD WITHOUT CONTRAST TECHNIQUE: Contiguous axial images were obtained from the base of the skull through the vertex without intravenous contrast. RADIATION DOSE REDUCTION: This exam was performed according to the departmental dose-optimization program which includes automated exposure control, adjustment of the mA and/or kV according to patient size and/or use of iterative reconstruction technique. COMPARISON:  CT examination dated December 09, 2020; MRI examination dated January 09, 2021 FINDINGS: Brain: No evidence of acute infarction, hemorrhage, hydrocephalus, extra-axial collection or mass lesion/mass effect. Vascular: No hyperdense vessel or unexpected calcification. Skull: Normal. Negative for fracture or focal lesion. Sinuses/Orbits: No acute finding. Other: None. IMPRESSION: No acute intracranial abnormality. Electronically Signed   By: Larose Hires D.O.   On: 02/23/2022 19:39   ? ? ?PROCEDURES: ? ?Critical Care performed: No ? ? ?CRITICAL CARE ?Performed by: Baxter Hire Annaliese Saez ? ? ?Total critical care time: 0 minutes ? ?Critical care time was exclusive of separately billable procedures and treating other patients. ? ?Critical care was necessary to treat or prevent imminent or life-threatening deterioration. ? ?Critical care was time spent personally by me on the following  activities: development of treatment plan with patient and/or surrogate as well as nursing, discussions with consultants, evaluation of patient's response to treatment, examination of patient, obtaining history from patient or surrogate, ordering and performing treatments and interventions, ordering and review of laboratory studies, ordering and review of radiographic studies, pulse oximetry and re-evaluation of patient's condition. ? ? ?.1-3 Lead EKG Interpretation ?Performed by: Tymeshia Awan, Layla Maw, DO ?Authorized by: Maijor Hornig, Layla Maw, DO  ? ?  Interpretation: normal   ?  ECG rate:  60 ?  ECG rate assessment: normal   ?  Rhythm: sinus rhythm   ?  Ectopy: none   ?  Conduction: normal   ? ? ? ?IMPRESSION / MDM / ASSESSMENT AND PLAN / ED COURSE  ?I reviewed the triage vital signs and the nursing notes. ? ? ? ?Patient here with intermittent vertigo.  Currently asymptomatic.  No other focal neurologic deficits. ? ?The patient is on the cardiac monitor to evaluate for evidence of arrhythmia and/or significant heart rate changes. ? ? ?  DIFFERENTIAL DIAGNOSIS (includes but not limited to):   Benign positional vertigo, less likely CVA, TIA, intracranial hemorrhage.  Differential also includes anemia, electrolyte derangement, dehydration.  Doubt ACS, PE or dissection. ? ? ?PLAN: We will obtain CBC, BMP, EKG, troponin, head CT.  Will give IV fluids, meclizine. ? ? ?MEDICATIONS GIVEN IN ED: ?Medications  ?sodium chloride flush (NS) 0.9 % injection 3 mL (3 mLs Intravenous Given 02/24/22 0144)  ?sodium chloride 0.9 % bolus 1,000 mL (1,000 mLs Intravenous New Bag/Given 02/24/22 0145)  ?meclizine (ANTIVERT) tablet 50 mg (50 mg Oral Given 02/24/22 0145)  ? ? ? ?ED COURSE: Labs today reassuring.  Normal hemoglobin, electrolytes, renal function.  Troponin x2 negative.  CT head reviewed by myself and radiologist and shows no acute abnormality.  He has been able to tolerate p.o. here and ambulate without difficulty. ? ?Patient reports feeling  better.  Able to ambulate and tolerate p.o.  Will discharge with prescription of meclizine.  Discussed return precautions.  Asymptomatic currently. ? ? ?At this time, I do not feel there is any life-threatening conditio

## 2022-02-24 NOTE — ED Notes (Addendum)
Pt ambulated around the room with stable oxygen and stable feet, denies blurry vision but some dizziness.  ?Pt given crackers and a water at this time ?

## 2022-09-07 DIAGNOSIS — F17200 Nicotine dependence, unspecified, uncomplicated: Secondary | ICD-10-CM | POA: Insufficient documentation

## 2023-05-28 ENCOUNTER — Emergency Department
Admission: EM | Admit: 2023-05-28 | Discharge: 2023-05-29 | Disposition: A | Discharge status: Home / Self Care | Payer: 59 | Attending: Emergency Medicine | Admitting: Emergency Medicine

## 2023-05-28 ENCOUNTER — Other Ambulatory Visit: Payer: Self-pay

## 2023-05-28 DIAGNOSIS — Q5569 Other congenital malformation of penis: Secondary | ICD-10-CM

## 2023-05-28 DIAGNOSIS — N4889 Other specified disorders of penis: Secondary | ICD-10-CM | POA: Diagnosis present

## 2023-05-28 LAB — URINALYSIS, ROUTINE W REFLEX MICROSCOPIC
Bacteria, UA: NONE SEEN
Bilirubin Urine: NEGATIVE
Glucose, UA: NEGATIVE mg/dL
Ketones, ur: NEGATIVE mg/dL
Leukocytes,Ua: NEGATIVE
Nitrite: NEGATIVE
Protein, ur: NEGATIVE mg/dL
Specific Gravity, Urine: 1.001 — ABNORMAL LOW (ref 1.005–1.030)
Squamous Epithelial / HPF: NONE SEEN /HPF (ref 0–5)
pH: 6 (ref 5.0–8.0)

## 2023-05-28 NOTE — ED Provider Notes (Signed)
[facility] Provider Note    Event Date/Time   First MD Initiated Contact with Patient 05/28/23 2327     (approximate)   History   SEXUALLY TRANSMITTED DISEASE (testing)   HPI  Tyler Winters is a 57 y.o. male no significant past medical history who presents to the emergency department with concern for lesion on his penis.  States that he had intercourse vaginally and anally with his wife.  Afterwards his wife noted a small area on his penis.  He believed that it was an abrasion from having sex.  She was concerned that it could be an STI.  Patient requesting STI testing.  Denies any dysuria or urinary urgency or frequency.  Denies any purulent drainage.  No other lesions.       Physical Exam   Triage Vital Signs: ED Triage Vitals  Enc Vitals Group     BP 05/28/23 2235 (!) 166/99     Pulse Rate 05/28/23 2235 86     Resp 05/28/23 2235 16     Temp 05/28/23 2235 98 F (36.7 C)     Temp Source 05/28/23 2235 Oral     SpO2 05/28/23 2235 99 %     Weight --      Height 05/28/23 2233 5\' 6"  (1.676 m)     Head Circumference --      Peak Flow --      Pain Score 05/28/23 2233 0     Pain Loc --      Pain Edu? --      Excl. in GC? --     Most recent vital signs: Vitals:   05/28/23 2235  BP: (!) 166/99  Pulse: 86  Resp: 16  Temp: 98 F (36.7 C)  SpO2: 99%    Physical Exam Constitutional:      Appearance: He is well-developed.  HENT:     Head: Atraumatic.  Eyes:     Conjunctiva/sclera: Conjunctivae normal.  Cardiovascular:     Rate and Rhythm: Regular rhythm.  Pulmonary:     Effort: No respiratory distress.  Genitourinary:    Comments: Chaperone present Small 1 mm flat area of erythema.  No obvious vesicles.  No pustules or purulent drainage.  No lymphadenopathy.  No other lesions to the head of the penis. Musculoskeletal:     Cervical back: Normal range of motion.  Skin:    General: Skin is warm.  Neurological:     Mental Status:  He is alert. Mental status is at baseline.      IMPRESSION / MDM / ASSESSMENT AND PLAN / ED COURSE  I reviewed the triage vital signs and the nursing notes.  Differential diagnosis including STI, urinary tract infection, abrasion.  No signs of vesicles, have a low suspicion for herpes, I do not see anything that is available for culture.   Labs (all labs ordered are listed, but only abnormal results are displayed) Labs interpreted as -    Labs Reviewed  URINALYSIS, ROUTINE W REFLEX MICROSCOPIC - Abnormal; Notable for the following components:      Result Value   Color, Urine COLORLESS (*)    APPearance CLEAR (*)    Specific Gravity, Urine 1.001 (*)    Hgb urine dipstick SMALL (*)    All other components within normal limits  CHLAMYDIA/NGC RT PCR ([facility] ONLY)              UA without signs of urinary tract infection.  GC and  chlamydia is currently in process  GC and Chlamydia were negative.  Patient does not want empiric treatment.  Discussed close follow-up with primary care physician.  Given information to follow-up with the health department.  Given return precautions for any worsening symptoms.  Discussed elevated blood pressure with the patient, does have significant stress today.  States that he will follow-up with a primary care physician for blood pressure recheck.     PROCEDURES:  Critical Care performed: No  Procedures  Patient's presentation is most consistent with acute illness / injury with system symptoms.   MEDICATIONS ORDERED IN ED: Medications - No data to display  FINAL CLINICAL IMPRESSION(S) / ED DIAGNOSES   Final diagnoses:  Penile anomaly     Rx / DC Orders   ED Discharge Orders     None        Note:  This document was prepared using Dragon voice recognition software and may include unintentional dictation errors.   Corena Herter, MD 05/29/23 [phone]

## 2023-05-28 NOTE — ED Triage Notes (Signed)
Pt to ed from home via POV for STD testing. Pt states "I think my wife is cheating on me. I have no symptoms. But I did have a bump show up on my penis". Pt is caox4, in no acute distress and ambulatory in triage.

## 2023-05-28 NOTE — ED Notes (Signed)
Pt unable to void at this time. Given specimen cup and water.

## 2023-05-29 LAB — CHLAMYDIA/NGC RT PCR (ARMC ONLY)
Chlamydia Tr: NOT DETECTED
N gonorrhoeae: NOT DETECTED

## 2023-05-29 NOTE — Discharge Instructions (Signed)
You are seen in the emergency department and your urine did not appear infected.  Your gonorrhea and Chlamydia test were negative.  Your blood pressure was mildly elevated in the emergency department, it is importantly follow-up closely with a primary care physician so they can recheck your blood pressure and make sure that you do not need to be treated for blood pressure issues.  Left untreated this can cause multiple problems including injuries to your heart and kidneys.  You are given information for primary care physicians in the area.  You are given information for the health department that you can follow-up as needed for any further STD testing.

## 2023-09-13 ENCOUNTER — Ambulatory Visit
Admission: EM | Admit: 2023-09-13 | Discharge: 2023-09-13 | Disposition: A | Discharge status: Home / Self Care | Payer: 59 | Attending: Physician Assistant | Admitting: Physician Assistant

## 2023-09-13 DIAGNOSIS — J029 Acute pharyngitis, unspecified: Secondary | ICD-10-CM | POA: Diagnosis not present

## 2023-09-13 DIAGNOSIS — R051 Acute cough: Secondary | ICD-10-CM | POA: Diagnosis present

## 2023-09-13 DIAGNOSIS — Z1152 Encounter for screening for COVID-19: Secondary | ICD-10-CM | POA: Insufficient documentation

## 2023-09-13 DIAGNOSIS — J069 Acute upper respiratory infection, unspecified: Secondary | ICD-10-CM | POA: Diagnosis not present

## 2023-09-13 LAB — SARS CORONAVIRUS 2 BY RT PCR: SARS Coronavirus 2 by RT PCR: NEGATIVE

## 2023-09-13 LAB — GROUP A STREP BY PCR: Group A Strep by PCR: NOT DETECTED

## 2023-09-13 NOTE — Discharge Instructions (Signed)
-  Negative strep -Retesting for COVID.  To call if its positive.  If positive you need to isolate until you have not a fever for 24 hours and your symptoms are improving.  Those the CDC guidelines.  Isolation tends to be about 5 days. - Increase rest and fluids.  May take over-the-counter medications as you have been taking.  Need to return for fever, weakness or breathing trouble.

## 2023-09-13 NOTE — ED Triage Notes (Signed)
Pt c/o sore throat x3 days. Has tried Theraflu & lozenges w/o relief.

## 2023-09-13 NOTE — ED Provider Notes (Signed)
[facility]    CSN: [id number] Arrival date & time: 09/13/23  1042      History   Chief Complaint Chief Complaint  Patient presents with   Sore Throat    HPI Tyler Winters is a 57 y.o. male presenting for 2-day history of sore throat and cough.  Denies fever, fatigue, body aches, headaches, nasal congestion/runny nose, chest pain, shortness of breath, nausea/vomiting or diarrhea.  Denies any sick contacts.  Has been taking TheraFlu and throat lozenges.  Sore throat has improved.  Needs a note to return to work.  HPI  History reviewed. No pertinent past medical history.  Patient Active Problem List   Diagnosis Date Noted   Left leg weakness 01/09/2021   Nicotine dependence 01/09/2021    Past Surgical History:  Procedure Laterality Date   SPINE SURGERY         Home Medications    Prior to Admission medications   Medication Sig Start Date End Date Taking? Authorizing Provider  amLODipine (NORVASC) 10 MG tablet Take 1 tablet by mouth daily. 04/27/22  Yes [provider]  hydrochlorothiazide (HYDRODIURIL) 12.5 MG tablet Take 1 tablet by mouth daily. 10/08/22  Yes [provider]  meclizine (ANTIVERT) 25 MG tablet Take 1 tablet (25 mg total) by mouth 3 (three) times daily as needed for dizziness. 02/24/22   Ward, Layla Maw, DO    Family History Family History  Problem Relation Age of Onset   Hypertension Mother    Hypertension Father     Social History Social History   Tobacco Use   Smoking status: Every Day    Types: Cigarettes   Smokeless tobacco: Never  Substance Use Topics   Alcohol use: Yes    Comment: 1 pack every 2 weeks   Drug use: Never     Allergies   Morphine and codeine and Morphine   Review of Systems Review of Systems  Constitutional:  Negative for fatigue and fever.  HENT:  Positive for sore throat. Negative for congestion, rhinorrhea, sinus pressure and sinus pain.   Respiratory:  Positive for  cough. Negative for shortness of breath.   Cardiovascular:  Negative for chest pain.  Gastrointestinal:  Negative for abdominal pain, diarrhea, nausea and vomiting.  Musculoskeletal:  Negative for myalgias.  Neurological:  Negative for weakness, light-headedness and headaches.  Hematological:  Negative for adenopathy.     Physical Exam Triage Vital Signs ED Triage Vitals  Encounter Vitals Group     BP 09/13/23 1110 (!) 158/104     Systolic BP Percentile --      Diastolic BP Percentile --      Pulse Rate 09/13/23 1110 68     Resp 09/13/23 1110 16     Temp 09/13/23 1110 98.4 F (36.9 C)     Temp Source 09/13/23 1110 Oral     SpO2 09/13/23 1110 95 %     Weight 09/13/23 1110 182 lb (82.6 kg)     Height 09/13/23 1110 5\' 6"  (1.676 m)     Head Circumference --      Peak Flow --      Pain Score 09/13/23 1114 0     Pain Loc --      Pain Education --      Exclude from Growth Chart --    No data found.  Updated Vital Signs BP (!) 158/104 (BP Location: Right Arm)   Pulse 68   Temp 98.4 F (36.9 C) (Oral)  Resp 16   Ht 5\' 6"  (1.676 m)   Wt 182 lb (82.6 kg)   SpO2 95%   BMI 29.38 kg/m    Physical Exam Vitals and nursing note reviewed.  Constitutional:      General: He is not in acute distress.    Appearance: He is not ill-appearing.  HENT:     Head: Normocephalic and atraumatic.     Nose: Nose normal.     Mouth/Throat:     Mouth: Mucous membranes are moist.     Pharynx: Oropharynx is clear. Posterior oropharyngeal erythema present.  Eyes:     General: No scleral icterus.    Conjunctiva/sclera: Conjunctivae normal.  Cardiovascular:     Rate and Rhythm: Normal rate and regular rhythm.     Heart sounds: Normal heart sounds.  Pulmonary:     Effort: Pulmonary effort is normal. No respiratory distress.     Breath sounds: Normal breath sounds.  Musculoskeletal:     Cervical back: Neck supple.  Skin:    General: Skin is warm and dry.     Capillary Refill: Capillary  refill takes less than 2 seconds.  Neurological:     General: No focal deficit present.     Mental Status: He is alert. Mental status is at baseline.     Motor: No weakness.     Gait: Gait normal.  Psychiatric:        Mood and Affect: Mood normal.        Behavior: Behavior normal.      UC Treatments / Results  Labs (all labs ordered are listed, but only abnormal results are displayed) Labs Reviewed  GROUP A STREP BY PCR  SARS CORONAVIRUS 2 BY RT PCR    EKG   Radiology No results found.  Procedures Procedures (including critical care time)  Medications Ordered in UC Medications - No data to display  Initial Impression / Assessment and Plan / UC Course  I have reviewed the triage vital signs and the nursing notes.  Pertinent labs & imaging results that were available during my care of the patient were reviewed by me and considered in my medical decision making (see chart for details).   57 year old male presents for 2-day history of sore throat and cough.  Denies fever or other symptoms.  Patient is overall well-appearing.  He is afebrile.  Mild posterior pharyngeal erythema, otherwise normal exam.  Strep negative.  COVID test.  Will call patient if positive.  Reviewed current CDC guidelines, isolation protocol and ED precautions are positive.  Viral URI.  Supportive care encouraged with continuing TheraFlu, throat lozenges, rest and fluids.  Reviewed return precautions.  Work note given.  Negative COVID test.   Final Clinical Impressions(s) / UC Diagnoses   Final diagnoses:  Viral upper respiratory tract infection  Sore throat  Acute cough     Discharge Instructions      -Negative strep -Retesting for COVID.  To call if its positive.  If positive you need to isolate until you have not a fever for 24 hours and your symptoms are improving.  Those the CDC guidelines.  Isolation tends to be about 5 days. - Increase rest and fluids.  May take  over-the-counter medications as you have been taking.  Need to return for fever, weakness or breathing trouble.     ED Prescriptions   None    PDMP not reviewed this encounter.   Shirlee Latch, PA-C 09/13/23 1244

## 2024-01-18 ENCOUNTER — Ambulatory Visit (INDEPENDENT_AMBULATORY_CARE_PROVIDER_SITE_OTHER): Payer: Medicaid Other | Admitting: Family Medicine

## 2024-01-18 ENCOUNTER — Encounter: Payer: Self-pay | Admitting: Family Medicine

## 2024-01-18 VITALS — BP 146/94 | HR 63 | Temp 97.6°F | Resp 16 | Ht 66.0 in | Wt 193.8 lb

## 2024-01-18 DIAGNOSIS — Z1211 Encounter for screening for malignant neoplasm of colon: Secondary | ICD-10-CM

## 2024-01-18 DIAGNOSIS — M5416 Radiculopathy, lumbar region: Secondary | ICD-10-CM | POA: Insufficient documentation

## 2024-01-18 DIAGNOSIS — F419 Anxiety disorder, unspecified: Secondary | ICD-10-CM

## 2024-01-18 DIAGNOSIS — F32A Depression, unspecified: Secondary | ICD-10-CM | POA: Insufficient documentation

## 2024-01-18 DIAGNOSIS — I10 Essential (primary) hypertension: Secondary | ICD-10-CM | POA: Insufficient documentation

## 2024-01-18 DIAGNOSIS — R29898 Other symptoms and signs involving the musculoskeletal system: Secondary | ICD-10-CM

## 2024-01-18 MED ORDER — CLONIDINE HCL 0.1 MG PO TABS
0.1000 mg | ORAL_TABLET | Freq: Once | ORAL | Status: AC
  Administered 2024-01-18: 0.1 mg via ORAL

## 2024-01-18 MED ORDER — HYDROCHLOROTHIAZIDE 12.5 MG PO TABS: 12.5000 mg | ORAL_TABLET | Freq: Every day | ORAL | 3 refills | Status: DC

## 2024-01-18 MED ORDER — AMLODIPINE BESYLATE 10 MG PO TABS: 10.0000 mg | ORAL_TABLET | Freq: Every day | ORAL | 11 refills | Status: DC

## 2024-01-18 NOTE — Progress Notes (Unsigned)
 New Patient Office Visit  Subjective    Patient ID: Tyler Winters, male    DOB: 02-Jan-1966  Age: 58 y.o. MRN: [id number]  CC:  Chief Complaint  Patient presents with  . 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  He will lose his balance and fall.  He falls because his left leg feels weak and he falls almost every day.  He has noticed while washing dishes that he will have lumbar back pain.  He does report weakness in his leg leg but not the hip.       He is supposed to be taking blood pressure HCTZ 12.5 and amlodipine 10 mg but he has not taken that recently.  He has no chest pain, shortness of breath, headache, palpitations, peripheral edema, PND or orthopnea. He has eaten this morning. PHQ-9 is 12, GAD-7 is 14.  He does not want to speak to a psychologist.  He does not want to take medication for anxiety or depression.  His anxiety and depression are  more situational.  Now he cannot function at the level he used to function.  He has not been able to get a job because as soon as he tells him that he has falls they are not interested.   Outpatient Encounter Medications as of 01/18/2024  Medication Sig  . amLODipine (NORVASC) 10 MG tablet Take 1 tablet (10 mg total) by mouth daily.  . hydrochlorothiazide (HYDRODIURIL) 12.5 MG tablet Take 1 tablet (12.5 mg total) by mouth daily.  . meclizine (ANTIVERT) 25 MG tablet Take 1 tablet (25 mg total) by mouth 3 (three) times daily as needed for dizziness. (Patient not taking: Reported on 01/18/2024)  . pregabalin (LYRICA) 25 MG capsule Take by mouth. (Patient not taking: Reported on 01/18/2024)  . sildenafil (REVATIO) 20 MG tablet TAKE UP TO 3 TABLETS BY MOUTH ONCE A DAY AS NEEDED 30 MINUTESPRIOR TO  SEXUAL ACTIVITY (Patient not taking: Reported on 01/18/2024)  . [DISCONTINUED] amLODipine (NORVASC) 10 MG tablet Take 1 tablet by mouth daily. (Patient not taking: Reported on 01/18/2024)  . [DISCONTINUED] hydrochlorothiazide (HYDRODIURIL) 12.5 MG tablet Take 1 tablet by mouth daily. (Patient not taking: Reported on 01/18/2024)  . [DISCONTINUED] meclizine (ANTIVERT) 25 MG tablet Take by mouth. (Patient not taking: Reported on 01/18/2024)  . [EXPIRED] cloNIDine (CATAPRES) tablet 0.1 mg    No facility-administered encounter medications on file as of 01/18/2024.    History reviewed. No pertinent past medical history.  Past Surgical History:  Procedure Laterality Date  . SPINE SURGERY      Family History  Problem Relation Age of Onset  . Hypertension Mother   . Hypertension Father     Social History   Socioeconomic History  . Marital status: Married    Spouse name: Not on file  . Number of children: Not on file  . Years of education: Not on file  . Highest education level: 12th grade  Occupational History  . Not on file  Tobacco Use  . Smoking status: Every Day    Types: Cigarettes  . Smokeless tobacco: Never  Substance and Sexual Activity  . Alcohol use: Yes    Comment: 1 pack every 2 weeks  . Drug use: Never  . Sexual activity: Yes  Other Topics Concern  .  Not on file  Social History Narrative  . Not on file   Social Drivers of Health   Financial Resource Strain: High Risk (01/17/2024)   Overall Financial Resource Strain (CARDIA)   . Difficulty of Paying Living Expenses: Very hard  Food Insecurity: Food Insecurity Present (01/17/2024)   Hunger Vital Sign   . Worried About Programme researcher, broadcasting/film/video in the Last Year: Often true   . Ran Out of Food in the Last Year: Often true  Transportation Needs: Unmet Transportation Needs (01/17/2024)   PRAPARE - Transportation   . Lack of Transportation (Medical): Yes   . Lack of Transportation (Non-Medical): Yes  Physical Activity:  Unknown (01/17/2024)   Exercise Vital Sign   . Days of Exercise per Week: 0 days   . Minutes of Exercise per Session: Not on file  Stress: Stress Concern Present (01/17/2024)   [employer] of Occupational Health - Occupational Stress Questionnaire   . Feeling of Stress : Very much  Social Connections: Socially Isolated (01/17/2024)   Social Connection and Isolation Panel [NHANES]   . Frequency of Communication with Friends and Family: Once a week   . Frequency of Social Gatherings with Friends and Family: Never   . Attends Religious Services: Never   . Active Member of Clubs or Organizations: No   . Attends Banker Meetings: Not on file   . Marital Status: Married  Catering manager Violence: Not At Risk (01/18/2024)   Humiliation, Afraid, Rape, and Kick questionnaire   . Fear of Current or Ex-Partner: No   . Emotionally Abused: No   . Physically Abused: No   . Sexually Abused: No    ROS      Objective    BP (!) 146/94 (BP Location: Left Arm, Patient Position: Sitting, Cuff Size: Normal)   Pulse 63   Temp 97.6 F (36.4 C) (Oral)   Resp 16   Ht 5\' 6"  (1.676 m)   Wt 193 lb 12.8 oz (87.9 kg)   SpO2 95%   BMI 31.28 kg/m   Physical Exam Vitals and nursing note reviewed.  Constitutional:      Appearance: Normal appearance.     Comments: Walks with a limp.  HENT:     Head: Normocephalic and atraumatic.  Eyes:     Conjunctiva/sclera: Conjunctivae normal.  Cardiovascular:     Rate and Rhythm: Normal rate and regular rhythm.  Pulmonary:     Effort: Pulmonary effort is normal.     Breath sounds: Normal breath sounds.  Musculoskeletal:     Right lower leg: No edema.     Left lower leg: No edema.  Skin:    General: Skin is warm and dry.  Neurological:     Mental Status: He is alert and oriented to person, place, and time.     Gait: Gait abnormal.     Deep Tendon Reflexes: Reflexes are normal and symmetric.  Psychiatric:        Mood and Affect: Mood  normal.        Behavior: Behavior normal.        Thought Content: Thought content normal.        Judgment: Judgment normal.    {Labs (Optional):23779}    Assessment & Plan:   Problem List Items Addressed This Visit       Cardiovascular and Mediastinum   Hypertension   Is not taking his BP medication because he does not feel bad.  Wrote for his medications amlodipine 10  mg and hydrochlorothiazide 12.5mg .  Come back in a week and recheck BP.. Will check his labs when he returns because he has eaten today.   BP 175/105 that we gave him a clonidine 0.1mg  in office.  Recheck after 20 minutes and his BP 146/94.        Relevant Medications   sildenafil (REVATIO) 20 MG tablet   hydrochlorothiazide (HYDRODIURIL) 12.5 MG tablet   amLODipine (NORVASC) 10 MG tablet   Other Relevant Orders   CBC with Differential   CMP14+EGFR   Lipid panel   TSH + free T4     Nervous and Auditory   Left leg weakness   No appreciable weakness in left leg or foot.  DTRS are normal and intact.      Lumbar radiculopathy, chronic   Has lumbar pain when he leans forward like washing the dishes.  Positive straight leg lift ipsilateral.  Would like to referral to physical therapy but has [place] [facility].  [facility]. Checking lumbar films.      Relevant Medications   pregabalin (LYRICA) 25 MG capsule   Other Relevant Orders   Ambulatory referral to Spine Surgery   DG Lumbar Spine 2-3 Views     Other   Anxiety and depression   Offered referral to psychologist but he declined.  Occasionally has SI but no plan.  Very frustrated because he cannot do the things he used to do.  Frustrated because he looses his balance and falls.        Other Visit Diagnoses       Screening for colon cancer    -  Primary   Relevant Orders   Ambulatory referral to Gastroenterology       Return in about 3 months (around 04/16/2024) for 1 week only BP check with nurse//will be an OV if BP is not within  range.   Alease Medina, MD

## 2024-01-18 NOTE — Assessment & Plan Note (Addendum)
 Is not taking his BP medication because he does not feel bad.  Wrote for his medications amlodipine 10 mg and hydrochlorothiazide 12.5mg .  Come back in a week and recheck BP.. Will check his labs when he returns because he has eaten today.   BP 175/105 that we gave him a clonidine 0.1mg  in office.  Recheck after 20 minutes and his BP 146/94.

## 2024-01-18 NOTE — Assessment & Plan Note (Addendum)
 No appreciable weakness in left leg or foot.  DTRS are normal and intact.

## 2024-01-18 NOTE — Assessment & Plan Note (Addendum)
 Offered referral to psychologist but he declined.  Occasionally has SI but no plan.  Very frustrated because he cannot do the things he used to do.  Frustrated because he looses his balance and falls.

## 2024-01-18 NOTE — Assessment & Plan Note (Addendum)
 Has lumbar pain when he leans forward like washing the dishes.  Positive straight leg lift ipsilateral.  Would like to referral to physical therapy but has [place] [facility].  [facility]. Checking lumbar films.

## 2024-01-25 ENCOUNTER — Ambulatory Visit (INDEPENDENT_AMBULATORY_CARE_PROVIDER_SITE_OTHER): Payer: Medicaid Other

## 2024-01-25 VITALS — BP 141/93 | HR 74

## 2024-01-25 DIAGNOSIS — I10 Essential (primary) hypertension: Secondary | ICD-10-CM

## 2024-01-25 NOTE — Progress Notes (Signed)
 Pt denies CP, SOB, dizziness, or heart palpitations. taking meds as directed without problems. Denies med side effects. 5 min spent with pt.

## 2024-01-26 ENCOUNTER — Encounter: Payer: Self-pay | Admitting: *Deleted

## 2024-01-28 LAB — CBC WITH DIFFERENTIAL/PLATELET
Basophils Absolute: 0 10*3/uL (ref 0.0–0.2)
Basos: 1 %
EOS (ABSOLUTE): 0.1 10*3/uL (ref 0.0–0.4)
Eos: 3 %
Hematocrit: 46.5 % (ref 37.5–51.0)
Hemoglobin: 15.3 g/dL (ref 13.0–17.7)
Immature Grans (Abs): 0 10*3/uL (ref 0.0–0.1)
Immature Granulocytes: 0 %
Lymphocytes Absolute: 2 10*3/uL (ref 0.7–3.1)
Lymphs: 37 %
MCH: 30.4 pg (ref 26.6–33.0)
MCHC: 32.9 g/dL (ref 31.5–35.7)
MCV: 92 fL (ref 79–97)
Monocytes Absolute: 0.4 10*3/uL (ref 0.1–0.9)
Monocytes: 8 %
Neutrophils Absolute: 2.8 10*3/uL (ref 1.4–7.0)
Neutrophils: 51 %
Platelets: 267 10*3/uL (ref 150–450)
RBC: 5.03 x10E6/uL (ref 4.14–5.80)
RDW: 13.2 % (ref 11.6–15.4)
WBC: 5.4 10*3/uL (ref 3.4–10.8)

## 2024-01-28 LAB — CMP14+EGFR
ALT: 23 IU/L (ref 0–44)
AST: 21 IU/L (ref 0–40)
Albumin: 4.5 g/dL (ref 3.8–4.9)
Alkaline Phosphatase: 95 IU/L (ref 44–121)
BUN/Creatinine Ratio: 14 (ref 9–20)
BUN: 12 mg/dL (ref 6–24)
Bilirubin Total: 0.4 mg/dL (ref 0.0–1.2)
CO2: 25 mmol/L (ref 20–29)
Calcium: 9.6 mg/dL (ref 8.7–10.2)
Chloride: 104 mmol/L (ref 96–106)
Creatinine, Ser: 0.87 mg/dL (ref 0.76–1.27)
Globulin, Total: 3.1 g/dL (ref 1.5–4.5)
Glucose: 87 mg/dL (ref 70–99)
Potassium: 4.2 mmol/L (ref 3.5–5.2)
Sodium: 142 mmol/L (ref 134–144)
Total Protein: 7.6 g/dL (ref 6.0–8.5)
eGFR: 101 mL/min/{1.73_m2} (ref >59)

## 2024-01-28 LAB — LIPID PANEL
Chol/HDL Ratio: 5.1 ratio — ABNORMAL HIGH (ref 0.0–5.0)
Cholesterol, Total: 221 mg/dL — ABNORMAL HIGH (ref 100–199)
HDL: 43 mg/dL (ref >39)
LDL Chol Calc (NIH): 150 mg/dL — ABNORMAL HIGH (ref 0–99)
Triglycerides: 156 mg/dL — ABNORMAL HIGH (ref 0–149)
VLDL Cholesterol Cal: 28 mg/dL (ref 5–40)

## 2024-01-28 LAB — TSH+FREE T4
Free T4: 1.36 ng/dL (ref 0.82–1.77)
TSH: 0.675 u[IU]/mL (ref 0.450–4.500)

## 2024-01-30 ENCOUNTER — Encounter: Payer: Self-pay | Admitting: Family Medicine

## 2024-01-30 DIAGNOSIS — E782 Mixed hyperlipidemia: Secondary | ICD-10-CM

## 2024-01-31 MED ORDER — ROSUVASTATIN CALCIUM 10 MG PO TABS: 10.0000 mg | ORAL_TABLET | Freq: Every day | ORAL | 3 refills | Status: DC

## 2024-03-26 ENCOUNTER — Ambulatory Visit: Admitting: Family Medicine

## 2024-03-27 ENCOUNTER — Encounter: Payer: Self-pay | Admitting: Family Medicine

## 2024-03-27 ENCOUNTER — Ambulatory Visit (INDEPENDENT_AMBULATORY_CARE_PROVIDER_SITE_OTHER): Admitting: Family Medicine

## 2024-03-27 VITALS — BP 132/85 | HR 69 | Temp 97.8°F | Resp 18 | Ht 66.0 in | Wt 202.0 lb

## 2024-03-27 DIAGNOSIS — I10 Essential (primary) hypertension: Secondary | ICD-10-CM

## 2024-03-27 DIAGNOSIS — M5416 Radiculopathy, lumbar region: Secondary | ICD-10-CM

## 2024-03-27 DIAGNOSIS — F419 Anxiety disorder, unspecified: Secondary | ICD-10-CM | POA: Diagnosis not present

## 2024-03-27 DIAGNOSIS — F32A Depression, unspecified: Secondary | ICD-10-CM

## 2024-03-27 DIAGNOSIS — E782 Mixed hyperlipidemia: Secondary | ICD-10-CM | POA: Insufficient documentation

## 2024-03-27 DIAGNOSIS — Z0000 Encounter for general adult medical examination without abnormal findings: Secondary | ICD-10-CM | POA: Insufficient documentation

## 2024-03-27 MED ORDER — HYDROCHLOROTHIAZIDE 12.5 MG PO TABS: 12.5000 mg | ORAL_TABLET | Freq: Every day | ORAL | 11 refills | Status: DC

## 2024-03-27 MED ORDER — AMLODIPINE BESYLATE 10 MG PO TABS: 10.0000 mg | ORAL_TABLET | Freq: Every day | ORAL | 11 refills | Status: DC

## 2024-03-27 MED ORDER — ROSUVASTATIN CALCIUM 10 MG PO TABS: 10.0000 mg | ORAL_TABLET | Freq: Every day | ORAL | 3 refills | Status: DC

## 2024-03-27 NOTE — Assessment & Plan Note (Addendum)
 Ask him to go to physical therapy and get exercises for lumbar radiculopathy.  He is no longer taking Lyrica.  Reports he can sit down and the pain in his back will abate in a few minutes.

## 2024-03-27 NOTE — Assessment & Plan Note (Signed)
 11/03/2021 negative Cologuard.

## 2024-03-27 NOTE — Assessment & Plan Note (Signed)
 On amlodipine  10 mg daily and HCTZ 12.5 mg daily.  Good control of his blood pressure today at 132/85.  Checks his blood pressure at home but is getting numbers in the 140s to 160s over 80s to 90s.  Suspect he is not waiting long enough to take his blood pressure after he sits down.

## 2024-03-27 NOTE — Assessment & Plan Note (Signed)
 On Crestor  10 mg daily and tolerating well.  Prior to starting Crestor  10mg :  total cholesterol 221, Triggs 156, HDL 43 and LDL 150.  Goal is ASCVD 10-year risk 7.5% or less.

## 2024-03-27 NOTE — Assessment & Plan Note (Signed)
 PHQ-9 is 16 and GAD-7 is 13.  Does not want to go to psychotherapy and does not want to take medicine for anxiety or depression.  He states that he is doing just fine.  Denies suicidal and homicidal ideation.  Affect is appropriate.  Feels his anxiety and depression is situational because he is not working.

## 2024-03-27 NOTE — Progress Notes (Signed)
 Established Patient Office Visit  Subjective   Patient ID: Tyler Winters, male    DOB: 10-16-1966  Age: 58 y.o. MRN: [id number]  Chief Complaint  Patient presents with   Medical Management of Chronic Issues    HPI Delightful 58 year old male with a HX of DDD of the cervical spine (C4-C7, s/p allograft spine surgery), chronic myelopathy left leg, patient reports started after surgery 06/2022).  Wife is with him today.   Still has weakness in his left leg, loses his balance and falls.  Denies hurting himself when he falls.  He is walking with a cane now which helps.  Has lumbar radiculopathy when he leans forward for any sustained period of time.  For example  when washing dishes or loading beer in the cooler. [facility] not get [facility].  Reports he does not answer the phone if he does not recognize the number.   Is taking his HCTZ 12.5 and amlodipine  10 mg daily.  Denies any chest pains, shortness of breath with ambulation, headache, palpitations, peripheral edema, PND or orthopnea.  Has been checking his blood pressure at home and brings in his record blood pressures range from the 140s to the low 160s over 90s.  Did not bring his cuff with him today. He did start Crestor  10 for his cholesterol. 01/27/2024 total cholesterol 221, Triggs 156, HDL 43 and LDL 150.  Reports no difficulty taking Crestor  10 mg. Specifically denies any muscle pain.    Did not hear from his GI referral.  Thinks he had a Cologuard about 3 years ago.  Called Cologuard, negative Cologuard 11/03/2021.  PHQ-9 is 16 and GAD-7 is 13.  Is not interested in psychotherapy.  Does not want to take medications for anxiety or depression.  Feels like he is doing fine.  Denies suicidal and homicidal ideation.  Reports his problem is that he cannot get a job and is having to go on disability.  He is also very frustrated because he has frequent falls when he walks. He denies hitting his head and denies neck pain.  He "tucks  and rolls' when he falls and does not get hurt.   He is drinking 3 beers a day and drinks most every day.  Sometimes he has a shot of whiskey or 2 instead of beer.    ROS    Objective:     BP 132/85 (BP Location: Right Arm, Patient Position: Sitting, Cuff Size: Normal)   Pulse 69   Temp 97.8 F (36.6 C) (Oral)   Resp 18   Ht 5\' 6"  (1.676 m)   Wt 202 lb (91.6 kg)   SpO2 95%   BMI 32.60 kg/m    Physical Exam Vitals and nursing note reviewed.  Constitutional:      Appearance: Normal appearance.  HENT:     Head: Normocephalic and atraumatic.  Eyes:     Conjunctiva/sclera: Conjunctivae normal.  Cardiovascular:     Rate and Rhythm: Normal rate and regular rhythm.  Pulmonary:     Effort: Pulmonary effort is normal.     Breath sounds: Normal breath sounds.  Musculoskeletal:     Right lower leg: No edema.     Left lower leg: No edema.  Skin:    General: Skin is warm and dry.  Neurological:     Mental Status: He is alert and oriented to person, place, and time.     Comments: Positive leg lift left at 70 degrees.  Negative contralateral.  Psychiatric:        Mood and Affect: Mood normal.        Behavior: Behavior normal.        Thought Content: Thought content normal.        Judgment: Judgment normal.          Results for orders placed or performed in visit on 03/27/24  Cologuard  Result Value Ref Range   Cologuard Negative Negative      The 10-year ASCVD risk score (Arnett DK, et al., 2019) is: 21.4%    Assessment & Plan:  Chronic left-sided lumbar radiculopathy -     Ambulatory referral to Physical Therapy  Primary hypertension Assessment & Plan: On amlodipine  10 mg daily and HCTZ 12.5 mg daily.  Good control of his blood pressure today at 132/85.  Checks his blood pressure at home but is getting numbers in the 140s to 160s over 80s to 90s.  Suspect he is not waiting long enough to take his blood pressure after he sits down.  Orders: -      amLODIPine  Besylate; Take 1 tablet (10 mg total) by mouth daily.  Dispense: 30 tablet; Refill: 11 -     hydroCHLOROthiazide ; Take 1 tablet (12.5 mg total) by mouth daily.  Dispense: 30 tablet; Refill: 11 -     Comprehensive metabolic panel with GFR  Mixed hyperlipidemia -     Rosuvastatin  Calcium ; Take 1 tablet (10 mg total) by mouth daily.  Dispense: 90 tablet; Refill: 3 -     Lipid panel  Anxiety and depression Assessment & Plan: PHQ-9 is 16 and GAD-7 is 13.  Does not want to go to psychotherapy and does not want to take medicine for anxiety or depression.  He states that he is doing just fine.  Denies suicidal and homicidal ideation.  Affect is appropriate.  Feels his anxiety and depression is situational because he is not working.   Lumbar radiculopathy, chronic Assessment & Plan: Ask him to go to physical therapy and get exercises for lumbar radiculopathy.  He is no longer taking Lyrica.  Reports he can sit down and the pain in his back will abate in a few minutes.   Hyperlipidemia, mixed Assessment & Plan: On Crestor  10 mg daily and tolerating well.  Prior to starting Crestor  10mg :  total cholesterol 221, Triggs 156, HDL 43 and LDL 150.  Goal is ASCVD 10-year risk 7.5% or less.   Healthcare maintenance Assessment & Plan: 11/03/2021 negative Cologuard.      Return in about 6 weeks (around 05/08/2024).    Edinson Domeier K Marvyn Torrez, MD

## 2024-03-28 ENCOUNTER — Encounter: Payer: Self-pay | Admitting: Family Medicine

## 2024-03-28 LAB — COMPREHENSIVE METABOLIC PANEL WITH GFR
ALT: 44 IU/L (ref 0–44)
AST: 24 IU/L (ref 0–40)
Albumin: 4.2 g/dL (ref 3.8–4.9)
Alkaline Phosphatase: 87 IU/L (ref 44–121)
BUN/Creatinine Ratio: 14 (ref 9–20)
BUN: 13 mg/dL (ref 6–24)
Bilirubin Total: 0.4 mg/dL (ref 0.0–1.2)
CO2: 23 mmol/L (ref 20–29)
Calcium: 9.4 mg/dL (ref 8.7–10.2)
Chloride: 104 mmol/L (ref 96–106)
Creatinine, Ser: 0.95 mg/dL (ref 0.76–1.27)
Globulin, Total: 2.7 g/dL (ref 1.5–4.5)
Glucose: 89 mg/dL (ref 70–99)
Potassium: 4.3 mmol/L (ref 3.5–5.2)
Sodium: 142 mmol/L (ref 134–144)
Total Protein: 6.9 g/dL (ref 6.0–8.5)
eGFR: 94 mL/min/{1.73_m2} (ref >59)

## 2024-03-28 LAB — LIPID PANEL
Chol/HDL Ratio: 2.8 ratio (ref 0.0–5.0)
Cholesterol, Total: 137 mg/dL (ref 100–199)
HDL: 49 mg/dL (ref >39)
LDL Chol Calc (NIH): 69 mg/dL (ref 0–99)
Triglycerides: 104 mg/dL (ref 0–149)
VLDL Cholesterol Cal: 19 mg/dL (ref 5–40)

## 2024-04-10 ENCOUNTER — Telehealth: Payer: Self-pay | Admitting: Family Medicine

## 2024-04-10 DIAGNOSIS — Z0279 Encounter for issue of other medical certificate: Secondary | ICD-10-CM

## 2024-04-10 NOTE — Telephone Encounter (Signed)
 Patient dropped off document [employer] form, to be filled out by provider. Patient requested to Call Patient to pick up ASAP/ 5-days. Document is located in providers tray at front office.Please advise at Mobile [phone]. Form copied and handed to CMA.

## 2024-04-11 ENCOUNTER — Telehealth: Payer: Self-pay

## 2024-04-11 DIAGNOSIS — Z0279 Encounter for issue of other medical certificate: Secondary | ICD-10-CM

## 2024-04-11 NOTE — Telephone Encounter (Signed)
 Called patient and left detailed message informing him that DMV form will be placed up front for pick up. Mychart message will be sent as well.

## 2024-04-12 ENCOUNTER — Telehealth: Payer: Self-pay | Admitting: Family Medicine

## 2024-04-12 NOTE — Telephone Encounter (Signed)
 04/12/24 @ 8:50 Patient walked in and picked-up DMV Placard form

## 2024-04-12 NOTE — Telephone Encounter (Signed)
 Called and LM that I need to ask him some questions to help me fill out his disability forms.  Dr. Nasser Ku

## 2024-04-12 NOTE — Telephone Encounter (Signed)
 Asked him questions about the severity of his leg pain for his disability form.

## 2024-04-17 ENCOUNTER — Ambulatory Visit: Payer: Medicaid Other | Admitting: Family Medicine

## 2024-05-01 ENCOUNTER — Ambulatory Visit: Admitting: Physical Therapy

## 2024-05-07 ENCOUNTER — Ambulatory Visit: Attending: Family Medicine | Admitting: Physical Therapy

## 2024-05-07 DIAGNOSIS — M6281 Muscle weakness (generalized): Secondary | ICD-10-CM | POA: Diagnosis present

## 2024-05-07 DIAGNOSIS — M5459 Other low back pain: Secondary | ICD-10-CM | POA: Insufficient documentation

## 2024-05-07 DIAGNOSIS — M5416 Radiculopathy, lumbar region: Secondary | ICD-10-CM | POA: Insufficient documentation

## 2024-05-07 DIAGNOSIS — R262 Difficulty in walking, not elsewhere classified: Secondary | ICD-10-CM | POA: Diagnosis present

## 2024-05-07 NOTE — Therapy (Signed)
 OUTPATIENT PHYSICAL THERAPY THORACOLUMBAR EVALUATION   Patient Name: Tyler Winters MRN: [id number] DOB:18-Aug-1966, 58 y.o., male Today's Date: 05/07/2024  END OF SESSION:  PT End of Session - 05/07/24 0952     Visit Number 1    Number of Visits 17    Date for PT Re-Evaluation 07/05/24    PT Start Time 0951    PT Stop Time 1041    PT Time Calculation (min) 50 min    Activity Tolerance Patient limited by pain          Past Medical History:  Diagnosis Date   Allergy    Arthritis    Hypertension    Past Surgical History:  Procedure Laterality Date   COLON SURGERY     SPINE SURGERY     Patient Active Problem List   Diagnosis Date Noted   Hyperlipidemia, mixed 03/27/2024   Healthcare maintenance 03/27/2024   Lumbar radiculopathy, chronic 01/18/2024   Anxiety and depression 01/18/2024   Hypertension 01/18/2024   Left leg weakness 01/09/2021   Nicotine dependence 01/09/2021    PCP: Ziglar, Susan K, MD  REFERRING PROVIDER: Ziglar, Susan K, MD  REFERRING DIAG: M54.16 (ICD-10-CM) - Chronic left-sided lumbar radiculopathy   RATIONALE FOR EVALUATION AND TREATMENT: Rehabilitation  THERAPY DIAG: Other low back pain  ONSET DATE: 2023 around time of Sx  FOLLOW-UP APPT SCHEDULED WITH REFERRING PROVIDER: Yes ; next f/u with PCP 05/08/24   SUBJECTIVE:                                                                                                                                                                                         SUBJECTIVE STATEMENT:  Pt is a 58 year old male referred for chronic L-sided lumbar radiculopathy  PERTINENT HISTORY: Pt is a 58 year old male referred for chronic L-sided lumbar radiculopathy.  HX of DDD of the cervical spine (C4-C7, s/p allograft spine surgery), chronic myelopathy left leg (patient reports started after surgery 06/2022). Pt had prior C4-6 ACDF in 2019. Still has weakness in his left leg, loses his balance and falls. He  is walking with a cane now which helps. Pt reports some R upper limb motor control and fine motor limitation.   Pt reports some L-sided weakness and imbalance associated with this. He states it feels like I had a stroke, but I didn't have a stroke. He reports notable pain with sustained flexion activities e.g. dishes and bathing dog. Pt refrains from heavy lifting.   He reports limitation with LLE dorsiflexing/everting. Pt reports having to use railing/wall for stairs; difficulty with ascent and descent.    PAIN:    Pain Intensity: Present:  1/10, Best: 1/10, Worst: 10/10 Pain location: Lower lumbar region Pain Quality: slow, tense; will worsen with aggravating activities; axial low back; no notable sciatic-type symptoms or LE referred symptoms Radiating: No  Numbness/Tingling: Some numbness in R 4th-5th digits, no LE N/T reported  Focal Weakness: Yes, R hand from cervical myelopathy, L-sided weakness and difficulty lifting L foot  Aggravating factors: sustained forward lean activities  Relieving factors: leaning backward (sitting or standing), reclined sitting position, lying on back with deep breathing; sit to stand; prolonged standing 24-hour pain behavior: worse with specific activities How long can you sit: How long can you stand: History of prior back injury, pain, surgery, or therapy: No Dominant hand: right; pt uses L hand more now due to R hand impairment   Imaging: Yes   IMPRESSION: 1. No acute osseous or ligamentous findings in the spine. No clear explanation for the patient's symptoms. 2. Symmetric foci of T2 hyperintensity in the cord at C5. 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 No cord edema or expansion. The conus medullaris and cauda equina appear normal. 3. Multilevel cervical spondylosis post anterior discectomy and fusion at C5-6. Mild spinal stenosis at C4-5. Multilevel cervical foraminal  narrowing as detailed above. 4. Mild thoracic spine degenerative changes with small disc protrusions at T3-4 and T4-5. No cord deformity or foraminal compromise. 5. Moderate-sized left paracentral disc protrusion at L4-5 with mass effect on the thecal sac and possible left L5 nerve root encroachment.   Red flags: Negative for bowel/bladder changes, saddle paresthesia, personal history of cancer, h/o spinal tumors, h/o compression fx, h/o abdominal aneurysm, abdominal pain, chills/fever, night sweats, nausea, vomiting, unrelenting pain, first onset of insidious LBP <56 y/o  PRECAUTIONS: Fall  WEIGHT BEARING RESTRICTIONS: No  FALLS: Has patient fallen in last 6 months? Yes. Number of falls 3-4 falls each month  Living Environment Lives with: lives alone; sister lives close by in town; children are in [place] Lives in: House/apartment; 2-story apartment Stairs: Yes: Internal: 14 steps; on right going up Has following equipment at home: walking stick/cane  Prior level of function: Independent  Occupational demands: Out of work due to medical issues/injuries. Pt working on getting SS income   Hobbies: Pt enjoys bowling, video games, darts, playing pool  Patient Goals: Able to walk further, able to get legs to go   OBJECTIVE:  Patient Surveys  Modified Oswestry 16/50 = 32%    Cognition Patient is oriented to person, place, and time.  Recent memory is intact.  Remote memory is intact.  Attention span and concentration are intact.  Expressive speech is intact.  Patient's fund of knowledge is within normal limits for educational level.    Gross Musculoskeletal Assessment Tremor: None Bulk: Normal Tone: Normal No visible step-off along spinal column, no signs of scoliosis  GAIT: Distance walked: *** Assistive device utilized: {Assistive devices:23999} Level of assistance: {Levels of assistance:24026} Comments: ***  Posture: Lumbar lordosis: Decreased Mild inc thoracic  kyphosis Iliac crest height: Equal bilaterally Lumbar lateral shift: Negative  AROM AROM (Normal range in degrees) AROM  05/07/24  Lumbar   Flexion (65) 50%*  Extension (30) 50%* (more pain with extension)  Right lateral flexion (25) 50%  Left lateral flexion (25) 50%*  Right rotation (30) WFL  Left rotation (30) WFL      Hip Right Left  Flexion (125)    Extension (15)    Abduction (40)    Adduction  Internal Rotation (45)    External Rotation (45)        Knee    Flexion (135)    Extension (0)        Ankle    Dorsiflexion (20)    Plantarflexion (50)    Inversion (35)    Eversion (15)    (* = pain; Blank rows = not tested)  LE MMT: MMT (out of 5) Right 05/07/24 Left 05/07/24   Hip flexion 4 4-  Hip extension    Hip abduction    Hip adduction    Hip internal rotation    Hip external rotation    Knee flexion 4+ 4  Knee extension 5 4+  Ankle dorsiflexion 5 4-  Ankle plantarflexion    Ankle inversion    Ankle eversion    (* = pain; Blank rows = not tested)  Sensation Grossly intact to light touch throughout bilateral LEs with exception of L great toe. Proprioception, stereognosis, and hot/cold testing deferred on this date.  Reflexes R/L Bracioradialis: 2+/2+ Biceps: 1+/1+ Triceps: Unable to obtain/2+ Knee Jerk (L3/4): 4+/4+ (hyper***) Ankle Jerk (S1/2): 2+/2+  Clonus: Negative (2-3 beats per side) Hoffman's Sign: Negative  Muscle Length Hamstrings: R: {NEGATIVE/POSITIVE ZOX:09604} L: {NEGATIVE/POSITIVE VWU:98119} Ely (quadriceps): R: {NEGATIVE/POSITIVE JYN:82956} L: {NEGATIVE/POSITIVE OZH:08657} Thomas (hip flexors): R: {NEGATIVE/POSITIVE QIO:96295} L: {NEGATIVE/POSITIVE MWU:13244} Ober: R: {NEGATIVE/POSITIVE WNU:27253} L: {NEGATIVE/POSITIVE GUY:40347}  Palpation Location Right Left         Lumbar paraspinals    Quadratus Lumborum    Gluteus Maximus    Gluteus Medius    Deep hip external rotators    PSIS    Fortin's Area (SIJ)    Greater  Trochanter    (Blank rows = not tested) Graded on 0-4 scale (0 = no pain, 1 = pain, 2 = pain with wincing/grimacing/flinching, 3 = pain with withdrawal, 4 = unwilling to allow palpation)  Passive Accessory Intervertebral Motion Pt denies reproduction of back pain with CPA L1-L5 and UPA bilaterally L1-L5. Generally, hypomobile throughout  Special Tests Lumbar Radiculopathy and Discogenic: Centralization and Peripheralization (SN 92, -LR 0.12): {NEGATIVE/POSITIVE FOR:19998} Slump (SN 83, -LR 0.32): R: Negative L: Negative SLR (SN 92, -LR 0.29): R: {NEGATIVE/POSITIVE QQV:95638} L:  {NEGATIVE/POSITIVE VFI:43329} Crossed SLR (SP 90): R: {NEGATIVE/POSITIVE JJO:84166} L: {NEGATIVE/POSITIVE AYT:01601}  Facet Joint: Extension-Rotation (SN 100, -LR 0.0): R: {NEGATIVE/POSITIVE UXN:23557} L: {NEGATIVE/POSITIVE DUK:02542}  Lumbar Foraminal Stenosis: Lumbar quadrant (SN 70): R: Negative L: Positive (pain felt on R paraspinal)  Hip: FABER (SN 81): R: {NEGATIVE/POSITIVE HCW:23762} L: {NEGATIVE/POSITIVE GBT:51761} FADIR (SN 94): R: {NEGATIVE/POSITIVE FOR:19998} L: {NEGATIVE/POSITIVE YWV:37106} Hip scour (SN 50): R: {NEGATIVE/POSITIVE YIR:48546} L: {NEGATIVE/POSITIVE EVO:35009}  SIJ:  Thigh Thrust (SN 88, -LR 0.18) : R: {NEGATIVE/POSITIVE FGH:82993} L: {NEGATIVE/POSITIVE ZJI:96789}  Piriformis Syndrome: FAIR Test (SN 88, SP 83): R: {NEGATIVE/POSITIVE FYB:01751} L: {NEGATIVE/POSITIVE WCH:85277}  Functional Tasks Lifting: Deep squat: Sit to stand: Forward Step-Down Test: R:  L:  Lateral Step-Down Test: R:  L:   Beighton scale  LEFT  RIGHT           1. Passive dorsiflexion and hyperextension of the fifth MCP joint beyond 90  0 0   2. Passive apposition of the thumb to the flexor aspect of the forearm  0  0   3. Passive hyperextension of the elbow beyond 10  0  0   4. Passive hyperextension of the knee beyond 10  0  0   5. Active forward flexion of the trunk with the knees  fully extended so  that the palms of the hands rest flat on the floor   0   TOTAL         0/ 9      TODAY'S TREATMENT: DATE: 05/07/24    Therapeutic Exercise - for HEP establishment, discussion on appropriate exercise/activity modification, PT education   Reviewed baseline home exercises and provided handout for [facility] program (see Access Code); tactile cueing and therapist demonstration utilized as needed for carryover of proper technique to HEP.        PATIENT EDUCATION:  Education details: see above for patient education details Person educated: Patient Education method: Explanation, Demonstration, and Handouts Education comprehension: verbalized understanding   HOME EXERCISE PROGRAM:     ASSESSMENT:  CLINICAL IMPRESSION: Patient is a 58 y.o. male who was seen today for physical therapy evaluation and treatment for L-sided lumbar radiculopathy/low back pain with significant orthopedic history with old C5-6 ACDF in 2019; ***   OBJECTIVE IMPAIRMENTS: {opptimpairments:25111}.   ACTIVITY LIMITATIONS: {activitylimitations:27494}  PARTICIPATION LIMITATIONS: {participationrestrictions:25113}  PERSONAL FACTORS: {Personal factors:25162} are also affecting patient's functional outcome.   REHAB POTENTIAL: {rehabpotential:25112}  CLINICAL DECISION MAKING: {clinical decision making:25114}  EVALUATION COMPLEXITY: {Evaluation complexity:25115}   GOALS: Goals reviewed with patient? {yes/no:20286}  SHORT TERM GOALS: Target date: {follow up:25551}  Pt will be independent with HEP in order to improve strength and decrease back pain to improve pain-free function at home and work. Baseline: *** Goal status: INITIAL   LONG TERM GOALS: Target date: {follow up:25551}  Pt will increase FOTO to at least *** to demonstrate significant improvement in function at home and work related to back pain  Baseline:  Goal status: INITIAL  2.  Pt will decrease worst back pain by at least 2 points on the  NPRS in order to demonstrate clinically significant reduction in back pain. Baseline: *** Goal status: INITIAL  3.  Pt will decrease mODI score by at least 13 points in order demonstrate clinically significant reduction in back pain/disability.       Baseline: *** Goal status: INITIAL  4.  *** Baseline: *** Goal status: INITIAL   PLAN: PT FREQUENCY: 1-2x/week  PT DURATION: {rehab duration:25117}  PLANNED INTERVENTIONS: Therapeutic exercises, Therapeutic activity, Neuromuscular re-education, Balance training, Gait training, Patient/Family education, Self Care, Joint mobilization, Joint manipulation, Vestibular training, Canalith repositioning, Orthotic/Fit training, DME instructions, Dry Needling, Electrical stimulation, Spinal manipulation, Spinal mobilization, Cryotherapy, Moist heat, Taping, Traction, Ultrasound, Ionotophoresis 4mg /ml Dexamethasone , Manual therapy, and Re-evaluation.  PLAN FOR NEXT SESSION: ***   Denese Finn, PT, DPT [phone]  Aleatha Hunting, PT 05/07/2024, 12:25 PM

## 2024-05-07 NOTE — Therapy (Deleted)
 OUTPATIENT PHYSICAL THERAPY THORACOLUMBAR EVALUATION   Patient Name: Tyler Winters MRN: [id number] DOB:09-Apr-1966, 58 y.o., male Today's Date: 05/07/2024  END OF SESSION:   Past Medical History:  Diagnosis Date   Allergy    Arthritis    Hypertension    Past Surgical History:  Procedure Laterality Date   COLON SURGERY     SPINE SURGERY     Patient Active Problem List   Diagnosis Date Noted   Hyperlipidemia, mixed 03/27/2024   Healthcare maintenance 03/27/2024   Lumbar radiculopathy, chronic 01/18/2024   Anxiety and depression 01/18/2024   Hypertension 01/18/2024   Left leg weakness 01/09/2021   Nicotine dependence 01/09/2021    PCP: Ziglar, Susan K, MD  REFERRING PROVIDER: Ziglar, Susan K, MD  REFERRING DIAG: M54.16 (ICD-10-CM) - Chronic left-sided lumbar radiculopathy   RATIONALE FOR EVALUATION AND TREATMENT: {HABREHAB:27488}  THERAPY DIAG: No diagnosis found.  ONSET DATE: ***  FOLLOW-UP APPT SCHEDULED WITH REFERRING PROVIDER: {yes/no:20286}    SUBJECTIVE:                                                                                                                                                                                         SUBJECTIVE STATEMENT:  58 year old male with a HX of DDD of the cervical spine (C4-C7, s/p allograft spine surgery), chronic myelopathy left leg, patient reports started after surgery 06/2022).   PERTINENT HISTORY: 58 year old male with a HX of DDD of the cervical spine (C4-C7, s/p allograft spine surgery), chronic myelopathy left leg, patient reports started after surgery 06/2022).   PAIN:    Pain Intensity: Present: /10, Best: /10, Worst: /10 Pain location: *** Pain Quality: {PAIN DESCRIPTION:21022940}  Radiating: {yes/no:20286}  Numbness/Tingling: {yes/no:20286} Focal Weakness: {yes/no:20286} Aggravating factors: *** Relieving factors: *** 24-hour pain behavior: *** How long can you sit: How long can you  stand: History of prior back injury, pain, surgery, or therapy: {yes/no:20286} Dominant hand: {RIGHT/LEFT:20294} Imaging: {yes/no:20286}  Red flags: Negative for bowel/bladder changes, saddle paresthesia, personal history of cancer, h/o spinal tumors, h/o compression fx, h/o abdominal aneurysm, abdominal pain, chills/fever, night sweats, nausea, vomiting, unrelenting pain, first onset of insidious LBP <20 y/o  PRECAUTIONS: {Therapy precautions:24002}  WEIGHT BEARING RESTRICTIONS: {Yes ***/No:24003}  FALLS: Has patient fallen in last 6 months? {fallsyesno:27318}  Living Environment Lives with: {OP[facility] lives with:25569::lives with their family} Lives in: {Lives in:25570} Stairs: {opstairs:27293} Has following equipment at home: {Assistive devices:23999}  Prior level of function: {PLOF:24004}  Occupational demands:   Hobbies:   Patient Goals: ***   OBJECTIVE:  Patient Surveys  {rehab surveys:24030}  Cognition Patient is oriented to person, place, and time.  Recent memory is  intact.  Remote memory is intact.  Attention span and concentration are intact.  Expressive speech is intact.  Patient's fund of knowledge is within normal limits for educational level.    Gross Musculoskeletal Assessment Tremor: None Bulk: Normal Tone: Normal No visible step-off along spinal column, no signs of scoliosis  GAIT: Distance walked: *** Assistive device utilized: {Assistive devices:23999} Level of assistance: {Levels of assistance:24026} Comments: ***  Posture: Lumbar lordosis: WNL Iliac crest height: Equal bilaterally Lumbar lateral shift: Negative  AROM AROM (Normal range in degrees) AROM   Lumbar   Flexion (65)   Extension (30)   Right lateral flexion (25)   Left lateral flexion (25)   Right rotation (30)   Left rotation (30)       Hip Right Left  Flexion (125)    Extension (15)    Abduction (40)    Adduction     Internal Rotation (45)    External Rotation (45)         Knee    Flexion (135)    Extension (0)        Ankle    Dorsiflexion (20)    Plantarflexion (50)    Inversion (35)    Eversion (15)    (* = pain; Blank rows = not tested)  LE MMT: MMT (out of 5) Right  Left   Hip flexion    Hip extension    Hip abduction    Hip adduction    Hip internal rotation    Hip external rotation    Knee flexion    Knee extension    Ankle dorsiflexion    Ankle plantarflexion    Ankle inversion    Ankle eversion    (* = pain; Blank rows = not tested)  Sensation Grossly intact to light touch throughout bilateral LEs as determined by testing dermatomes L2-S2. Proprioception, stereognosis, and hot/cold testing deferred on this date.  Reflexes R/L Knee Jerk (L3/4): 2+/2+  Ankle Jerk (S1/2): 2+/2+   Muscle Length Hamstrings: R: {NEGATIVE/POSITIVE RUE:45409} L: {NEGATIVE/POSITIVE WJX:91478} Ely (quadriceps): R: {NEGATIVE/POSITIVE GNF:62130} L: {NEGATIVE/POSITIVE QMV:78469} Thomas (hip flexors): R: {NEGATIVE/POSITIVE GEX:52841} L: {NEGATIVE/POSITIVE LKG:40102} Ober: R: {NEGATIVE/POSITIVE VOZ:36644} L: {NEGATIVE/POSITIVE IHK:74259}  Palpation Location Right Left         Lumbar paraspinals    Quadratus Lumborum    Gluteus Maximus    Gluteus Medius    Deep hip external rotators    PSIS    Fortin's Area (SIJ)    Greater Trochanter    (Blank rows = not tested) Graded on 0-4 scale (0 = no pain, 1 = pain, 2 = pain with wincing/grimacing/flinching, 3 = pain with withdrawal, 4 = unwilling to allow palpation)  Passive Accessory Intervertebral Motion Pt denies reproduction of back pain with CPA L1-L5 and UPA bilaterally L1-L5. Generally, hypomobile throughout  Special Tests Lumbar Radiculopathy and Discogenic: Centralization and Peripheralization (SN 92, -LR 0.12): {NEGATIVE/POSITIVE FOR:19998} Slump (SN 83, -LR 0.32): R: {NEGATIVE/POSITIVE FOR:19998} L: {NEGATIVE/POSITIVE FOR:19998} SLR (SN 92, -LR 0.29): R: {NEGATIVE/POSITIVE DGL:87564} L:   {NEGATIVE/POSITIVE PPI:95188} Crossed SLR (SP 90): R: {NEGATIVE/POSITIVE CZY:60630} L: {NEGATIVE/POSITIVE ZSW:10932}  Facet Joint: Extension-Rotation (SN 100, -LR 0.0): R: {NEGATIVE/POSITIVE TFT:73220} L: {NEGATIVE/POSITIVE URK:27062}  Lumbar Foraminal Stenosis: Lumbar quadrant (SN 70): R: {NEGATIVE/POSITIVE BJS:28315} L: {NEGATIVE/POSITIVE VVO:16073}  Hip: FABER (SN 81): R: {NEGATIVE/POSITIVE XTG:62694} L: {NEGATIVE/POSITIVE WNI:62703} FADIR (SN 94): R: {NEGATIVE/POSITIVE JKK:93818} L: {NEGATIVE/POSITIVE EXH:37169} Hip scour (SN 50): R: {NEGATIVE/POSITIVE CVE:93810} L: {NEGATIVE/POSITIVE FBP:10258}  SIJ:  Thigh Thrust (SN 88, -LR  0.18) : R: {NEGATIVE/POSITIVE ZOX:09604} L: {NEGATIVE/POSITIVE VWU:98119}  Piriformis Syndrome: FAIR Test (SN 88, SP 83): R: {NEGATIVE/POSITIVE JYN:82956} L: {NEGATIVE/POSITIVE OZH:08657}  Functional Tasks Lifting: Deep squat: Sit to stand: Forward Step-Down Test: R:  L:  Lateral Step-Down Test: R:  L:   Beighton scale  LEFT  RIGHT           1. Passive dorsiflexion and hyperextension of the fifth MCP joint beyond 90  0 0   2. Passive apposition of the thumb to the flexor aspect of the forearm  0  0   3. Passive hyperextension of the elbow beyond 10  0  0   4. Passive hyperextension of the knee beyond 10  0  0   5. Active forward flexion of the trunk with the knees fully extended so that the palms of the hands rest flat on the floor   0   TOTAL         0/ 9      TODAY'S TREATMENT: DATE: ***     PATIENT EDUCATION:  Education details: *** Person educated: {Person educated:25204} Education method: {Education Method:25205} Education comprehension: {Education Comprehension:25206}   HOME EXERCISE PROGRAM:     ASSESSMENT:  CLINICAL IMPRESSION: Patient is a *** y.o. *** who was seen today for physical therapy evaluation and treatment for ***.   OBJECTIVE IMPAIRMENTS: {opptimpairments:25111}.   ACTIVITY LIMITATIONS:  {activitylimitations:27494}  PARTICIPATION LIMITATIONS: {participationrestrictions:25113}  PERSONAL FACTORS: {Personal factors:25162} are also affecting patient's functional outcome.   REHAB POTENTIAL: {rehabpotential:25112}  CLINICAL DECISION MAKING: {clinical decision making:25114}  EVALUATION COMPLEXITY: {Evaluation complexity:25115}   GOALS: Goals reviewed with patient? {yes/no:20286}  SHORT TERM GOALS: Target date: {follow up:25551}  Pt will be independent with HEP in order to improve strength and decrease back pain to improve pain-free function at home and work. Baseline: *** Goal status: INITIAL   LONG TERM GOALS: Target date: {follow up:25551}  Pt will  Baseline:  Goal status: INITIAL  2.  Pt will decrease worst back pain by at least 2 points on the NPRS in order to demonstrate clinically significant reduction in back pain. Baseline: *** Goal status: INITIAL  3.  Pt will decrease mODI score by at least 13 points in order demonstrate clinically significant reduction in back pain/disability.       Baseline: *** Goal status: INITIAL  4.  *** Baseline: *** Goal status: INITIAL   PLAN: PT FREQUENCY: 1-2x/week  PT DURATION: 6 weeks  PLANNED INTERVENTIONS: Therapeutic exercises, Therapeutic activity, Neuromuscular re-education, Balance training, Gait training, Patient/Family education, Self Care, Joint mobilization, Joint manipulation, Vestibular training, Canalith repositioning, Orthotic/Fit training, DME instructions, Dry Needling, Electrical stimulation, Spinal manipulation, Spinal mobilization, Cryotherapy, Moist heat, Taping, Traction, Ultrasound, Ionotophoresis 4mg /ml Dexamethasone , Manual therapy, and Re-evaluation.  PLAN FOR NEXT SESSION: ***   Denese Finn, PT, DPT [phone]  Aleatha Hunting, PT 05/07/2024, 8:10 AM

## 2024-05-08 ENCOUNTER — Encounter: Payer: Self-pay | Admitting: Family Medicine

## 2024-05-08 ENCOUNTER — Ambulatory Visit (INDEPENDENT_AMBULATORY_CARE_PROVIDER_SITE_OTHER): Admitting: Family Medicine

## 2024-05-08 VITALS — BP 149/90 | HR 61 | Temp 98.1°F | Resp 18 | Ht 66.0 in | Wt 200.0 lb

## 2024-05-08 DIAGNOSIS — M5416 Radiculopathy, lumbar region: Secondary | ICD-10-CM

## 2024-05-08 MED ORDER — PREGABALIN 25 MG PO CAPS: 25.0000 mg | ORAL_CAPSULE | Freq: Two times a day (BID) | ORAL | 0 refills | Status: DC

## 2024-05-08 NOTE — Progress Notes (Signed)
 Established Patient Office Visit  Subjective   Patient ID: Tyler Winters, male    DOB: 1966/03/31  Age: 58 y.o. MRN: [id number]  Chief Complaint  Patient presents with   Medical Management of Chronic Issues    HPI Delightful 58 year old gentleman with Hx of DDD of the cervical spine (C4-C7, s/p allograft surgery) chronic myelopathy affecting the left leg, (patient reports started after surgery 06/2022).  Lumbar radiculopathy, hypertension, mixed hyperlipidemia, Cologuard (negative 11/03/2021), elevated PHQ-9 and GAD-7 scores.  Discussed the use of AI scribe software for clinical note transcription with the patient, who gave verbal consent to proceed.  History of Present Illness   Tyler Winters is a 58 year old male with hypertension and sciatica who presents for medication management and physical therapy follow-up. He is accompanied by his wife.  He sometimes misses his blood pressure medication, approximately once a week.  He is currently taking ibuprofen, HCTZ and amlodipine , and a  rosuvastatin  10mg .  He felt lightheaded and dizzy once, which he attributes to taking his cholesterol medication without eating. He typically does not eat until late in the day, despite having food available at home.  He attended physical therapy yesterday for sciatica, where exercises for his knee and back were performed. He experiences pain during these exercises, similar to the pain he feels when bending over to wash dishes. He is scheduled to return to therapy tomorrow. He is not currently taking Lyrica (pregabalin) for his back pain, although it was previously prescribed. He experiences pain radiating to his legs, particularly the left leg, which gives him more problems.  He reports symptoms of anxiety and depression.  His PHQ 9 score is 13 and his GAD 7 score is 12.  He describes feeling unable to do things, which makes him 'mad', and acknowledges that his wife sometimes gets upset with  him. He copes by playing video games extensively, sometimes for days without sleeping or eating.         ROS    Objective:     BP (!) 157/109 (BP Location: Left Arm, Patient Position: Sitting, Cuff Size: Normal)   Pulse 66   Temp 98.1 F (36.7 C) (Oral)   Resp 18   Ht 5' 6 (1.676 m)   Wt 200 lb (90.7 kg)   SpO2 95%   BMI 32.28 kg/m    Physical Exam Vitals and nursing note reviewed.  Constitutional:      Appearance: Normal appearance.  HENT:     Head: Normocephalic and atraumatic.   Eyes:     Conjunctiva/sclera: Conjunctivae normal.    Cardiovascular:     Rate and Rhythm: Normal rate and regular rhythm.  Pulmonary:     Effort: Pulmonary effort is normal.     Breath sounds: Normal breath sounds.   Musculoskeletal:     Right lower leg: No edema.     Left lower leg: No edema.   Skin:    General: Skin is warm and dry.   Neurological:     Mental Status: He is alert and oriented to person, place, and time.     Deep Tendon Reflexes: Reflexes are normal and symmetric.     Comments: Positive straight leg raise 30 degrees bilaterally  Psychiatric:        Mood and Affect: Mood normal.        Behavior: Behavior normal.        Thought Content: Thought content normal.        Judgment: Judgment  normal.          No results found for any visits on 05/08/24.    The 10-year ASCVD risk score (Arnett DK, et al., 2019) is: 24.3%    Assessment & Plan:  Lumbar radiculopathy, chronic -     Pregabalin; Take 1 capsule (25 mg total) by mouth 2 (two) times daily.  Dispense: 60 capsule; Refill: 0  Assessment and Plan    Hypertension Occasional missed doses of antihypertensive medication. Emphasized importance of adherence to prevent stroke. - Ensure consistent adherence to antihypertensive medication. - Develop a backup plan to avoid missed doses.  Chronic Pain, lumbar radiculopathy Chronic back pain and sciatica. Not currently on pregabalin. Taking ibuprofen. -  Prescribe pregabalin 25 mg for back pain. - Evaluate effectiveness of pregabalin in one month. - Consider increasing pregabalin dosage if not effective.  Sciatica Undergoing physical therapy. Pain consistent with sciatica. Therapy not addressing leg instability due to cervical myelopathy - Continue physical therapy for sciatica. - Evaluate effectiveness of physical therapy in one month.  Anxiety and Depression High scores on anxiety and depression scales. Reluctant towards therapy and medication. Encouraged to consider medication. - Discuss potential benefits of medication for anxiety and depression. - Consider trial of medication for anxiety and depression if he agrees.  Hyperlipidemia Occasional lightheadedness possibly due to taking cholesterol medication without food. Importance of taking with food discussed. - Ensure cholesterol medication is taken with food to prevent lightheadedness.  General Health Maintenance Tendency to skip meals affecting health and medication effectiveness. Discussed importance of regular meals. - Encourage regular meal consumption to support overall health and medication effectiveness.         Return in about 4 weeks (around 06/05/2024).    Estefany Goebel K Lonnie Rosado, MD

## 2024-05-09 ENCOUNTER — Encounter: Payer: Self-pay | Admitting: Physical Therapy

## 2024-05-09 ENCOUNTER — Ambulatory Visit: Admitting: Physical Therapy

## 2024-05-09 DIAGNOSIS — M5459 Other low back pain: Secondary | ICD-10-CM | POA: Diagnosis not present

## 2024-05-09 DIAGNOSIS — M6281 Muscle weakness (generalized): Secondary | ICD-10-CM

## 2024-05-09 DIAGNOSIS — R262 Difficulty in walking, not elsewhere classified: Secondary | ICD-10-CM

## 2024-05-09 NOTE — Therapy (Signed)
 OUTPATIENT PHYSICAL THERAPY THORACOLUMBAR TREATMENT   Patient Name: Tyler Winters MRN: [id number] DOB:05/08/1966, 58 y.o., male Today's Date: 05/09/2024   END OF SESSION:  PT End of Session - 05/09/24 0826     Visit Number 2    Number of Visits 17    Date for PT Re-Evaluation 07/05/24    PT Start Time 0826    PT Stop Time 0905    PT Time Calculation (min) 39 min    Activity Tolerance Patient limited by pain           Past Medical History:  Diagnosis Date   Allergy    Arthritis    Hypertension    Past Surgical History:  Procedure Laterality Date   COLON SURGERY     SPINE SURGERY     Patient Active Problem List   Diagnosis Date Noted   Hyperlipidemia, mixed 03/27/2024   Healthcare maintenance 03/27/2024   Lumbar radiculopathy, chronic 01/18/2024   Anxiety and depression 01/18/2024   Hypertension 01/18/2024   Left leg weakness 01/09/2021   Nicotine dependence 01/09/2021    PCP: Ziglar, Susan K, MD  REFERRING PROVIDER: Ziglar, Susan K, MD  REFERRING DIAG: M54.16 (ICD-10-CM) - Chronic left-sided lumbar radiculopathy   RATIONALE FOR EVALUATION AND TREATMENT: Rehabilitation  THERAPY DIAG: Other low back pain  Difficulty in walking, not elsewhere classified  Muscle weakness (generalized)  ONSET DATE: 2023 around time of Sx  FOLLOW-UP APPT SCHEDULED WITH REFERRING PROVIDER: Yes ; next f/u with PCP 05/08/24  PERTINENT HISTORY: Pt is a 58 year old male referred for chronic L-sided lumbar radiculopathy.  HX of DDD of the cervical spine (C4-C7, s/p allograft spine surgery), chronic myelopathy left leg (patient reports started after aforementioned C-spine surgery 06/2022). Pt had prior C4-6 ACDF in 2019 with complications requiring additional C4-7 allograft surgery. Still has weakness in his left leg, loses his balance and falls. He is walking with a cane now which helps. Pt reports some R upper limb motor control and fine motor limitation.   Pt reports  some L-sided weakness and imbalance associated with this. He states it feels like I had a stroke, but I didn't have a stroke. He reports notable pain with sustained flexion activities e.g. dishes and bathing dog. Pt refrains from heavy lifting.   He reports limitation with LLE dorsiflexing/everting. Pt reports having to use railing/wall for stairs; difficulty with ascent and descent.    PAIN:    Pain Intensity: Present: 1/10, Best: 1/10, Worst: 10/10 Pain location: Lower lumbar region Pain Quality: slow, tense; will worsen with aggravating activities; axial low back; no notable sciatic-type symptoms or LE referred symptoms Radiating: No  Numbness/Tingling: Some numbness in R 4th-5th digits, no LE N/T reported  Focal Weakness: Yes, R hand from cervical myelopathy, L-sided weakness and difficulty lifting L foot  Aggravating factors: sustained forward lean activities  Relieving factors: leaning backward (sitting or standing), reclined sitting position, lying on back with deep breathing; sit to stand; prolonged standing 24-hour pain behavior: worse with specific activities How long can you sit: How long can you stand: History of prior back injury, pain, surgery, or therapy: No Dominant hand: right; pt uses L hand more now due to R hand impairment   Imaging: Yes   IMPRESSION: 1. No acute osseous or ligamentous findings in the spine. No clear explanation for the patient's symptoms. 2. Symmetric foci of T2 hyperintensity in the cord at C5. Given proximity to the prior cervical fusion, this is likely the sequela of remote  compressive myelopathy. No cord edema or expansion. The conus medullaris and cauda equina appear normal. 3. Multilevel cervical spondylosis post anterior discectomy and fusion at C5-6. Mild spinal stenosis at C4-5. Multilevel cervical foraminal narrowing as detailed above. 4. Mild thoracic spine degenerative changes with small disc protrusions at T3-4 and T4-5. No cord  deformity or foraminal compromise. 5. Moderate-sized left paracentral disc protrusion at L4-5 with mass effect on the thecal sac and possible left L5 nerve root encroachment.   Red flags: Negative for bowel/bladder changes, saddle paresthesia, personal history of cancer, h/o spinal tumors, h/o compression fx, h/o abdominal aneurysm, abdominal pain, chills/fever, night sweats, nausea, vomiting, unrelenting pain, first onset of insidious LBP <20 y/o  PRECAUTIONS: Fall  WEIGHT BEARING RESTRICTIONS: No  FALLS: Has patient fallen in last 6 months? Yes. Number of falls 3-4 falls each month  Living Environment Lives with: lives alone; sister lives close by in town; children are in [place] Lives in: House/apartment; 2-story apartment Stairs: Yes: Internal: 14 steps; on right going up Has following equipment at home: walking stick/cane  Prior level of function: Independent  Occupational demands: Out of work due to medical issues/injuries. Pt working on getting SS income   Hobbies: Pt enjoys bowling, video games, darts, playing pool  Patient Goals: Able to walk further, able to get legs to go   OBJECTIVE:  Patient Surveys  Modified Oswestry 16/50 = 32%    GAIT: Distance walked: 30 ft Assistive device utilized: pt uses SPC LUE (pt aware that this is not standard technique); completed gait trial with no AD for assessment Level of assistance: SBA Comments: Lateral trunk flexion during LLE swing phase to clear foot and dec stance time on LLE; decreased unipedal stance time L>R  Posture: Lumbar lordosis: Decreased Mild inc thoracic kyphosis Iliac crest height: Equal bilaterally Lumbar lateral shift: Negative  AROM AROM (Normal range in degrees) AROM  05/07/24  Lumbar   Flexion (65) 50%*  Extension (30) 50%* (more pain with extension)  Right lateral flexion (25) 50%  Left lateral flexion (25) 50%*  Right rotation (30) WFL  Left rotation (30) WFL      Hip Right Left  Flexion  (125)    Extension (15)    Abduction (40)    Adduction     Internal Rotation (45)    External Rotation (45)        (* = pain; Blank rows = not tested)  LE MMT: MMT (out of 5) Right 05/07/24 Left 05/07/24   Hip flexion 4 4-  Hip extension 4- 4-  Hip abduction    Hip adduction    Hip internal rotation    Hip external rotation    Knee flexion 4+ 4  Knee extension 5 4+  Ankle dorsiflexion 5 4-  Ankle plantarflexion    Ankle inversion    Ankle eversion    (* = pain; Blank rows = not tested)  Sensation Grossly intact to light touch throughout bilateral LEs with exception of L great toe. Proprioception, stereognosis, and hot/cold testing deferred on this date.  Reflexes R/L Bracioradialis: 2+/2+ Biceps: 1+/1+ Triceps: Unable to obtain/2+ Knee Jerk (L3/4): 3+/3+  Ankle Jerk (S1/2): 2+/2+  Clonus: Negative (2-3 beats per side) Hoffman's Sign: Negative  Muscle Length Hamstrings: R: Positive L: Positive Ely (quadriceps): R: Not examined L: Not examined   Palpation Location Right Left         Lumbar paraspinals 1 2  Quadratus Lumborum 0 0  Gluteus Maximus 1 1  Gluteus Medius 1 1  Deep hip external rotators    PSIS    Fortin's Area (SIJ)    Greater Trochanter    (Blank rows = not tested) Graded on 0-4 scale (0 = no pain, 1 = pain, 2 = pain with wincing/grimacing/flinching, 3 = pain with withdrawal, 4 = unwilling to allow palpation)  Passive Accessory Intervertebral Motion (updated 05/09/24) Hypomobile L3-L5; pain at restriction   Special Tests Lumbar Radiculopathy and Discogenic: Centralization and Peripheralization (SN 92, -LR 0.12): Repeated extension in lying; mild axial pain afterward, no c/o of increased pain after brief rest period (will need further assessment Slump (SN 83, -LR 0.32): R: Negative L: Negative SLR (SN 92, -LR 0.29): R: Negative L:  Negative  Facet Joint: Extension-Rotation (SN 100, -LR 0.0): R: Not examined L: Not examined  Lumbar Foraminal  Stenosis: Lumbar quadrant (SN 70): R: Negative L: Positive (pain felt on R paraspinal)     TODAY'S TREATMENT: DATE: 05/09/2024   SUBJECTIVE STATEMENT:   Pt started Pregabalin per PCP and he reports significant improvement in pain with this. Patient reports 2/10 pain affecting axial lumbar spine at arrival. Patient reports being a little sore after Monday.    Update for passive accessories, hip extension MMT Tested general lumbar compression and traction in hooklying: no notable symptomatic response   Manual Therapy - for symptom modulation, soft tissue sensitivity and mobility, joint mobility, ROM   STM longissimus lumborum L3-5 bilaterally; x 8 minutes    Therapeutic Exercise - for improved soft tissue flexibility and extensibility as needed for ROM, improved strength as needed to improve performance of CKC activities/functional movements   Repeated extension in lying; 1 x 10  -tension in back during, moderate axial discomfort initially after  -pt exhibits improved tolerance and motion with flexion and bilateral lateral flexion  Lower trunk rotations; 1 x 10, 2 sec hold at end-range  -for HEP update  Bridge; 1 x 10, maintenance of neutral spine  -for HEP update  Standing heel raise/toe raise; light touch on adjacent chair; 1 x 10  -for HEP update   PATIENT EDUCATION: Discussed availability of dorsiflexion-assist low profile brace that can be purchased online to improve dorsiflexion during LLE swing phase and reduce likelihood of tripping/falling in context and frequent falls. Reviewed role of repeated movement program and when to stop exercise if axial pain is significantly worse or if experiencing more peripheral symptoms.    *next visit* Stair negotiation; up/down staircase in center of gym Completion of TUG and 5TSTS   PATIENT EDUCATION:  Education details: see above for patient education details Person educated: Patient Education method: Explanation,  Demonstration, and Handouts Education comprehension: verbalized understanding   HOME EXERCISE PROGRAM:  Access Code: [email or web address] URL: [email or web address] Date: 05/09/2024 Prepared by: Denese Finn  Exercises - Prone Press Up  - 5-6 x daily - 7 x weekly - 1 sets - 10 reps - 1sec hold - Supine Hamstring Stretch  - 2 x daily - 7 x weekly - 2 sets - 10 reps - 1sec hold - Supine Lower Trunk Rotation  - 2 x daily - 7 x weekly - 2 sets - 10 reps - Supine Bridge  - 1 x daily - 7 x weekly - 2 sets - 10 reps - Standing Toe Raises at Chair  - 2 x daily - 7 x weekly - 2 sets - 10 reps   ASSESSMENT:  CLINICAL IMPRESSION: Patient has notably improved axial low back pain with updated Rx for Pregabalin  from his PCP. He reports some discomfort associated with repeated movement, though he tolerates prone/prone on elbows well and does appear to have improved thoracolumbar AROM baselines (though this may be due to updated Rx and reduced pain versus therapeutic effect of repeated movement). HEP was updated for gentle lower trunk rotations and bridging to address patient-specific impairments. We discussed use of dorsiflexion-assist devices available online for improved foot clearance and reduced drag during gait. Pt has current deficits in: thoracolumbar AROM, posterior chain/hamstrings decreased flexibility, L>R lower limb weakness and apparent L5 myotomal deficit, associated gait changes, and axial pain affecting lower lumbar region. Pt will continue to benefit from skilled PT services to address deficits and improve function.   OBJECTIVE IMPAIRMENTS: Abnormal gait, decreased balance, difficulty walking, decreased ROM, decreased strength, hypomobility, impaired flexibility, postural dysfunction, and pain.   ACTIVITY LIMITATIONS: carrying, lifting, bending, sitting, standing, squatting, stairs, transfers, and locomotion level  PARTICIPATION LIMITATIONS: meal prep, cleaning, driving, and  community activity  PERSONAL FACTORS: Past/current experiences, Time since onset of injury/illness/exacerbation, and 3+ comorbidities: (HTN, OA, anxiety, depression, Hx of ACDF and cervical myelopathy)are also affecting patient's functional outcome.   REHAB POTENTIAL: Good  CLINICAL DECISION MAKING: Unstable/unpredictable  EVALUATION COMPLEXITY: High   GOALS: Goals reviewed with patient? Yes  SHORT TERM GOALS: Target date: 05/29/2024  Pt will be independent with HEP in order to improve strength and decrease back pain to improve pain-free function at home and work. Baseline: 05/07/24: Baseline HEP initiated  Goal status: INITIAL  2.  Pt will decrease worst back pain by at least 2 points on the NPRS in order to demonstrate clinically significant reduction in back pain. Baseline: 05/07/24: 10/10 pain at worst.  Goal status: INITIAL   LONG TERM GOALS: Target date: 07/05/2024  Pt will decrease mODI score by at least 13 points in order demonstrate clinically significant reduction in back pain/disability.    Baseline: 05/07/24: 32% Goal status: INITIAL  2.  Pt will decrease worst back pain to no more than 2-3/10 on the NPRS in order to demonstrate clinically significant reduction in back pain. Baseline: 05/07/24: 10/10 at worst.  Goal status: INITIAL  3.  Patient will have full thoracolumbar AROM without reproduction of pain as needed for reaching items on ground, household chores, bending.  Baseline: 05/07/24: Motion loss and pain with flexion/extension, L lateral flexion. Motion loss R lateral flexion.  Goal status: INITIAL  4.  Patient will demonstrate safe negotiation of flight of stairs with R handrail support without reproduction of pain and adequate toe clearance on each step as needed for getting to upstairs bedroom in his apartment.  Baseline: 05/07/24: Notable difficulty with stair negotiation.  Goal status: INITIAL  5.  Patient will improve L lower limb MMTs to 4+/5 or greater  indicative of improved strength as needed for lower limb lifting during self-care ADLs and adequate dorsiflexor strength for loading response during gait cycle and toe clearance during LLE swing.  Baseline: 05/07/24: MMTs 4- to 4 with exception of srong L quadriceps.  Goal status: INITIAL   PLAN: PT FREQUENCY: 1-2x/week  PT DURATION: 8 weeks  PLANNED INTERVENTIONS: Therapeutic exercises, Therapeutic activity, Neuromuscular re-education, Balance training, Gait training, Patient/Family education, Self Care, Joint mobilization, Joint manipulation, Vestibular training, Canalith repositioning, Orthotic/Fit training, DME instructions, Dry Needling, Electrical stimulation, Spinal manipulation, Spinal mobilization, Cryotherapy, Moist heat, Taping, Traction, Ultrasound, Ionotophoresis 4mg /ml Dexamethasone , Manual therapy, and Re-evaluation.  PLAN FOR NEXT SESSION: Re-assess response with extension in lying (trial of prone press up today). Continue  with hamstrings dynamic stretching and progressive hip/trunk strengthening. Progress with standing pre-gait and gait training drills as able. Complete fall risk testing at future date.    Denese Finn, PT, DPT #[id number]  Tyler Winters, PT 05/09/2024, 9:11 AM

## 2024-05-14 ENCOUNTER — Ambulatory Visit: Admitting: Physical Therapy

## 2024-05-14 DIAGNOSIS — M5459 Other low back pain: Secondary | ICD-10-CM

## 2024-05-14 DIAGNOSIS — R262 Difficulty in walking, not elsewhere classified: Secondary | ICD-10-CM

## 2024-05-14 DIAGNOSIS — M6281 Muscle weakness (generalized): Secondary | ICD-10-CM

## 2024-05-14 NOTE — Therapy (Signed)
 OUTPATIENT PHYSICAL THERAPY THORACOLUMBAR TREATMENT   Patient Name: Tyler Winters MRN: [id number] DOB:Feb 13, 1966, 58 y.o., male Today's Date: 05/14/2024   END OF SESSION:  PT End of Session - 05/14/24 0948     Visit Number 3    Number of Visits 17    Date for PT Re-Evaluation 07/05/24    PT Start Time 0950    PT Stop Time 1035    PT Time Calculation (min) 45 min    Activity Tolerance Patient limited by pain            Past Medical History:  Diagnosis Date   Allergy    Arthritis    Hypertension    Past Surgical History:  Procedure Laterality Date   COLON SURGERY     SPINE SURGERY     Patient Active Problem List   Diagnosis Date Noted   Hyperlipidemia, mixed 03/27/2024   Healthcare maintenance 03/27/2024   Lumbar radiculopathy, chronic 01/18/2024   Anxiety and depression 01/18/2024   Hypertension 01/18/2024   Left leg weakness 01/09/2021   Nicotine dependence 01/09/2021    PCP: Ziglar, Susan K, MD  REFERRING PROVIDER: Ziglar, Susan K, MD  REFERRING DIAG: M54.16 (ICD-10-CM) - Chronic left-sided lumbar radiculopathy   RATIONALE FOR EVALUATION AND TREATMENT: Rehabilitation  THERAPY DIAG: Other low back pain  Difficulty in walking, not elsewhere classified  Muscle weakness (generalized)  ONSET DATE: 2023 around time of Sx  FOLLOW-UP APPT SCHEDULED WITH REFERRING PROVIDER: Yes ; next f/u with PCP 05/08/24  PERTINENT HISTORY: Pt is a 58 year old male referred for chronic L-sided lumbar radiculopathy.  HX of DDD of the cervical spine (C4-C7, s/p allograft spine surgery), chronic myelopathy left leg (patient reports started after aforementioned C-spine surgery 06/2022). Pt had prior C4-6 ACDF in 2019 with complications requiring additional C4-7 allograft surgery. Still has weakness in his left leg, loses his balance and falls. He is walking with a cane now which helps. Pt reports some R upper limb motor control and fine motor limitation.   Pt reports  some L-sided weakness and imbalance associated with this. He states it feels like I had a stroke, but I didn't have a stroke. He reports notable pain with sustained flexion activities e.g. dishes and bathing dog. Pt refrains from heavy lifting.   He reports limitation with LLE dorsiflexing/everting. Pt reports having to use railing/wall for stairs; difficulty with ascent and descent.    PAIN:    Pain Intensity: Present: 1/10, Best: 1/10, Worst: 10/10 Pain location: Lower lumbar region Pain Quality: slow, tense; will worsen with aggravating activities; axial low back; no notable sciatic-type symptoms or LE referred symptoms Radiating: No  Numbness/Tingling: Some numbness in R 4th-5th digits, no LE N/T reported  Focal Weakness: Yes, R hand from cervical myelopathy, L-sided weakness and difficulty lifting L foot  Aggravating factors: sustained forward lean activities  Relieving factors: leaning backward (sitting or standing), reclined sitting position, lying on back with deep breathing; sit to stand; prolonged standing 24-hour pain behavior: worse with specific activities How long can you sit: How long can you stand: History of prior back injury, pain, surgery, or therapy: No Dominant hand: right; pt uses L hand more now due to R hand impairment   Imaging: Yes   IMPRESSION: 1. No acute osseous or ligamentous findings in the spine. No clear explanation for the patient's symptoms. 2. Symmetric foci of T2 hyperintensity in the cord at C5. Given proximity to the prior cervical fusion, this is likely the sequela of  remote compressive myelopathy. No cord edema or expansion. The conus medullaris and cauda equina appear normal. 3. Multilevel cervical spondylosis post anterior discectomy and fusion at C5-6. Mild spinal stenosis at C4-5. Multilevel cervical foraminal narrowing as detailed above. 4. Mild thoracic spine degenerative changes with small disc protrusions at T3-4 and T4-5. No cord  deformity or foraminal compromise. 5. Moderate-sized left paracentral disc protrusion at L4-5 with mass effect on the thecal sac and possible left L5 nerve root encroachment.   Red flags: Negative for bowel/bladder changes, saddle paresthesia, personal history of cancer, h/o spinal tumors, h/o compression fx, h/o abdominal aneurysm, abdominal pain, chills/fever, night sweats, nausea, vomiting, unrelenting pain, first onset of insidious LBP <20 y/o  PRECAUTIONS: Fall  WEIGHT BEARING RESTRICTIONS: No  FALLS: Has patient fallen in last 6 months? Yes. Number of falls 3-4 falls each month  Living Environment Lives with: lives alone; sister lives close by in town; children are in [place] Lives in: House/apartment; 2-story apartment Stairs: Yes: Internal: 14 steps; on right going up Has following equipment at home: walking stick/cane  Prior level of function: Independent  Occupational demands: Out of work due to medical issues/injuries. Pt working on getting SS income   Hobbies: Pt enjoys bowling, video games, darts, playing pool  Patient Goals: Able to walk further, able to get legs to go   OBJECTIVE:  Patient Surveys  Modified Oswestry 16/50 = 32%    GAIT: Distance walked: 30 ft Assistive device utilized: pt uses SPC LUE (pt aware that this is not standard technique); completed gait trial with no AD for assessment Level of assistance: SBA Comments: Lateral trunk flexion during LLE swing phase to clear foot and dec stance time on LLE; decreased unipedal stance time L>R  Posture: Lumbar lordosis: Decreased Mild inc thoracic kyphosis Iliac crest height: Equal bilaterally Lumbar lateral shift: Negative  AROM AROM (Normal range in degrees) AROM  05/07/24  Lumbar   Flexion (65) 50%*  Extension (30) 50%* (more pain with extension)  Right lateral flexion (25) 50%  Left lateral flexion (25) 50%*  Right rotation (30) WFL  Left rotation (30) WFL      Hip Right Left  Flexion  (125)    Extension (15)    Abduction (40)    Adduction     Internal Rotation (45)    External Rotation (45)        (* = pain; Blank rows = not tested)  LE MMT: MMT (out of 5) Right 05/07/24 Left 05/07/24   Hip flexion 4 4-  Hip extension 4- 4-  Hip abduction    Hip adduction    Hip internal rotation    Hip external rotation    Knee flexion 4+ 4  Knee extension 5 4+  Ankle dorsiflexion 5 4-  Ankle plantarflexion    Ankle inversion    Ankle eversion    (* = pain; Blank rows = not tested)  Sensation Grossly intact to light touch throughout bilateral LEs with exception of L great toe. Proprioception, stereognosis, and hot/cold testing deferred on this date.  Reflexes R/L Bracioradialis: 2+/2+ Biceps: 1+/1+ Triceps: Unable to obtain/2+ Knee Jerk (L3/4): 3+/3+  Ankle Jerk (S1/2): 2+/2+  Clonus: Negative (2-3 beats per side) Hoffman's Sign: Negative  Muscle Length Hamstrings: R: Positive L: Positive Ely (quadriceps): R: Not examined L: Not examined   Palpation Location Right Left         Lumbar paraspinals 1 2  Quadratus Lumborum 0 0  Gluteus Maximus 1 1  Gluteus Medius 1 1  Deep hip external rotators    PSIS    Fortin's Area (SIJ)    Greater Trochanter    (Blank rows = not tested) Graded on 0-4 scale (0 = no pain, 1 = pain, 2 = pain with wincing/grimacing/flinching, 3 = pain with withdrawal, 4 = unwilling to allow palpation)  Passive Accessory Intervertebral Motion (updated 05/09/24) Hypomobile L3-L5; pain at restriction   Special Tests Lumbar Radiculopathy and Discogenic: Centralization and Peripheralization (SN 92, -LR 0.12): Repeated extension in lying; mild axial pain afterward, no c/o of increased pain after brief rest period (will need further assessment Slump (SN 83, -LR 0.32): R: Negative L: Negative SLR (SN 92, -LR 0.29): R: Negative L:  Negative  Facet Joint: Extension-Rotation (SN 100, -LR 0.0): R: Not examined L: Not examined  Lumbar Foraminal  Stenosis: Lumbar quadrant (SN 70): R: Negative L: Positive (pain felt on R paraspinal)     TODAY'S TREATMENT: DATE: 05/14/2024   SUBJECTIVE STATEMENT:   Pt reports Lyrica is still helping with low back pain; pt using this 2x/day. Pt reports no radiating/referred pain. Pt reports axial low back pain mainly; no pain across waist or into flank on either side. He reports significant time required to complete HEP on Med[employer] GO app. Pt reports attempted use of chair versus counter/sink for standing toe raise and falling backward due to this - non-injurious fall per pt.     Manual Therapy - for symptom modulation, soft tissue sensitivity and mobility, joint mobility, ROM   STM/IASTM with Hypervolt along longissimus lumborum L3-5 bilaterally; x 8 minutes    Therapeutic Exercise - for improved soft tissue flexibility and extensibility as needed for ROM, improved strength as needed to improve performance of CKC activities/functional movements   NuStep; Level 3, x 5 minutes - for improved soft tissue mobility and increased tissue temperature to improve muscle performance   -subjective gathered during this time  -intermittent indirect time, 3 minutes not billed   Repeated extension in lying; 1 x 10  -tension in back during, moderate axial discomfort initially after  -minimal symptoms in lying immediately after  Lower trunk rotations; 2 x 10, 2 sec hold at end-range  Bridge; 1 x 10, maintenance of neutral spine  Quadruped side bend; 1 x 10 alternating R/L  Hurdle step over single ankle weight flat on floor; 2 x 10 with either LE  Stair negotiation; up/down staircase in center of gym; x 1 with bilat handrail support, x 1 with R handrail going up   TUG: 14.6 sec 5TSTS: 16.5 sec    PATIENT EDUCATION: Discussed role of repeated movement program and expectations with repeated extension. Discussed self-pacing of HEP and ensuring performance of sets/reps per program provided. Discussed  safe performance of HEP with use of kitchen sink for standing toe raises.     *not today* Standing heel raise/toe raise; light touch on adjacent chair; 1 x 10  -for HEP update   PATIENT EDUCATION:  Education details: see above for patient education details Person educated: Patient Education method: Explanation, Demonstration, and Handouts Education comprehension: verbalized understanding   HOME EXERCISE PROGRAM:  Access Code: [email or web address] URL: [email or web address] Date: 05/09/2024 Prepared by: Venetia Endo  Exercises - Prone Press Up  - 5-6 x daily - 7 x weekly - 1 sets - 10 reps - 1sec hold - Supine Hamstring Stretch  - 2 x daily - 7 x weekly - 2 sets - 10 reps - 1sec hold - Supine Lower Trunk Rotation  -  2 x daily - 7 x weekly - 2 sets - 10 reps - Supine Bridge  - 1 x daily - 7 x weekly - 2 sets - 10 reps - Standing Toe Raises at Chair  - 2 x daily - 7 x weekly - 2 sets - 10 reps   ASSESSMENT:  CLINICAL IMPRESSION: Patient has ongoing gait deficits with notable circumduction and contralateral trunk sidebend with LLE swing phase. He has primarily axial back pain and sensitivity along L3-5 longissimus lumborum; pt has fair response to manual therapy and reports no notable pain, but some feeling of numbness immediately afterward. We completed additional work on re-training heel to toe stepping and ensuring adequate active dorsiflexion to perform forward step. Pt has current deficits in: thoracolumbar AROM, posterior chain/hamstrings decreased flexibility, L>R lower limb weakness and apparent L5 myotomal deficit, associated gait changes, and axial pain affecting lower lumbar region. Pt will continue to benefit from skilled PT services to address deficits and improve function.   OBJECTIVE IMPAIRMENTS: Abnormal gait, decreased balance, difficulty walking, decreased ROM, decreased strength, hypomobility, impaired flexibility, postural dysfunction, and pain.   ACTIVITY  LIMITATIONS: carrying, lifting, bending, sitting, standing, squatting, stairs, transfers, and locomotion level  PARTICIPATION LIMITATIONS: meal prep, cleaning, driving, and community activity  PERSONAL FACTORS: Past/current experiences, Time since onset of injury/illness/exacerbation, and 3+ comorbidities: (HTN, OA, anxiety, depression, Hx of ACDF and cervical myelopathy)are also affecting patient's functional outcome.   REHAB POTENTIAL: Good  CLINICAL DECISION MAKING: Unstable/unpredictable  EVALUATION COMPLEXITY: High   GOALS: Goals reviewed with patient? Yes  SHORT TERM GOALS: Target date: 05/29/2024  Pt will be independent with HEP in order to improve strength and decrease back pain to improve pain-free function at home and work. Baseline: 05/07/24: Baseline HEP initiated  Goal status: INITIAL  2.  Pt will decrease worst back pain by at least 2 points on the NPRS in order to demonstrate clinically significant reduction in back pain. Baseline: 05/07/24: 10/10 pain at worst.  Goal status: INITIAL   LONG TERM GOALS: Target date: 07/05/2024  Pt will decrease mODI score by at least 13 points in order demonstrate clinically significant reduction in back pain/disability.    Baseline: 05/07/24: 32% Goal status: INITIAL  2.  Pt will decrease worst back pain to no more than 2-3/10 on the NPRS in order to demonstrate clinically significant reduction in back pain. Baseline: 05/07/24: 10/10 at worst.  Goal status: INITIAL  3.  Patient will have full thoracolumbar AROM without reproduction of pain as needed for reaching items on ground, household chores, bending.  Baseline: 05/07/24: Motion loss and pain with flexion/extension, L lateral flexion. Motion loss R lateral flexion.  Goal status: INITIAL  4.  Patient will demonstrate safe negotiation of flight of stairs with R handrail support without reproduction of pain and adequate toe clearance on each step as needed for getting to upstairs  bedroom in his apartment.  Baseline: 05/07/24: Notable difficulty with stair negotiation.  Goal status: INITIAL  5.  Patient will improve L lower limb MMTs to 4+/5 or greater indicative of improved strength as needed for lower limb lifting during self-care ADLs and adequate dorsiflexor strength for loading response during gait cycle and toe clearance during LLE swing.  Baseline: 05/07/24: MMTs 4- to 4 with exception of srong L quadriceps.  Goal status: INITIAL   PLAN: PT FREQUENCY: 1-2x/week  PT DURATION: 8 weeks  PLANNED INTERVENTIONS: Therapeutic exercises, Therapeutic activity, Neuromuscular re-education, Balance training, Gait training, Patient/Family education, Self Care, Joint mobilization,  Joint manipulation, Vestibular training, Canalith repositioning, Orthotic/Fit training, DME instructions, Dry Needling, Electrical stimulation, Spinal manipulation, Spinal mobilization, Cryotherapy, Moist heat, Taping, Traction, Ultrasound, Ionotophoresis 4mg /ml Dexamethasone , Manual therapy, and Re-evaluation.  PLAN FOR NEXT SESSION: Re-assess response with extension in lying (trial of prone press up today). Continue with hamstrings dynamic stretching and progressive hip/trunk strengthening. Progress with standing pre-gait and gait training drills as able. Further fall risk testing as indicated.    Venetia Endo, PT, DPT #[id number]  Venetia ONEIDA Endo, PT 05/14/2024, 10:50 AM

## 2024-05-16 ENCOUNTER — Ambulatory Visit: Admitting: Physical Therapy

## 2024-05-16 NOTE — Therapy (Deleted)
 OUTPATIENT PHYSICAL THERAPY THORACOLUMBAR TREATMENT   Patient Name: Tyler Winters MRN: [id number] DOB:02/09/1966, 58 y.o., male Today's Date: 05/16/2024   END OF SESSION:      Past Medical History:  Diagnosis Date   Allergy    Arthritis    Hypertension    Past Surgical History:  Procedure Laterality Date   COLON SURGERY     SPINE SURGERY     Patient Active Problem List   Diagnosis Date Noted   Hyperlipidemia, mixed 03/27/2024   Healthcare maintenance 03/27/2024   Lumbar radiculopathy, chronic 01/18/2024   Anxiety and depression 01/18/2024   Hypertension 01/18/2024   Left leg weakness 01/09/2021   Nicotine dependence 01/09/2021    PCP: Ziglar, Susan K, MD  REFERRING PROVIDER: Ziglar, Susan K, MD  REFERRING DIAG: M54.16 (ICD-10-CM) - Chronic left-sided lumbar radiculopathy   RATIONALE FOR EVALUATION AND TREATMENT: Rehabilitation  THERAPY DIAG: Other low back pain  Difficulty in walking, not elsewhere classified  Muscle weakness (generalized)  ONSET DATE: 2023 around time of Sx  FOLLOW-UP APPT SCHEDULED WITH REFERRING PROVIDER: Yes ; next f/u with PCP 05/08/24  PERTINENT HISTORY: Pt is a 58 year old male referred for chronic L-sided lumbar radiculopathy.  HX of DDD of the cervical spine (C4-C7, s/p allograft spine surgery), chronic myelopathy left leg (patient reports started after aforementioned C-spine surgery 06/2022). Pt had prior C4-6 ACDF in 2019 with complications requiring additional C4-7 allograft surgery. Still has weakness in his left leg, loses his balance and falls. He is walking with a cane now which helps. Pt reports some R upper limb motor control and fine motor limitation.   Pt reports some L-sided weakness and imbalance associated with this. He states it feels like I had a stroke, but I didn't have a stroke. He reports notable pain with sustained flexion activities e.g. dishes and bathing dog. Pt refrains from heavy lifting.   He  reports limitation with LLE dorsiflexing/everting. Pt reports having to use railing/wall for stairs; difficulty with ascent and descent.    PAIN:    Pain Intensity: Present: 1/10, Best: 1/10, Worst: 10/10 Pain location: Lower lumbar region Pain Quality: slow, tense; will worsen with aggravating activities; axial low back; no notable sciatic-type symptoms or LE referred symptoms Radiating: No  Numbness/Tingling: Some numbness in R 4th-5th digits, no LE N/T reported  Focal Weakness: Yes, R hand from cervical myelopathy, L-sided weakness and difficulty lifting L foot  Aggravating factors: sustained forward lean activities  Relieving factors: leaning backward (sitting or standing), reclined sitting position, lying on back with deep breathing; sit to stand; prolonged standing 24-hour pain behavior: worse with specific activities How long can you sit: How long can you stand: History of prior back injury, pain, surgery, or therapy: No Dominant hand: right; pt uses L hand more now due to R hand impairment   Imaging: Yes   IMPRESSION: 1. No acute osseous or ligamentous findings in the spine. No clear explanation for the patient's symptoms. 2. Symmetric foci of T2 hyperintensity in the cord at C5. 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 No cord edema or expansion. The conus medullaris and cauda equina appear normal. 3. Multilevel cervical spondylosis post anterior discectomy and fusion at C5-6. Mild spinal stenosis at C4-5. Multilevel cervical foraminal narrowing as detailed above. 4. Mild thoracic spine degenerative changes with small disc protrusions at T3-4 and T4-5. No cord deformity or foraminal compromise. 5. Moderate-sized left paracentral disc protrusion at L4-5 with  mass effect on the thecal sac and possible left L5 nerve root encroachment.   Red flags: Negative for bowel/bladder changes, saddle paresthesia,  personal history of cancer, h/o spinal tumors, h/o compression fx, h/o abdominal aneurysm, abdominal pain, chills/fever, night sweats, nausea, vomiting, unrelenting pain, first onset of insidious LBP <20 y/o  PRECAUTIONS: Fall  WEIGHT BEARING RESTRICTIONS: No  FALLS: Has patient fallen in last 6 months? Yes. Number of falls 3-4 falls each month  Living Environment Lives with: lives alone; sister lives close by in town; children are in [place] Lives in: House/apartment; 2-story apartment Stairs: Yes: Internal: 14 steps; on right going up Has following equipment at home: walking stick/cane  Prior level of function: Independent  Occupational demands: Out of work due to medical issues/injuries. Pt working on getting SS income   Hobbies: Pt enjoys bowling, video games, darts, playing pool  Patient Goals: Able to walk further, able to get legs to go   OBJECTIVE:  Patient Surveys  Modified Oswestry 16/50 = 32%    GAIT: Distance walked: 30 ft Assistive device utilized: pt uses SPC LUE (pt aware that this is not standard technique); completed gait trial with no AD for assessment Level of assistance: SBA Comments: Lateral trunk flexion during LLE swing phase to clear foot and dec stance time on LLE; decreased unipedal stance time L>R  Posture: Lumbar lordosis: Decreased Mild inc thoracic kyphosis Iliac crest height: Equal bilaterally Lumbar lateral shift: Negative  AROM AROM (Normal range in degrees) AROM  05/07/24  Lumbar   Flexion (65) 50%*  Extension (30) 50%* (more pain with extension)  Right lateral flexion (25) 50%  Left lateral flexion (25) 50%*  Right rotation (30) WFL  Left rotation (30) WFL      Hip Right Left  Flexion (125)    Extension (15)    Abduction (40)    Adduction     Internal Rotation (45)    External Rotation (45)        (* = pain; Blank rows = not tested)  LE MMT: MMT (out of 5) Right 05/07/24 Left 05/07/24   Hip flexion 4 4-  Hip extension 4-  4-  Hip abduction    Hip adduction    Hip internal rotation    Hip external rotation    Knee flexion 4+ 4  Knee extension 5 4+  Ankle dorsiflexion 5 4-  Ankle plantarflexion    Ankle inversion    Ankle eversion    (* = pain; Blank rows = not tested)  Sensation Grossly intact to light touch throughout bilateral LEs with exception of L great toe. Proprioception, stereognosis, and hot/cold testing deferred on this date.  Reflexes R/L Bracioradialis: 2+/2+ Biceps: 1+/1+ Triceps: Unable to obtain/2+ Knee Jerk (L3/4): 3+/3+  Ankle Jerk (S1/2): 2+/2+  Clonus: Negative (2-3 beats per side) Hoffman's Sign: Negative  Muscle Length Hamstrings: R: Positive L: Positive E[place] (quadriceps): R: Not examined L: Not examined   Palpation Location Right Left         Lumbar paraspinals 1 2  Quadratus Lumborum 0 0  Gluteus Maximus 1 1  Gluteus Medius 1 1  Deep hip external rotators    PSIS    Fortin's Area (SIJ)    Greater Trochanter    (Blank rows = not tested) Graded on 0-4 scale (0 = no pain, 1 = pain, 2 = pain with wincing/grimacing/flinching, 3 = pain with withdrawal, 4 = unwilling to allow palpation)  Passive Accessory Intervertebral Motion (updated 05/09/24) Hypomobile  L3-L5; pain at restriction   Special Tests Lumbar Radiculopathy and Discogenic: Centralization and Peripheralization (SN 92, -LR 0.12): Repeated extension in lying; mild axial pain afterward, no c/o of increased pain after brief rest period (will need further assessment Slump (SN 83, -LR 0.32): R: Negative L: Negative SLR (SN 92, -LR 0.29): R: Negative L:  Negative  Facet Joint: Extension-Rotation (SN 100, -LR 0.0): R: Not examined L: Not examined  Lumbar Foraminal Stenosis: Lumbar quadrant (SN 70): R: Negative L: Positive (pain felt on R paraspinal)     TODAY'S TREATMENT: DATE: 05/16/2024   SUBJECTIVE STATEMENT:   Pt reports Lyrica is still helping with low back pain; pt using this 2x/day. Pt reports no  radiating/referred pain. Pt reports axial low back pain mainly; no pain across waist or into flank on either side. He reports significant time required to complete HEP on Med[employer] GO app. Pt reports attempted use of chair versus counter/sink for standing toe raise and falling backward due to this - non-injurious fall per pt.     Manual Therapy - for symptom modulation, soft tissue sensitivity and mobility, joint mobility, ROM   STM/IASTM with Hypervolt along longissimus lumborum L3-5 bilaterally; x 8 minutes    Therapeutic Exercise - for improved soft tissue flexibility and extensibility as needed for ROM, improved strength as needed to improve performance of CKC activities/functional movements   NuStep; Level 3, x 5 minutes - for improved soft tissue mobility and increased tissue temperature to improve muscle performance   -subjective gathered during this time  -intermittent indirect time, 3 minutes not billed   Repeated extension in lying; 1 x 10  -tension in back during, moderate axial discomfort initially after  -minimal symptoms in lying immediately after  Lower trunk rotations; 2 x 10, 2 sec hold at end-range  Bridge; 1 x 10, maintenance of neutral spine  Quadruped side bend; 1 x 10 alternating R/L  Hurdle step over single ankle weight flat on floor; 2 x 10 with either LE  Stair negotiation; up/down staircase in center of gym; x 1 with bilat handrail support, x 1 with R handrail going up   TUG: 14.6 sec 5TSTS: 16.5 sec    PATIENT EDUCATION: Discussed role of repeated movement program and expectations with repeated extension. Discussed self-pacing of HEP and ensuring performance of sets/reps per program provided. Discussed safe performance of HEP with use of kitchen sink for standing toe raises.     *not today* Standing heel raise/toe raise; light touch on adjacent chair; 1 x 10  -for HEP update   PATIENT EDUCATION:  Education details: see above for patient  education details Person educated: Patient Education method: Explanation, Demonstration, and Handouts Education comprehension: verbalized understanding   HOME EXERCISE PROGRAM:  Access Code: [email or web address] URL: [email or web address] Date: 05/09/2024 Prepared by: Venetia Endo  Exercises - Prone Press Up  - 5-6 x daily - 7 x weekly - 1 sets - 10 reps - 1sec hold - Supine Hamstring Stretch  - 2 x daily - 7 x weekly - 2 sets - 10 reps - 1sec hold - Supine Lower Trunk Rotation  - 2 x daily - 7 x weekly - 2 sets - 10 reps - Supine Bridge  - 1 x daily - 7 x weekly - 2 sets - 10 reps - Standing Toe Raises at Chair  - 2 x daily - 7 x weekly - 2 sets - 10 reps   ASSESSMENT:  CLINICAL IMPRESSION: Patient has ongoing gait deficits with notable  circumduction and contralateral trunk sidebend with LLE swing phase. He has primarily axial back pain and sensitivity along L3-5 longissimus lumborum; pt has fair response to manual therapy and reports no notable pain, but some feeling of numbness immediately afterward. We completed additional work on re-training heel to toe stepping and ensuring adequate active dorsiflexion to perform forward step. Pt has current deficits in: thoracolumbar AROM, posterior chain/hamstrings decreased flexibility, L>R lower limb weakness and apparent L5 myotomal deficit, associated gait changes, and axial pain affecting lower lumbar region. Pt will continue to benefit from skilled PT services to address deficits and improve function.   OBJECTIVE IMPAIRMENTS: Abnormal gait, decreased balance, difficulty walking, decreased ROM, decreased strength, hypomobility, impaired flexibility, postural dysfunction, and pain.   ACTIVITY LIMITATIONS: carrying, lifting, bending, sitting, standing, squatting, stairs, transfers, and locomotion level  PARTICIPATION LIMITATIONS: meal prep, cleaning, driving, and community activity  PERSONAL FACTORS: Past/current experiences, Time  since onset of injury/illness/exacerbation, and 3+ comorbidities: (HTN, OA, anxiety, depression, Hx of ACDF and cervical myelopathy)are also affecting patient's functional outcome.   REHAB POTENTIAL: Good  CLINICAL DECISION MAKING: Unstable/unpredictable  EVALUATION COMPLEXITY: High   GOALS: Goals reviewed with patient? Yes  SHORT TERM GOALS: Target date: 05/29/2024  Pt will be independent with HEP in order to improve strength and decrease back pain to improve pain-free function at home and work. Baseline: 05/07/24: Baseline HEP initiated  Goal status: INITIAL  2.  Pt will decrease worst back pain by at least 2 points on the NPRS in order to demonstrate clinically significant reduction in back pain. Baseline: 05/07/24: 10/10 pain at worst.  Goal status: INITIAL   LONG TERM GOALS: Target date: 07/05/2024  Pt will decrease mODI score by at least 13 points in order demonstrate clinically significant reduction in back pain/disability.    Baseline: 05/07/24: 32% Goal status: INITIAL  2.  Pt will decrease worst back pain to no more than 2-3/10 on the NPRS in order to demonstrate clinically significant reduction in back pain. Baseline: 05/07/24: 10/10 at worst.  Goal status: INITIAL  3.  Patient will have full thoracolumbar AROM without reproduction of pain as needed for reaching items on ground, household chores, bending.  Baseline: 05/07/24: Motion loss and pain with flexion/extension, L lateral flexion. Motion loss R lateral flexion.  Goal status: INITIAL  4.  Patient will demonstrate safe negotiation of flight of stairs with R handrail support without reproduction of pain and adequate toe clearance on each step as needed for getting to upstairs bedroom in his apartment.  Baseline: 05/07/24: Notable difficulty with stair negotiation.  Goal status: INITIAL  5.  Patient will improve L lower limb MMTs to 4+/5 or greater indicative of improved strength as needed for lower limb lifting during  self-care ADLs and adequate dorsiflexor strength for loading response during gait cycle and toe clearance during LLE swing.  Baseline: 05/07/24: MMTs 4- to 4 with exception of srong L quadriceps.  Goal status: INITIAL   PLAN: PT FREQUENCY: 1-2x/week  PT DURATION: 8 weeks  PLANNED INTERVENTIONS: Therapeutic exercises, Therapeutic activity, Neuromuscular re-education, Balance training, Gait training, Patient/Family education, Self Care, Joint mobilization, Joint manipulation, Vestibular training, Canalith repositioning, Orthotic/Fit training, DME instructions, Dry Needling, Electrical stimulation, Spinal manipulation, Spinal mobilization, Cryotherapy, Moist heat, Taping, Traction, Ultrasound, Ionotophoresis 4mg /ml Dexamethasone , Manual therapy, and Re-evaluation.  PLAN FOR NEXT SESSION: Re-assess response with extension in lying (trial of prone press up today). Continue with hamstrings dynamic stretching and progressive hip/trunk strengthening. Progress with standing pre-gait and gait training drills  as able. Further fall risk testing as indicated.    Venetia Endo, PT, DPT #[id number]  Venetia ONEIDA Endo, PT 05/16/2024, 7:24 AM

## 2024-05-22 ENCOUNTER — Encounter: Payer: Self-pay | Admitting: Physical Therapy

## 2024-05-22 ENCOUNTER — Ambulatory Visit: Attending: Family Medicine | Admitting: Physical Therapy

## 2024-05-22 DIAGNOSIS — M5459 Other low back pain: Secondary | ICD-10-CM | POA: Insufficient documentation

## 2024-05-22 DIAGNOSIS — M6281 Muscle weakness (generalized): Secondary | ICD-10-CM | POA: Insufficient documentation

## 2024-05-22 DIAGNOSIS — R42 Dizziness and giddiness: Secondary | ICD-10-CM | POA: Diagnosis present

## 2024-05-22 DIAGNOSIS — R262 Difficulty in walking, not elsewhere classified: Secondary | ICD-10-CM | POA: Diagnosis present

## 2024-05-22 NOTE — Therapy (Signed)
 OUTPATIENT PHYSICAL THERAPY THORACOLUMBAR TREATMENT   Patient Name: Tyler Winters MRN: [id number] DOB:[birth year], 58 y.o., male Today's Date: 05/22/2024   END OF SESSION:  PT End of Session - 05/22/24 0827     Visit Number 4    Number of Visits 17    Date for PT Re-Evaluation 07/05/24    PT Start Time 0820    PT Stop Time 0908    PT Time Calculation (min) 48 min    Activity Tolerance Patient limited by pain          Past Medical History:  Diagnosis Date   Allergy    Arthritis    Hypertension    Past Surgical History:  Procedure Laterality Date   COLON SURGERY     SPINE SURGERY     Patient Active Problem List   Diagnosis Date Noted   Hyperlipidemia, mixed 03/27/2024   Healthcare maintenance 03/27/2024   Lumbar radiculopathy, chronic 01/18/2024   Anxiety and depression 01/18/2024   Hypertension 01/18/2024   Left leg weakness 01/09/2021   Nicotine dependence 01/09/2021    PCP: Ziglar, Susan K, MD  REFERRING PROVIDER: Ziglar, Susan K, MD  REFERRING DIAG: M54.16 (ICD-10-CM) - Chronic left-sided lumbar radiculopathy   RATIONALE FOR EVALUATION AND TREATMENT: Rehabilitation  THERAPY DIAG: Other low back pain  Difficulty in walking, not elsewhere classified  Muscle weakness (generalized)  ONSET DATE: 2023 around time of Sx  FOLLOW-UP APPT SCHEDULED WITH REFERRING PROVIDER: Yes ; next f/u with PCP 05/08/24  PERTINENT HISTORY: Pt is a 58 year old male referred for chronic L-sided lumbar radiculopathy.  HX of DDD of the cervical spine (C4-C7, s/p allograft spine surgery), chronic myelopathy left leg (patient reports started after aforementioned C-spine surgery 06/2022). Pt had prior C4-6 ACDF in 2019 with complications requiring additional C4-7 allograft surgery. Still has weakness in his left leg, loses his balance and falls. He is walking with a cane now which helps. Pt reports some R upper limb motor control and fine motor limitation.   Pt reports some  L-sided weakness and imbalance associated with this. He states it feels like I had a stroke, but I didn't have a stroke. He reports notable pain with sustained flexion activities e.g. dishes and bathing dog. Pt refrains from heavy lifting.   He reports limitation with LLE dorsiflexing/everting. Pt reports having to use railing/wall for stairs; difficulty with ascent and descent.    PAIN:    Pain Intensity: Present: 1/10, Best: 1/10, Worst: 10/10 Pain location: Lower lumbar region Pain Quality: slow, tense; will worsen with aggravating activities; axial low back; no notable sciatic-type symptoms or LE referred symptoms Radiating: No  Numbness/Tingling: Some numbness in R 4th-5th digits, no LE N/T reported  Focal Weakness: Yes, R hand from cervical myelopathy, L-sided weakness and difficulty lifting L foot  Aggravating factors: sustained forward lean activities  Relieving factors: leaning backward (sitting or standing), reclined sitting position, lying on back with deep breathing; sit to stand; prolonged standing 24-hour pain behavior: worse with specific activities How long can you sit: How long can you stand: History of prior back injury, pain, surgery, or therapy: No Dominant hand: right; pt uses L hand more now due to R hand impairment   Imaging: Yes   IMPRESSION: 1. No acute osseous or ligamentous findings in the spine. No clear explanation for the patient's symptoms. 2. Symmetric foci of T2 hyperintensity in the cord at C5. Given proximity to the prior cervical fusion, this is likely the sequela of remote compressive  myelopathy. No cord edema or expansion. The conus medullaris and cauda equina appear normal. 3. Multilevel cervical spondylosis post anterior discectomy and fusion at C5-6. Mild spinal stenosis at C4-5. Multilevel cervical foraminal narrowing as detailed above. 4. Mild thoracic spine degenerative changes with small disc protrusions at T3-4 and T4-5. No cord  deformity or foraminal compromise. 5. Moderate-sized left paracentral disc protrusion at L4-5 with mass effect on the thecal sac and possible left L5 nerve root encroachment.   Red flags: Negative for bowel/bladder changes, saddle paresthesia, personal history of cancer, h/o spinal tumors, h/o compression fx, h/o abdominal aneurysm, abdominal pain, chills/fever, night sweats, nausea, vomiting, unrelenting pain, first onset of insidious LBP <20 y/o  PRECAUTIONS: Fall  WEIGHT BEARING RESTRICTIONS: No  FALLS: Has patient fallen in last 6 months? Yes. Number of falls 3-4 falls each month  Living Environment Lives with: lives alone; sister lives close by in town; children are in [place] Lives in: House/apartment; 2-story apartment Stairs: Yes: Internal: 14 steps; on right going up Has following equipment at home: walking stick/cane  Prior level of function: Independent  Occupational demands: Out of work due to medical issues/injuries. Pt working on getting SS income   Hobbies: Pt enjoys bowling, video games, darts, playing pool  Patient Goals: Able to walk further, able to get legs to go   OBJECTIVE:  Patient Surveys  Modified Oswestry 16/50 = 32%    GAIT: Distance walked: 30 ft Assistive device utilized: pt uses SPC LUE (pt aware that this is not standard technique); completed gait trial with no AD for assessment Level of assistance: SBA Comments: Lateral trunk flexion during LLE swing phase to clear foot and dec stance time on LLE; decreased unipedal stance time L>R  Posture: Lumbar lordosis: Decreased Mild inc thoracic kyphosis Iliac crest height: Equal bilaterally Lumbar lateral shift: Negative  AROM AROM (Normal range in degrees) AROM  05/07/24  Lumbar   Flexion (65) 50%*  Extension (30) 50%* (more pain with extension)  Right lateral flexion (25) 50%  Left lateral flexion (25) 50%*  Right rotation (30) WFL  Left rotation (30) WFL      Hip Right Left  Flexion  (125)    Extension (15)    Abduction (40)    Adduction     Internal Rotation (45)    External Rotation (45)        (* = pain; Blank rows = not tested)  LE MMT: MMT (out of 5) Right 05/07/24 Left 05/07/24   Hip flexion 4 4-  Hip extension 4- 4-  Hip abduction    Hip adduction    Hip internal rotation    Hip external rotation    Knee flexion 4+ 4  Knee extension 5 4+  Ankle dorsiflexion 5 4-  Ankle plantarflexion    Ankle inversion    Ankle eversion    (* = pain; Blank rows = not tested)  Sensation Grossly intact to light touch throughout bilateral LEs with exception of L great toe. Proprioception, stereognosis, and hot/cold testing deferred on this date.  Reflexes R/L Bracioradialis: 2+/2+ Biceps: 1+/1+ Triceps: Unable to obtain/2+ Knee Jerk (L3/4): 3+/3+  Ankle Jerk (S1/2): 2+/2+  Clonus: Negative (2-3 beats per side) Hoffman's Sign: Negative  Muscle Length Hamstrings: R: Positive L: Positive E[place] (quadriceps): R: Not examined L: Not examined   Palpation Location Right Left         Lumbar paraspinals 1 2  Quadratus Lumborum 0 0  Gluteus Maximus 1 1  Gluteus  Medius 1 1  Deep hip external rotators    PSIS    Fortin's Area (SIJ)    Greater Trochanter    (Blank rows = not tested) Graded on 0-4 scale (0 = no pain, 1 = pain, 2 = pain with wincing/grimacing/flinching, 3 = pain with withdrawal, 4 = unwilling to allow palpation)  Passive Accessory Intervertebral Motion (updated 05/09/24) Hypomobile L3-L5; pain at restriction   Special Tests Lumbar Radiculopathy and Discogenic: Centralization and Peripheralization (SN 92, -LR 0.12): Repeated extension in lying; mild axial pain afterward, no c/o of increased pain after brief rest period (will need further assessment Slump (SN 83, -LR 0.32): R: Negative L: Negative SLR (SN 92, -LR 0.29): R: Negative L:  Negative  Facet Joint: Extension-Rotation (SN 100, -LR 0.0): R: Not examined L: Not examined  Lumbar Foraminal  Stenosis: Lumbar quadrant (SN 70): R: Negative L: Positive (pain felt on R paraspinal)     TODAY'S TREATMENT: DATE: 05/22/2024   SUBJECTIVE STATEMENT:   Pt reports Lyrica  is still helping with low back pain; pt using this 2x/day. 4/10 axial low back pain at arrival to PT. Patient reports N/T in fingers; no lower body numbness/paresthesias.     Manual Therapy - for symptom modulation, soft tissue sensitivity and mobility, joint mobility, ROM   STM along longissimus lumborum L3-5 bilaterally; x 10 minutes CPA produces pain prior to restriction at L3-5, stopped CPA after initial 30 sec trial   MHP (unbilled) utilized following manual therapy for analgesic effect and improved soft tissue extensibility; along low back x 5 minutes.    Therapeutic Exercise - for improved soft tissue flexibility and extensibility as needed for ROM, improved strength as needed to improve performance of CKC activities/functional movements   NuStep; Level 3, x 5 minutes - for improved soft tissue mobility and increased tissue temperature to improve muscle performance   -subjective gathered during this time  -intermittent indirect time, 4 minutes not billed (indirect time)  Repeated extension in lying; 1 x 10  -tension in back during, moderate axial discomfort initially after  -minimal symptoms in lying immediately after  Lower trunk rotations; 2 x 10, 2 sec hold at end-range  Extension with forearms on wall; 1 x 10   *not today* Quadruped side bend; 1 x 10 alternating R/L    Therapeutic Activities - exercise to promote improved foot clearance during gait and improved stepping pattern   Toe tap on 12-inch step; staircase center of gym; 2 x 10 alternating R/L  PATIENT EDUCATION: Discussed modification to repeated movement program and role of PT moving forward. Discussed use of toe tap activity with step stool or box at home to re-train toe clearance and ankle dorsiflexion with swing phase.  Discussed use of drop foot brace if pt continues to drag L foot in spite of working on exercise/movement control.    *not today* Hurdle step over single ankle weight flat on floor; 2 x 10 with either LE Bridge; 1 x 10, maintenance of neutral spine Stair negotiation; up/down staircase in center of gym; x 1 with bilat handrail support, x 1 with R handrail going up Standing heel raise/toe raise; light touch on adjacent chair; 1 x 10  -for HEP update   PATIENT EDUCATION:  Education details: see above for patient education details Person educated: Patient Education method: Explanation, Demonstration, and Handouts Education comprehension: verbalized understanding   HOME EXERCISE PROGRAM:  Access Code: [email or web address] URL: [email or web address] Date: 05/09/2024 Prepared by: Venetia Endo  Exercises - Prone Press  Up  - 5-6 x daily - 7 x weekly - 1 sets - 10 reps - 1sec hold - Supine Hamstring Stretch  - 2 x daily - 7 x weekly - 2 sets - 10 reps - 1sec hold - Supine Lower Trunk Rotation  - 2 x daily - 7 x weekly - 2 sets - 10 reps - Supine Bridge  - 1 x daily - 7 x weekly - 2 sets - 10 reps - Standing Toe Raises at Chair  - 2 x daily - 7 x weekly - 2 sets - 10 reps   ASSESSMENT:  CLINICAL IMPRESSION: Patient had fall last week and didn't attend PT due to comorbid wrist pain due to [facility]; this condition is improving and pt is tolerating wrist AROM well. Pt tolerates manual therapy well and is able to modestly progress with standing motor control training to promote foot clearance with stepping. We discussed alternatives for repeated extension to improve tolerance and compliance. Pt has current deficits in: thoracolumbar AROM, posterior chain/hamstrings decreased flexibility, L>R lower limb weakness and apparent L5 myotomal deficit, associated gait changes, and axial pain affecting lower lumbar region. Pt will continue to benefit from skilled PT services to address deficits and  improve function.   OBJECTIVE IMPAIRMENTS: Abnormal gait, decreased balance, difficulty walking, decreased ROM, decreased strength, hypomobility, impaired flexibility, postural dysfunction, and pain.   ACTIVITY LIMITATIONS: carrying, lifting, bending, sitting, standing, squatting, stairs, transfers, and locomotion level  PARTICIPATION LIMITATIONS: meal prep, cleaning, driving, and community activity  PERSONAL FACTORS: Past/current experiences, Time since onset of injury/illness/exacerbation, and 3+ comorbidities: (HTN, OA, anxiety, depression, Hx of ACDF and cervical myelopathy)are also affecting patient's functional outcome.   REHAB POTENTIAL: Good  CLINICAL DECISION MAKING: Unstable/unpredictable  EVALUATION COMPLEXITY: High   GOALS: Goals reviewed with patient? Yes  SHORT TERM GOALS: Target date: 05/29/2024  Pt will be independent with HEP in order to improve strength and decrease back pain to improve pain-free function at home and work. Baseline: 05/07/24: Baseline HEP initiated  Goal status: INITIAL  2.  Pt will decrease worst back pain by at least 2 points on the NPRS in order to demonstrate clinically significant reduction in back pain. Baseline: 05/07/24: 10/10 pain at worst.  Goal status: INITIAL   LONG TERM GOALS: Target date: 07/05/2024  Pt will decrease mODI score by at least 13 points in order demonstrate clinically significant reduction in back pain/disability.    Baseline: 05/07/24: 32% Goal status: INITIAL  2.  Pt will decrease worst back pain to no more than 2-3/10 on the NPRS in order to demonstrate clinically significant reduction in back pain. Baseline: 05/07/24: 10/10 at worst.  Goal status: INITIAL  3.  Patient will have full thoracolumbar AROM without reproduction of pain as needed for reaching items on ground, household chores, bending.  Baseline: 05/07/24: Motion loss and pain with flexion/extension, L lateral flexion. Motion loss R lateral flexion.  Goal  status: INITIAL  4.  Patient will demonstrate safe negotiation of flight of stairs with R handrail support without reproduction of pain and adequate toe clearance on each step as needed for getting to upstairs bedroom in his apartment.  Baseline: 05/07/24: Notable difficulty with stair negotiation.  Goal status: INITIAL  5.  Patient will improve L lower limb MMTs to 4+/5 or greater indicative of improved strength as needed for lower limb lifting during self-care ADLs and adequate dorsiflexor strength for loading response during gait cycle and toe clearance during LLE swing.  Baseline: 05/07/24: MMTs  4- to 4 with exception of srong L quadriceps.  Goal status: INITIAL   PLAN: PT FREQUENCY: 1-2x/week  PT DURATION: 8 weeks  PLANNED INTERVENTIONS: Therapeutic exercises, Therapeutic activity, Neuromuscular re-education, Balance training, Gait training, Patient/Family education, Self Care, Joint mobilization, Joint manipulation, Vestibular training, Canalith repositioning, Orthotic/Fit training, DME instructions, Dry Needling, Electrical stimulation, Spinal manipulation, Spinal mobilization, Cryotherapy, Moist heat, Taping, Traction, Ultrasound, Ionotophoresis 4mg /ml Dexamethasone , Manual therapy, and Re-evaluation.  PLAN FOR NEXT SESSION: Re-assess response with extension in lying (trial of prone press up today). Continue with hamstrings dynamic stretching and progressive hip/trunk strengthening. Progress with standing pre-gait and gait training drills as able. Further fall risk testing as indicated.    Venetia Endo, PT, DPT #[id number]  Venetia ONEIDA Endo, PT 05/22/2024, 9:13 AM

## 2024-05-24 ENCOUNTER — Ambulatory Visit: Admitting: Physical Therapy

## 2024-05-24 ENCOUNTER — Encounter: Payer: Self-pay | Admitting: Physical Therapy

## 2024-05-24 DIAGNOSIS — M5459 Other low back pain: Secondary | ICD-10-CM

## 2024-05-24 DIAGNOSIS — R262 Difficulty in walking, not elsewhere classified: Secondary | ICD-10-CM

## 2024-05-24 DIAGNOSIS — M6281 Muscle weakness (generalized): Secondary | ICD-10-CM

## 2024-05-24 NOTE — Therapy (Signed)
 OUTPATIENT PHYSICAL THERAPY THORACOLUMBAR TREATMENT   Patient Name: Tyler Winters MRN: [id number] DOB:10-Feb-1966, 58 y.o., male Today's Date: 05/24/2024   END OF SESSION:  PT End of Session - 05/24/24 0814     Visit Number 5    Number of Visits 17    Date for PT Re-Evaluation 07/05/24    PT Start Time 0814    PT Stop Time 0857    PT Time Calculation (min) 43 min    Activity Tolerance Patient limited by pain           Past Medical History:  Diagnosis Date   Allergy    Arthritis    Hypertension    Past Surgical History:  Procedure Laterality Date   COLON SURGERY     SPINE SURGERY     Patient Active Problem List   Diagnosis Date Noted   Hyperlipidemia, mixed 03/27/2024   Healthcare maintenance 03/27/2024   Lumbar radiculopathy, chronic 01/18/2024   Anxiety and depression 01/18/2024   Hypertension 01/18/2024   Left leg weakness 01/09/2021   Nicotine dependence 01/09/2021    PCP: Ziglar, Susan K, MD  REFERRING PROVIDER: Ziglar, Susan K, MD  REFERRING DIAG: M54.16 (ICD-10-CM) - Chronic left-sided lumbar radiculopathy   RATIONALE FOR EVALUATION AND TREATMENT: Rehabilitation  THERAPY DIAG: Other low back pain  Difficulty in walking, not elsewhere classified  Muscle weakness (generalized)  ONSET DATE: 2023 around time of Sx  FOLLOW-UP APPT SCHEDULED WITH REFERRING PROVIDER: Yes ; next f/u with PCP 05/08/24  PERTINENT HISTORY: Pt is a 58 year old male referred for chronic L-sided lumbar radiculopathy.  HX of DDD of the cervical spine (C4-C7, s/p allograft spine surgery), chronic myelopathy left leg (patient reports started after aforementioned C-spine surgery 06/2022). Pt had prior C4-6 ACDF in 2019 with complications requiring additional C4-7 allograft surgery. Still has weakness in his left leg, loses his balance and falls. He is walking with a cane now which helps. Pt reports some R upper limb motor control and fine motor limitation.   Pt reports some  L-sided weakness and imbalance associated with this. He states it feels like I had a stroke, but I didn't have a stroke. He reports notable pain with sustained flexion activities e.g. dishes and bathing dog. Pt refrains from heavy lifting.   He reports limitation with LLE dorsiflexing/everting. Pt reports having to use railing/wall for stairs; difficulty with ascent and descent.    PAIN:    Pain Intensity: Present: 1/10, Best: 1/10, Worst: 10/10 Pain location: Lower lumbar region Pain Quality: slow, tense; will worsen with aggravating activities; axial low back; no notable sciatic-type symptoms or LE referred symptoms Radiating: No  Numbness/Tingling: Some numbness in R 4th-5th digits, no LE N/T reported  Focal Weakness: Yes, R hand from cervical myelopathy, L-sided weakness and difficulty lifting L foot  Aggravating factors: sustained forward lean activities  Relieving factors: leaning backward (sitting or standing), reclined sitting position, lying on back with deep breathing; sit to stand; prolonged standing 24-hour pain behavior: worse with specific activities How long can you sit: How long can you stand: History of prior back injury, pain, surgery, or therapy: No Dominant hand: right; pt uses L hand more now due to R hand impairment   Imaging: Yes   IMPRESSION: 1. No acute osseous or ligamentous findings in the spine. No clear explanation for the patient's symptoms. 2. Symmetric foci of T2 hyperintensity in the cord at C5. Given proximity to the prior cervical fusion, this is likely the sequela of remote  compressive myelopathy. No cord edema or expansion. The conus medullaris and cauda equina appear normal. 3. Multilevel cervical spondylosis post anterior discectomy and fusion at C5-6. Mild spinal stenosis at C4-5. Multilevel cervical foraminal narrowing as detailed above. 4. Mild thoracic spine degenerative changes with small disc protrusions at T3-4 and T4-5. No cord  deformity or foraminal compromise. 5. Moderate-sized left paracentral disc protrusion at L4-5 with mass effect on the thecal sac and possible left L5 nerve root encroachment.   Red flags: Negative for bowel/bladder changes, saddle paresthesia, personal history of cancer, h/o spinal tumors, h/o compression fx, h/o abdominal aneurysm, abdominal pain, chills/fever, night sweats, nausea, vomiting, unrelenting pain, first onset of insidious LBP <20 y/o  PRECAUTIONS: Fall  WEIGHT BEARING RESTRICTIONS: No  FALLS: Has patient fallen in last 6 months? Yes. Number of falls 3-4 falls each month  Living Environment Lives with: lives alone; sister lives close by in town; children are in [place] Lives in: House/apartment; 2-story apartment Stairs: Yes: Internal: 14 steps; on right going up Has following equipment at home: walking stick/cane  Prior level of function: Independent  Occupational demands: Out of work due to medical issues/injuries. Pt working on getting SS income   Hobbies: Pt enjoys bowling, video games, darts, playing pool  Patient Goals: Able to walk further, able to get legs to go   OBJECTIVE:  Patient Surveys  Modified Oswestry 16/50 = 32%    GAIT: Distance walked: 30 ft Assistive device utilized: pt uses SPC LUE (pt aware that this is not standard technique); completed gait trial with no AD for assessment Level of assistance: SBA Comments: Lateral trunk flexion during LLE swing phase to clear foot and dec stance time on LLE; decreased unipedal stance time L>R  Posture: Lumbar lordosis: Decreased Mild inc thoracic kyphosis Iliac crest height: Equal bilaterally Lumbar lateral shift: Negative  AROM AROM (Normal range in degrees) AROM  05/07/24  Lumbar   Flexion (65) 50%*  Extension (30) 50%* (more pain with extension)  Right lateral flexion (25) 50%  Left lateral flexion (25) 50%*  Right rotation (30) WFL  Left rotation (30) WFL      Hip Right Left  Flexion  (125)    Extension (15)    Abduction (40)    Adduction     Internal Rotation (45)    External Rotation (45)        (* = pain; Blank rows = not tested)  LE MMT: MMT (out of 5) Right 05/07/24 Left 05/07/24   Hip flexion 4 4-  Hip extension 4- 4-  Hip abduction    Hip adduction    Hip internal rotation    Hip external rotation    Knee flexion 4+ 4  Knee extension 5 4+  Ankle dorsiflexion 5 4-  Ankle plantarflexion    Ankle inversion    Ankle eversion    (* = pain; Blank rows = not tested)  Sensation Grossly intact to light touch throughout bilateral LEs with exception of L great toe. Proprioception, stereognosis, and hot/cold testing deferred on this date.  Reflexes R/L Bracioradialis: 2+/2+ Biceps: 1+/1+ Triceps: Unable to obtain/2+ Knee Jerk (L3/4): 3+/3+  Ankle Jerk (S1/2): 2+/2+  Clonus: Negative (2-3 beats per side) Hoffman's Sign: Negative  Muscle Length Hamstrings: R: Positive L: Positive E[place] (quadriceps): R: Not examined L: Not examined   Palpation Location Right Left         Lumbar paraspinals 1 2  Quadratus Lumborum 0 0  Gluteus Maximus 1 1  Gluteus Medius 1 1  Deep hip external rotators    PSIS    Fortin's Area (SIJ)    Greater Trochanter    (Blank rows = not tested) Graded on 0-4 scale (0 = no pain, 1 = pain, 2 = pain with wincing/grimacing/flinching, 3 = pain with withdrawal, 4 = unwilling to allow palpation)  Passive Accessory Intervertebral Motion (updated 05/09/24) Hypomobile L3-L5; pain at restriction   Special Tests Lumbar Radiculopathy and Discogenic: Centralization and Peripheralization (SN 92, -LR 0.12): Repeated extension in lying; mild axial pain afterward, no c/o of increased pain after brief rest period (will need further assessment Slump (SN 83, -LR 0.32): R: Negative L: Negative SLR (SN 92, -LR 0.29): R: Negative L:  Negative  Facet Joint: Extension-Rotation (SN 100, -LR 0.0): R: Not examined L: Not examined  Lumbar Foraminal  Stenosis: Lumbar quadrant (SN 70): R: Negative L: Positive (pain felt on R paraspinal)     TODAY'S TREATMENT: DATE: 05/24/2024   SUBJECTIVE STATEMENT:   Pt reports 5/10 pain at arrival. He reports notable pain this AM. He reports doing okay with updated home program/modification to extension drill. He voices concern with oscillating L ankle when he is putting his shoe on and toe is pointed with forefoot planted on floor.     Manual Therapy - for symptom modulation, soft tissue sensitivity and mobility, joint mobility, ROM   STM along longissimus lumborum L3-5 bilaterally; x 12 minutes   Therapeutic Exercise - for improved soft tissue flexibility and extensibility as needed for ROM, improved strength as needed to improve performance of CKC activities/functional movements   NuStep; Level 4, x 6 minutes - for improved soft tissue mobility and increased tissue temperature to improve muscle performance   -subjective gathered during this time  -intermittent indirect time, 2 minutes not billed (indirect time)  Repeated extension in lying; 1 x 10  -tension in back during, moderate axial discomfort initially after  -minimal symptoms in lying immediately after  Lower trunk rotations; 2 x 10, 2 sec hold at end-range  Dying bug; hooklying for starting position; 2 x 10, alt  Quadruped side bend; 1 x 10 alternating R/L    *not today* Extension with forearms on wall; 1 x 10   Therapeutic Activities - exercise to promote improved foot clearance during gait and improved stepping pattern   Toe tap on 12-inch step; staircase center of gym; 2 x 10 alternating R/L  PATIENT EDUCATION: Discussed role of repeated movement and clonus in setting of UMN lesion in cervical spine prior to his Sx.    *not today* Hurdle step over single ankle weight flat on floor; 2 x 10 with either LE Bridge; 1 x 10, maintenance of neutral spine Stair negotiation; up/down staircase in center of gym; x 1 with  bilat handrail support, x 1 with R handrail going up Standing heel raise/toe raise; light touch on adjacent chair; 1 x 10  -for HEP update   PATIENT EDUCATION:  Education details: see above for patient education details Person educated: Patient Education method: Explanation, Demonstration, and Handouts Education comprehension: verbalized understanding   HOME EXERCISE PROGRAM:  Access Code: [email or web address] URL: [email or web address] Date: 05/09/2024 Prepared by: Venetia Endo  Exercises - Prone Press Up  - 5-6 x daily - 7 x weekly - 1 sets - 10 reps - 1sec hold - Supine Hamstring Stretch  - 2 x daily - 7 x weekly - 2 sets - 10 reps - 1sec hold - Supine Lower Trunk Rotation  -  2 x daily - 7 x weekly - 2 sets - 10 reps - Supine Bridge  - 1 x daily - 7 x weekly - 2 sets - 10 reps - Standing Toe Raises at Chair  - 2 x daily - 7 x weekly - 2 sets - 10 reps   ASSESSMENT:  CLINICAL IMPRESSION: Patient is doing fairly well with repeated extension modification and HEP; he has not yet worked on toe tapping drill at home. We discussed working on this to re-train ankle dorsiflexion during swing phase of LLE. Pt is concerned with oscillation of L foot during closed-chain dorsiflexion while pt is weightbearing on forefoot while donning shoe - we discussed Clonus sign consistent with Hx of cervical myelopathy. Clonus is only 4-5 beats in supine, but more significant in weightbearing. Pt tolerates lateral flexion in unloaded position of spine well. Pt has current deficits in: thoracolumbar AROM, posterior chain/hamstrings decreased flexibility, L>R lower limb weakness and apparent L5 myotomal deficit, associated gait changes, and axial pain affecting lower lumbar region. Pt will continue to benefit from skilled PT services to address deficits and improve function.   OBJECTIVE IMPAIRMENTS: Abnormal gait, decreased balance, difficulty walking, decreased ROM, decreased strength, hypomobility,  impaired flexibility, postural dysfunction, and pain.   ACTIVITY LIMITATIONS: carrying, lifting, bending, sitting, standing, squatting, stairs, transfers, and locomotion level  PARTICIPATION LIMITATIONS: meal prep, cleaning, driving, and community activity  PERSONAL FACTORS: Past/current experiences, Time since onset of injury/illness/exacerbation, and 3+ comorbidities: (HTN, OA, anxiety, depression, Hx of ACDF and cervical myelopathy)are also affecting patient's functional outcome.   REHAB POTENTIAL: Good  CLINICAL DECISION MAKING: Unstable/unpredictable  EVALUATION COMPLEXITY: High   GOALS: Goals reviewed with patient? Yes  SHORT TERM GOALS: Target date: 05/29/2024  Pt will be independent with HEP in order to improve strength and decrease back pain to improve pain-free function at home and work. Baseline: 05/07/24: Baseline HEP initiated  Goal status: INITIAL  2.  Pt will decrease worst back pain by at least 2 points on the NPRS in order to demonstrate clinically significant reduction in back pain. Baseline: 05/07/24: 10/10 pain at worst.  Goal status: INITIAL   LONG TERM GOALS: Target date: 07/05/2024  Pt will decrease mODI score by at least 13 points in order demonstrate clinically significant reduction in back pain/disability.    Baseline: 05/07/24: 32% Goal status: INITIAL  2.  Pt will decrease worst back pain to no more than 2-3/10 on the NPRS in order to demonstrate clinically significant reduction in back pain. Baseline: 05/07/24: 10/10 at worst.  Goal status: INITIAL  3.  Patient will have full thoracolumbar AROM without reproduction of pain as needed for reaching items on ground, household chores, bending.  Baseline: 05/07/24: Motion loss and pain with flexion/extension, L lateral flexion. Motion loss R lateral flexion.  Goal status: INITIAL  4.  Patient will demonstrate safe negotiation of flight of stairs with R handrail support without reproduction of pain and  adequate toe clearance on each step as needed for getting to upstairs bedroom in his apartment.  Baseline: 05/07/24: Notable difficulty with stair negotiation.  Goal status: INITIAL  5.  Patient will improve L lower limb MMTs to 4+/5 or greater indicative of improved strength as needed for lower limb lifting during self-care ADLs and adequate dorsiflexor strength for loading response during gait cycle and toe clearance during LLE swing.  Baseline: 05/07/24: MMTs 4- to 4 with exception of srong L quadriceps.  Goal status: INITIAL   PLAN: PT FREQUENCY: 1-2x/week  PT DURATION: 8 weeks  PLANNED INTERVENTIONS: Therapeutic exercises, Therapeutic activity, Neuromuscular re-education, Balance training, Gait training, Patient/Family education, Self Care, Joint mobilization, Joint manipulation, Vestibular training, Canalith repositioning, Orthotic/Fit training, DME instructions, Dry Needling, Electrical stimulation, Spinal manipulation, Spinal mobilization, Cryotherapy, Moist heat, Taping, Traction, Ultrasound, Ionotophoresis 4mg /ml Dexamethasone , Manual therapy, and Re-evaluation.  PLAN FOR NEXT SESSION: Re-assess response with extension in lying (trial of prone press up today). Continue with hamstrings dynamic stretching and progressive hip/trunk strengthening. Progress with standing pre-gait and gait training drills as able. Further fall risk testing as indicated.    Venetia Endo, PT, DPT #[id number]  Venetia ONEIDA Endo, PT 05/24/2024, 8:14 AM

## 2024-05-29 ENCOUNTER — Ambulatory Visit: Admitting: Physical Therapy

## 2024-05-29 DIAGNOSIS — M5459 Other low back pain: Secondary | ICD-10-CM | POA: Diagnosis not present

## 2024-05-29 DIAGNOSIS — M6281 Muscle weakness (generalized): Secondary | ICD-10-CM

## 2024-05-29 DIAGNOSIS — R262 Difficulty in walking, not elsewhere classified: Secondary | ICD-10-CM

## 2024-05-29 NOTE — Therapy (Unsigned)
 OUTPATIENT PHYSICAL THERAPY THORACOLUMBAR TREATMENT   Patient Name: Tyler Winters MRN: [id number] DOB:10/24/1966, 58 y.o., male Today's Date: 05/29/2024   END OF SESSION:  PT End of Session - 05/29/24 1040     Visit Number 6    Number of Visits 17    Date for PT Re-Evaluation 07/05/24    PT Start Time 1028    PT Stop Time 1114    PT Time Calculation (min) 46 min    Activity Tolerance Patient limited by pain            Past Medical History:  Diagnosis Date   Allergy    Arthritis    Hypertension    Past Surgical History:  Procedure Laterality Date   COLON SURGERY     SPINE SURGERY     Patient Active Problem List   Diagnosis Date Noted   Hyperlipidemia, mixed 03/27/2024   Healthcare maintenance 03/27/2024   Lumbar radiculopathy, chronic 01/18/2024   Anxiety and depression 01/18/2024   Hypertension 01/18/2024   Left leg weakness 01/09/2021   Nicotine dependence 01/09/2021    PCP: Ziglar, Susan K, MD  REFERRING PROVIDER: Ziglar, Susan K, MD  REFERRING DIAG: M54.16 (ICD-10-CM) - Chronic left-sided lumbar radiculopathy   RATIONALE FOR EVALUATION AND TREATMENT: Rehabilitation  THERAPY DIAG: Other low back pain  Difficulty in walking, not elsewhere classified  Muscle weakness (generalized)  ONSET DATE: 2023 around time of Sx  FOLLOW-UP APPT SCHEDULED WITH REFERRING PROVIDER: Yes ; next f/u with PCP 05/08/24  PERTINENT HISTORY: Pt is a 58 year old male referred for chronic L-sided lumbar radiculopathy.  HX of DDD of the cervical spine (C4-C7, s/p allograft spine surgery), chronic myelopathy left leg (patient reports started after aforementioned C-spine surgery 06/2022). Pt had prior C4-6 ACDF in 2019 with complications requiring additional C4-7 allograft surgery. Still has weakness in his left leg, loses his balance and falls. He is walking with a cane now which helps. Pt reports some R upper limb motor control and fine motor limitation.   Pt reports  some L-sided weakness and imbalance associated with this. He states it feels like I had a stroke, but I didn't have a stroke. He reports notable pain with sustained flexion activities e.g. dishes and bathing dog. Pt refrains from heavy lifting.   He reports limitation with LLE dorsiflexing/everting. Pt reports having to use railing/wall for stairs; difficulty with ascent and descent.    PAIN:    Pain Intensity: Present: 1/10, Best: 1/10, Worst: 10/10 Pain location: Lower lumbar region Pain Quality: slow, tense; will worsen with aggravating activities; axial low back; no notable sciatic-type symptoms or LE referred symptoms Radiating: No  Numbness/Tingling: Some numbness in R 4th-5th digits, no LE N/T reported  Focal Weakness: Yes, R hand from cervical myelopathy, L-sided weakness and difficulty lifting L foot  Aggravating factors: sustained forward lean activities  Relieving factors: leaning backward (sitting or standing), reclined sitting position, lying on back with deep breathing; sit to stand; prolonged standing 24-hour pain behavior: worse with specific activities How long can you sit: How long can you stand: History of prior back injury, pain, surgery, or therapy: No Dominant hand: right; pt uses L hand more now due to R hand impairment   Imaging: Yes   IMPRESSION: 1. No acute osseous or ligamentous findings in the spine. No clear explanation for the patient's symptoms. 2. Symmetric foci of T2 hyperintensity in the cord at C5. Given proximity to the prior cervical fusion, this is likely the sequela of  remote compressive myelopathy. No cord edema or expansion. The conus medullaris and cauda equina appear normal. 3. Multilevel cervical spondylosis post anterior discectomy and fusion at C5-6. Mild spinal stenosis at C4-5. Multilevel cervical foraminal narrowing as detailed above. 4. Mild thoracic spine degenerative changes with small disc protrusions at T3-4 and T4-5. No cord  deformity or foraminal compromise. 5. Moderate-sized left paracentral disc protrusion at L4-5 with mass effect on the thecal sac and possible left L5 nerve root encroachment.   Red flags: Negative for bowel/bladder changes, saddle paresthesia, personal history of cancer, h/o spinal tumors, h/o compression fx, h/o abdominal aneurysm, abdominal pain, chills/fever, night sweats, nausea, vomiting, unrelenting pain, first onset of insidious LBP <20 y/o  PRECAUTIONS: Fall  WEIGHT BEARING RESTRICTIONS: No  FALLS: Has patient fallen in last 6 months? Yes. Number of falls 3-4 falls each month  Living Environment Lives with: lives alone; sister lives close by in town; children are in [place] Lives in: House/apartment; 2-story apartment Stairs: Yes: Internal: 14 steps; on right going up Has following equipment at home: walking stick/cane  Prior level of function: Independent  Occupational demands: Out of work due to medical issues/injuries. Pt working on getting SS income   Hobbies: Pt enjoys bowling, video games, darts, playing pool  Patient Goals: Able to walk further, able to get legs to go   OBJECTIVE:  Patient Surveys  Modified Oswestry 16/50 = 32%    GAIT: Distance walked: 30 ft Assistive device utilized: pt uses SPC LUE (pt aware that this is not standard technique); completed gait trial with no AD for assessment Level of assistance: SBA Comments: Lateral trunk flexion during LLE swing phase to clear foot and dec stance time on LLE; decreased unipedal stance time L>R  Posture: Lumbar lordosis: Decreased Mild inc thoracic kyphosis Iliac crest height: Equal bilaterally Lumbar lateral shift: Negative  AROM AROM (Normal range in degrees) AROM  05/07/24  Lumbar   Flexion (65) 50%*  Extension (30) 50%* (more pain with extension)  Right lateral flexion (25) 50%  Left lateral flexion (25) 50%*  Right rotation (30) WFL  Left rotation (30) WFL      Hip Right Left  Flexion  (125)    Extension (15)    Abduction (40)    Adduction     Internal Rotation (45)    External Rotation (45)        (* = pain; Blank rows = not tested)  LE MMT: MMT (out of 5) Right 05/07/24 Left 05/07/24   Hip flexion 4 4-  Hip extension 4- 4-  Hip abduction    Hip adduction    Hip internal rotation    Hip external rotation    Knee flexion 4+ 4  Knee extension 5 4+  Ankle dorsiflexion 5 4-  Ankle plantarflexion    Ankle inversion    Ankle eversion    (* = pain; Blank rows = not tested)  Sensation Grossly intact to light touch throughout bilateral LEs with exception of L great toe. Proprioception, stereognosis, and hot/cold testing deferred on this date.  Reflexes R/L Bracioradialis: 2+/2+ Biceps: 1+/1+ Triceps: Unable to obtain/2+ Knee Jerk (L3/4): 3+/3+  Ankle Jerk (S1/2): 2+/2+  Clonus: Negative (2-3 beats per side) Hoffman's Sign: Negative  Muscle Length Hamstrings: R: Positive L: Positive E[place] (quadriceps): R: Not examined L: Not examined   Palpation Location Right Left         Lumbar paraspinals 1 2  Quadratus Lumborum 0 0  Gluteus Maximus 1 1  Gluteus Medius 1 1  Deep hip external rotators    PSIS    Fortin's Area (SIJ)    Greater Trochanter    (Blank rows = not tested) Graded on 0-4 scale (0 = no pain, 1 = pain, 2 = pain with wincing/grimacing/flinching, 3 = pain with withdrawal, 4 = unwilling to allow palpation)  Passive Accessory Intervertebral Motion (updated 05/09/24) Hypomobile L3-L5; pain at restriction   Special Tests Lumbar Radiculopathy and Discogenic: Centralization and Peripheralization (SN 92, -LR 0.12): Repeated extension in lying; mild axial pain afterward, no c/o of increased pain after brief rest period (will need further assessment Slump (SN 83, -LR 0.32): R: Negative L: Negative SLR (SN 92, -LR 0.29): R: Negative L:  Negative  Facet Joint: Extension-Rotation (SN 100, -LR 0.0): R: Not examined L: Not examined  Lumbar Foraminal  Stenosis: Lumbar quadrant (SN 70): R: Negative L: Positive (pain felt on R paraspinal)     TODAY'S TREATMENT: DATE: 05/29/2024   SUBJECTIVE STATEMENT:   Pt reports having notable pain over this previous week, and he wasn't sure it if could be due to his bed. He reports minor soreness at arrival this AM. He reports improving tolerance of pushing through his L hand at this time. Pt reports intermittent dizziness when moving quickly e.g. getting up from chair; he reports drunken feeling intermittently. He reports sometimes feeling like he might pass out.     Manual Therapy - for symptom modulation, soft tissue sensitivity and mobility, joint mobility, ROM   STM along iliocostalis and longissimus lumborum L2-5 bilaterally; x 15 minutes   Therapeutic Exercise - for improved soft tissue flexibility and extensibility as needed for ROM, improved strength as needed to improve performance of CKC activities/functional movements   NuStep; Level 4, x 6 minutes - for improved soft tissue mobility and increased tissue temperature to improve muscle performance   -subjective gathered during this time  -intermittent indirect time, 2 minutes not billed (indirect time)  Repeated extension in lying; 1 x 10  -tension in back during, improved tension in back after  Quadruped side bend; 1 x 10 alternating R/L  Lower trunk rotations; 1 x 10, 2 sec hold at end-range  Dying bug; hooklying for starting position; 1 x 10, alt    *not today* Extension with forearms on wall; 1 x 10   Therapeutic Activities - exercise to promote improved foot clearance during gait and improved stepping pattern   Hurdle stepping; 3 6-inch steps; 4x D/B completed in open environment (no // bars)  PATIENT EDUCATION: Discussed role of repeated movement and clonus in setting of UMN lesion in cervical spine prior to his Sx. Discussed potential contributors to dizziness and possible referral to specialist if it does not  improve.    *not today* Toe tap on 12-inch step; staircase center of gym; 2 x 10 alternating R/L Hurdle step over single ankle weight flat on floor; 2 x 10 with either LE Bridge; 1 x 10, maintenance of neutral spine Stair negotiation; up/down staircase in center of gym; x 1 with bilat handrail support, x 1 with R handrail going up Standing heel raise/toe raise; light touch on adjacent chair; 1 x 10  -for HEP update   PATIENT EDUCATION:  Education details: see above for patient education details Person educated: Patient Education method: Explanation, Demonstration, and Handouts Education comprehension: verbalized understanding   HOME EXERCISE PROGRAM:  Access Code: [email or web address] URL: [email or web address] Date: 05/09/2024 Prepared by: Venetia Endo  Exercises - Prone Press Up  -  5-6 x daily - 7 x weekly - 1 sets - 10 reps - 1sec hold - Supine Hamstring Stretch  - 2 x daily - 7 x weekly - 2 sets - 10 reps - 1sec hold - Supine Lower Trunk Rotation  - 2 x daily - 7 x weekly - 2 sets - 10 reps - Supine Bridge  - 1 x daily - 7 x weekly - 2 sets - 10 reps - Standing Toe Raises at Chair  - 2 x daily - 7 x weekly - 2 sets - 10 reps   ASSESSMENT:  CLINICAL IMPRESSION: Patient has ongoing axial back pain. We are continuing work on manual therapy, extension, and trunk stabilization as well as movement control re-training for L ankle dorsiflexion and foot clearance while swinging LLE. Pt does have alleviation of back pain in lying following repeated extension today - we had to use modifications over the previous week due to low-grade wrist sprain this past week from fall. Wrist pain fortunately is self-limiting to date; pt does have some remaining discomfort along L dorsal thenar region. Pt has comorbid dizziness with rapid change in position that could stem from orthostatic hypotension versus vertigo in setting of diuretic medication use. We discussed potential referral to other  providers if that condition does not improve.  Pt has current deficits in: thoracolumbar AROM, posterior chain/hamstrings decreased flexibility, L>R lower limb weakness and apparent L5 myotomal deficit, associated gait changes, and axial pain affecting lower lumbar region. Pt will continue to benefit from skilled PT services to address deficits and improve function.   OBJECTIVE IMPAIRMENTS: Abnormal gait, decreased balance, difficulty walking, decreased ROM, decreased strength, hypomobility, impaired flexibility, postural dysfunction, and pain.   ACTIVITY LIMITATIONS: carrying, lifting, bending, sitting, standing, squatting, stairs, transfers, and locomotion level  PARTICIPATION LIMITATIONS: meal prep, cleaning, driving, and community activity  PERSONAL FACTORS: Past/current experiences, Time since onset of injury/illness/exacerbation, and 3+ comorbidities: (HTN, OA, anxiety, depression, Hx of ACDF and cervical myelopathy)are also affecting patient's functional outcome.   REHAB POTENTIAL: Good  CLINICAL DECISION MAKING: Unstable/unpredictable  EVALUATION COMPLEXITY: High   GOALS: Goals reviewed with patient? Yes  SHORT TERM GOALS: Target date: 05/29/2024  Pt will be independent with HEP in order to improve strength and decrease back pain to improve pain-free function at home and work. Baseline: 05/07/24: Baseline HEP initiated  Goal status: INITIAL  2.  Pt will decrease worst back pain by at least 2 points on the NPRS in order to demonstrate clinically significant reduction in back pain. Baseline: 05/07/24: 10/10 pain at worst.  Goal status: INITIAL   LONG TERM GOALS: Target date: 07/05/2024  Pt will decrease mODI score by at least 13 points in order demonstrate clinically significant reduction in back pain/disability.    Baseline: 05/07/24: 32% Goal status: INITIAL  2.  Pt will decrease worst back pain to no more than 2-3/10 on the NPRS in order to demonstrate clinically significant  reduction in back pain. Baseline: 05/07/24: 10/10 at worst.  Goal status: INITIAL  3.  Patient will have full thoracolumbar AROM without reproduction of pain as needed for reaching items on ground, household chores, bending.  Baseline: 05/07/24: Motion loss and pain with flexion/extension, L lateral flexion. Motion loss R lateral flexion.  Goal status: INITIAL  4.  Patient will demonstrate safe negotiation of flight of stairs with R handrail support without reproduction of pain and adequate toe clearance on each step as needed for getting to upstairs bedroom in his apartment.  Baseline:  05/07/24: Notable difficulty with stair negotiation.  Goal status: INITIAL  5.  Patient will improve L lower limb MMTs to 4+/5 or greater indicative of improved strength as needed for lower limb lifting during self-care ADLs and adequate dorsiflexor strength for loading response during gait cycle and toe clearance during LLE swing.  Baseline: 05/07/24: MMTs 4- to 4 with exception of srong L quadriceps.  Goal status: INITIAL   PLAN: PT FREQUENCY: 1-2x/week  PT DURATION: 8 weeks  PLANNED INTERVENTIONS: Therapeutic exercises, Therapeutic activity, Neuromuscular re-education, Balance training, Gait training, Patient/Family education, Self Care, Joint mobilization, Joint manipulation, Vestibular training, Canalith repositioning, Orthotic/Fit training, DME instructions, Dry Needling, Electrical stimulation, Spinal manipulation, Spinal mobilization, Cryotherapy, Moist heat, Taping, Traction, Ultrasound, Ionotophoresis 4mg /ml Dexamethasone , Manual therapy, and Re-evaluation.  PLAN FOR NEXT SESSION: Re-assess response with extension in lying (trial of prone press up today). Continue with hamstrings dynamic stretching and progressive hip/trunk strengthening. Progress with standing pre-gait and gait training drills as able. Further fall risk testing as indicated.    Venetia Endo, PT, DPT #[id number]  Venetia ONEIDA Endo,  PT 05/30/2024, 5:07 PM

## 2024-05-30 ENCOUNTER — Encounter: Payer: Self-pay | Admitting: Physical Therapy

## 2024-05-31 ENCOUNTER — Encounter: Payer: Self-pay | Admitting: Physical Therapy

## 2024-05-31 ENCOUNTER — Ambulatory Visit: Admitting: Physical Therapy

## 2024-05-31 DIAGNOSIS — M6281 Muscle weakness (generalized): Secondary | ICD-10-CM

## 2024-05-31 DIAGNOSIS — R262 Difficulty in walking, not elsewhere classified: Secondary | ICD-10-CM

## 2024-05-31 DIAGNOSIS — M5459 Other low back pain: Secondary | ICD-10-CM | POA: Diagnosis not present

## 2024-05-31 NOTE — Therapy (Signed)
 OUTPATIENT PHYSICAL THERAPY THORACOLUMBAR TREATMENT   Patient Name: Tyler Winters MRN: [id number] DOB:[birth year], 58 y.o., male Today's Date: 05/31/2024   END OF SESSION:  PT End of Session - 05/31/24 1033     Visit Number 7    Number of Visits 17    Date for PT Re-Evaluation 07/05/24    PT Start Time 1030    PT Stop Time 1113    PT Time Calculation (min) 43 min    Activity Tolerance Patient limited by pain          Past Medical History:  Diagnosis Date   Allergy    Arthritis    Hypertension    Past Surgical History:  Procedure Laterality Date   COLON SURGERY     SPINE SURGERY     Patient Active Problem List   Diagnosis Date Noted   Hyperlipidemia, mixed 03/27/2024   Healthcare maintenance 03/27/2024   Lumbar radiculopathy, chronic 01/18/2024   Anxiety and depression 01/18/2024   Hypertension 01/18/2024   Left leg weakness 01/09/2021   Nicotine dependence 01/09/2021    PCP: Ziglar, Susan K, MD  REFERRING PROVIDER: Ziglar, Susan K, MD  REFERRING DIAG: M54.16 (ICD-10-CM) - Chronic left-sided lumbar radiculopathy   RATIONALE FOR EVALUATION AND TREATMENT: Rehabilitation  THERAPY DIAG: Other low back pain  Difficulty in walking, not elsewhere classified  Muscle weakness (generalized)  ONSET DATE: 2023 around time of Sx  FOLLOW-UP APPT SCHEDULED WITH REFERRING PROVIDER: Yes ; next f/u with PCP 05/08/24  PERTINENT HISTORY: Pt is a 58 year old male referred for chronic L-sided lumbar radiculopathy.  HX of DDD of the cervical spine (C4-C7, s/p allograft spine surgery), chronic myelopathy left leg (patient reports started after aforementioned C-spine surgery 06/2022). Pt had prior C4-6 ACDF in 2019 with complications requiring additional C4-7 allograft surgery. Still has weakness in his left leg, loses his balance and falls. He is walking with a cane now which helps. Pt reports some R upper limb motor control and fine motor limitation.   Pt reports some  L-sided weakness and imbalance associated with this. He states it feels like I had a stroke, but I didn't have a stroke. He reports notable pain with sustained flexion activities e.g. dishes and bathing dog. Pt refrains from heavy lifting.   He reports limitation with LLE dorsiflexing/everting. Pt reports having to use railing/wall for stairs; difficulty with ascent and descent.    PAIN:    Pain Intensity: Present: 1/10, Best: 1/10, Worst: 10/10 Pain location: Lower lumbar region Pain Quality: slow, tense; will worsen with aggravating activities; axial low back; no notable sciatic-type symptoms or LE referred symptoms Radiating: No  Numbness/Tingling: Some numbness in R 4th-5th digits, no LE N/T reported  Focal Weakness: Yes, R hand from cervical myelopathy, L-sided weakness and difficulty lifting L foot  Aggravating factors: sustained forward lean activities  Relieving factors: leaning backward (sitting or standing), reclined sitting position, lying on back with deep breathing; sit to stand; prolonged standing 24-hour pain behavior: worse with specific activities How long can you sit: How long can you stand: History of prior back injury, pain, surgery, or therapy: No Dominant hand: right; pt uses L hand more now due to R hand impairment   Imaging: Yes   IMPRESSION: 1. No acute osseous or ligamentous findings in the spine. No clear explanation for the patient's symptoms. 2. Symmetric foci of T2 hyperintensity in the cord at C5. Given proximity to the prior cervical fusion, this is likely the sequela of remote compressive  myelopathy. No cord edema or expansion. The conus medullaris and cauda equina appear normal. 3. Multilevel cervical spondylosis post anterior discectomy and fusion at C5-6. Mild spinal stenosis at C4-5. Multilevel cervical foraminal narrowing as detailed above. 4. Mild thoracic spine degenerative changes with small disc protrusions at T3-4 and T4-5. No cord  deformity or foraminal compromise. 5. Moderate-sized left paracentral disc protrusion at L4-5 with mass effect on the thecal sac and possible left L5 nerve root encroachment.   Red flags: Negative for bowel/bladder changes, saddle paresthesia, personal history of cancer, h/o spinal tumors, h/o compression fx, h/o abdominal aneurysm, abdominal pain, chills/fever, night sweats, nausea, vomiting, unrelenting pain, first onset of insidious LBP <20 y/o  PRECAUTIONS: Fall  WEIGHT BEARING RESTRICTIONS: No  FALLS: Has patient fallen in last 6 months? Yes. Number of falls 3-4 falls each month  Living Environment Lives with: lives alone; sister lives close by in town; children are in [place] Lives in: House/apartment; 2-story apartment Stairs: Yes: Internal: 14 steps; on right going up Has following equipment at home: walking stick/cane  Prior level of function: Independent  Occupational demands: Out of work due to medical issues/injuries. Pt working on getting SS income   Hobbies: Pt enjoys bowling, video games, darts, playing pool  Patient Goals: Able to walk further, able to get legs to go   OBJECTIVE:  Patient Surveys  Modified Oswestry 16/50 = 32%    GAIT: Distance walked: 30 ft Assistive device utilized: pt uses SPC LUE (pt aware that this is not standard technique); completed gait trial with no AD for assessment Level of assistance: SBA Comments: Lateral trunk flexion during LLE swing phase to clear foot and dec stance time on LLE; decreased unipedal stance time L>R  Posture: Lumbar lordosis: Decreased Mild inc thoracic kyphosis Iliac crest height: Equal bilaterally Lumbar lateral shift: Negative  AROM AROM (Normal range in degrees) AROM  05/07/24  Lumbar   Flexion (65) 50%*  Extension (30) 50%* (more pain with extension)  Right lateral flexion (25) 50%  Left lateral flexion (25) 50%*  Right rotation (30) WFL  Left rotation (30) WFL      Hip Right Left  Flexion  (125)    Extension (15)    Abduction (40)    Adduction     Internal Rotation (45)    External Rotation (45)        (* = pain; Blank rows = not tested)  LE MMT: MMT (out of 5) Right 05/07/24 Left 05/07/24   Hip flexion 4 4-  Hip extension 4- 4-  Hip abduction    Hip adduction    Hip internal rotation    Hip external rotation    Knee flexion 4+ 4  Knee extension 5 4+  Ankle dorsiflexion 5 4-  Ankle plantarflexion    Ankle inversion    Ankle eversion    (* = pain; Blank rows = not tested)  Sensation Grossly intact to light touch throughout bilateral LEs with exception of L great toe. Proprioception, stereognosis, and hot/cold testing deferred on this date.  Reflexes R/L Bracioradialis: 2+/2+ Biceps: 1+/1+ Triceps: Unable to obtain/2+ Knee Jerk (L3/4): 3+/3+  Ankle Jerk (S1/2): 2+/2+  Clonus: Negative (2-3 beats per side) Hoffman's Sign: Negative  Muscle Length Hamstrings: R: Positive L: Positive E[place] (quadriceps): R: Not examined L: Not examined   Palpation Location Right Left         Lumbar paraspinals 1 2  Quadratus Lumborum 0 0  Gluteus Maximus 1 1  Gluteus  Medius 1 1  Deep hip external rotators    PSIS    Fortin's Area (SIJ)    Greater Trochanter    (Blank rows = not tested) Graded on 0-4 scale (0 = no pain, 1 = pain, 2 = pain with wincing/grimacing/flinching, 3 = pain with withdrawal, 4 = unwilling to allow palpation)  Passive Accessory Intervertebral Motion (updated 05/09/24) Hypomobile L3-L5; pain at restriction   Special Tests Lumbar Radiculopathy and Discogenic: Centralization and Peripheralization (SN 92, -LR 0.12): Repeated extension in lying; mild axial pain afterward, no c/o of increased pain after brief rest period (will need further assessment Slump (SN 83, -LR 0.32): R: Negative L: Negative SLR (SN 92, -LR 0.29): R: Negative L:  Negative  Facet Joint: Extension-Rotation (SN 100, -LR 0.0): R: Not examined L: Not examined  Lumbar Foraminal  Stenosis: Lumbar quadrant (SN 70): R: Negative L: Positive (pain felt on R paraspinal)     TODAY'S TREATMENT: DATE: 05/31/2024    SUBJECTIVE STATEMENT:   Pt reports 4/10 pain at arrival to PT. Patient reports doing well with his current HEP. Pt reports that his wife observed him hitting his toe on edge of step with attempted toe tapping drill. Pt reports minor gait instability with stepping over rug. He reports soreness and fatigue after last visit - pt states he went to sleep soon after PT. He reports increased hydration does seem to help with recent dizziness with change in body position.     Manual Therapy - for symptom modulation, soft tissue sensitivity and mobility, joint mobility, ROM   STM along iliocostalis and longissimus lumborum L2-5 bilaterally; x 12 minutes    Therapeutic Exercise - for improved soft tissue flexibility and extensibility as needed for ROM, improved strength as needed to improve performance of CKC activities/functional movements   NuStep; Level 5, x 5 minutes - for improved soft tissue mobility and increased tissue temperature to improve muscle performance   -subjective gathered during this time   Lower trunk rotations; 1 x 10, 2 sec hold at end-range  Open book; 1 x 10 on each side   -intermittent dizziness with rolling to L and completing open book in L sidelying  Quadruped side bend; 1 x 10 alternating R/L  Bird dog; 1 x 10 alternating R/L   *not today* Dying bug; hooklying for starting position; 1 x 10, alt Repeated extension in lying; 1 x 10  -tension in back during, improved tension in back after Extension with forearms on wall; 1 x 10   Therapeutic Activities - exercise to promote improved foot clearance during gait and improved stepping pattern   Hurdle stepping; 3 6-inch steps; 4x D/B completed in open environment (no // bars)  Standing heel raise/toe raise; 2 x 15, alternating  -increased reps for increased neural drive to  tibialis anterior  PATIENT EDUCATION: Discussed potential contributions to reported dizziness with rapid change in body/head position. I recommended discussing this issue with MD for potential referral to ENT prn.    *not today* Toe tap on 12-inch step; staircase center of gym; 2 x 10 alternating R/L Hurdle step over single ankle weight flat on floor; 2 x 10 with either LE Bridge; 1 x 10, maintenance of neutral spine Stair negotiation; up/down staircase in center of gym; x 1 with bilat handrail support, x 1 with R handrail going up Standing heel raise/toe raise; light touch on adjacent chair; 1 x 10  -for HEP update   PATIENT EDUCATION:  Education details: see above  for patient education details Person educated: Patient Education method: Explanation, Demonstration, and Handouts Education comprehension: verbalized understanding   HOME EXERCISE PROGRAM:  Access Code: [email or web address] URL: [email or web address] Date: 05/09/2024 Prepared by: Venetia Endo  Exercises - Prone Press Up  - 5-6 x daily - 7 x weekly - 1 sets - 10 reps - 1sec hold - Supine Hamstring Stretch  - 2 x daily - 7 x weekly - 2 sets - 10 reps - 1sec hold - Supine Lower Trunk Rotation  - 2 x daily - 7 x weekly - 2 sets - 10 reps - Supine Bridge  - 1 x daily - 7 x weekly - 2 sets - 10 reps - Standing Toe Raises at Chair  - 2 x daily - 7 x weekly - 2 sets - 10 reps   ASSESSMENT:  CLINICAL IMPRESSION: Patient feels that improved hydration has helped with dizziness - given use of hydrochlorothiazide  and self-reported minimal hydration through much of the day, this was suggested to pt last visit. However, he does have dizziness intermittently (lasting for seconds) with rolling in supine; we discussed potential contribution of vestibular system to dizziness and f/u with MD for outside referral/specialist prn. Our clinic also offers vestibular screenings, and this was brought up to patient as possibility if  dizziness persists. Pt has 4/10 NPRS at arrival and is able to modestly progress with extension-bias exercise and drills to promote foot clearance/active dorsiflexion with swing phase of LLE. Pt has current deficits in: thoracolumbar AROM, posterior chain/hamstrings decreased flexibility, L>R lower limb weakness and apparent L5 myotomal deficit, associated gait changes, and axial pain affecting lower lumbar region. Pt will continue to benefit from skilled PT services to address deficits and improve function.   OBJECTIVE IMPAIRMENTS: Abnormal gait, decreased balance, difficulty walking, decreased ROM, decreased strength, hypomobility, impaired flexibility, postural dysfunction, and pain.   ACTIVITY LIMITATIONS: carrying, lifting, bending, sitting, standing, squatting, stairs, transfers, and locomotion level  PARTICIPATION LIMITATIONS: meal prep, cleaning, driving, and community activity  PERSONAL FACTORS: Past/current experiences, Time since onset of injury/illness/exacerbation, and 3+ comorbidities: (HTN, OA, anxiety, depression, Hx of ACDF and cervical myelopathy)are also affecting patient's functional outcome.   REHAB POTENTIAL: Good  CLINICAL DECISION MAKING: Unstable/unpredictable  EVALUATION COMPLEXITY: High   GOALS: Goals reviewed with patient? Yes  SHORT TERM GOALS: Target date: 05/29/2024  Pt will be independent with HEP in order to improve strength and decrease back pain to improve pain-free function at home and work. Baseline: 05/07/24: Baseline HEP initiated  Goal status: INITIAL  2.  Pt will decrease worst back pain by at least 2 points on the NPRS in order to demonstrate clinically significant reduction in back pain. Baseline: 05/07/24: 10/10 pain at worst.  Goal status: INITIAL   LONG TERM GOALS: Target date: 07/05/2024  Pt will decrease mODI score by at least 13 points in order demonstrate clinically significant reduction in back pain/disability.    Baseline: 05/07/24:  32% Goal status: INITIAL  2.  Pt will decrease worst back pain to no more than 2-3/10 on the NPRS in order to demonstrate clinically significant reduction in back pain. Baseline: 05/07/24: 10/10 at worst.  Goal status: INITIAL  3.  Patient will have full thoracolumbar AROM without reproduction of pain as needed for reaching items on ground, household chores, bending.  Baseline: 05/07/24: Motion loss and pain with flexion/extension, L lateral flexion. Motion loss R lateral flexion.  Goal status: INITIAL  4.  Patient will demonstrate safe negotiation of flight of stairs with  R handrail support without reproduction of pain and adequate toe clearance on each step as needed for getting to upstairs bedroom in his apartment.  Baseline: 05/07/24: Notable difficulty with stair negotiation.  Goal status: INITIAL  5.  Patient will improve L lower limb MMTs to 4+/5 or greater indicative of improved strength as needed for lower limb lifting during self-care ADLs and adequate dorsiflexor strength for loading response during gait cycle and toe clearance during LLE swing.  Baseline: 05/07/24: MMTs 4- to 4 with exception of srong L quadriceps.  Goal status: INITIAL   PLAN: PT FREQUENCY: 1-2x/week  PT DURATION: 8 weeks  PLANNED INTERVENTIONS: Therapeutic exercises, Therapeutic activity, Neuromuscular re-education, Balance training, Gait training, Patient/Family education, Self Care, Joint mobilization, Joint manipulation, Vestibular training, Canalith repositioning, Orthotic/Fit training, DME instructions, Dry Needling, Electrical stimulation, Spinal manipulation, Spinal mobilization, Cryotherapy, Moist heat, Taping, Traction, Ultrasound, Ionotophoresis 4mg /ml Dexamethasone , Manual therapy, and Re-evaluation.  PLAN FOR NEXT SESSION: Re-assess response with extension in lying (trial of prone press up today). Continue with hamstrings dynamic stretching and progressive hip/trunk strengthening. Progress with  standing pre-gait and gait training drills as able. Further fall risk testing as indicated.    Venetia Endo, PT, DPT #[id number]  Venetia ONEIDA Endo, PT 05/31/2024, 10:33 AM

## 2024-06-05 ENCOUNTER — Ambulatory Visit: Admitting: Physical Therapy

## 2024-06-05 DIAGNOSIS — M5459 Other low back pain: Secondary | ICD-10-CM | POA: Diagnosis not present

## 2024-06-05 DIAGNOSIS — M6281 Muscle weakness (generalized): Secondary | ICD-10-CM

## 2024-06-05 DIAGNOSIS — R262 Difficulty in walking, not elsewhere classified: Secondary | ICD-10-CM

## 2024-06-05 NOTE — Therapy (Unsigned)
 OUTPATIENT PHYSICAL THERAPY THORACOLUMBAR TREATMENT   Patient Name: Tyler Winters MRN: [id number] DOB:1966/04/28, 58 y.o., male Today's Date: 06/05/2024   END OF SESSION:  PT End of Session - 06/05/24 1045     Visit Number 8    Number of Visits 17    Date for PT Re-Evaluation 07/05/24    PT Start Time 1038    PT Stop Time 1120    PT Time Calculation (min) 42 min    Activity Tolerance Patient limited by pain           Past Medical History:  Diagnosis Date   Allergy    Arthritis    Hypertension    Past Surgical History:  Procedure Laterality Date   COLON SURGERY     SPINE SURGERY     Patient Active Problem List   Diagnosis Date Noted   Hyperlipidemia, mixed 03/27/2024   Healthcare maintenance 03/27/2024   Lumbar radiculopathy, chronic 01/18/2024   Anxiety and depression 01/18/2024   Hypertension 01/18/2024   Left leg weakness 01/09/2021   Nicotine dependence 01/09/2021    PCP: Ziglar, Susan K, MD  REFERRING PROVIDER: Ziglar, Susan K, MD  REFERRING DIAG: M54.16 (ICD-10-CM) - Chronic left-sided lumbar radiculopathy   RATIONALE FOR EVALUATION AND TREATMENT: Rehabilitation  THERAPY DIAG: Other low back pain  Difficulty in walking, not elsewhere classified  Muscle weakness (generalized)  ONSET DATE: 2023 around time of Sx  FOLLOW-UP APPT SCHEDULED WITH REFERRING PROVIDER: Yes ; next f/u with PCP 05/08/24  PERTINENT HISTORY: Pt is a 58 year old male referred for chronic L-sided lumbar radiculopathy.  HX of DDD of the cervical spine (C4-C7, s/p allograft spine surgery), chronic myelopathy left leg (patient reports started after aforementioned C-spine surgery 06/2022). Pt had prior C4-6 ACDF in 2019 with complications requiring additional C4-7 allograft surgery. Still has weakness in his left leg, loses his balance and falls. He is walking with a cane now which helps. Pt reports some R upper limb motor control and fine motor limitation.   Pt reports  some L-sided weakness and imbalance associated with this. He states it feels like I had a stroke, but I didn't have a stroke. He reports notable pain with sustained flexion activities e.g. dishes and bathing dog. Pt refrains from heavy lifting.   He reports limitation with LLE dorsiflexing/everting. Pt reports having to use railing/wall for stairs; difficulty with ascent and descent.    PAIN:    Pain Intensity: Present: 1/10, Best: 1/10, Worst: 10/10 Pain location: Lower lumbar region Pain Quality: slow, tense; will worsen with aggravating activities; axial low back; no notable sciatic-type symptoms or LE referred symptoms Radiating: No  Numbness/Tingling: Some numbness in R 4th-5th digits, no LE N/T reported  Focal Weakness: Yes, R hand from cervical myelopathy, L-sided weakness and difficulty lifting L foot  Aggravating factors: sustained forward lean activities  Relieving factors: leaning backward (sitting or standing), reclined sitting position, lying on back with deep breathing; sit to stand; prolonged standing 24-hour pain behavior: worse with specific activities How long can you sit: How long can you stand: History of prior back injury, pain, surgery, or therapy: No Dominant hand: right; pt uses L hand more now due to R hand impairment   Imaging: Yes   IMPRESSION: 1. No acute osseous or ligamentous findings in the spine. No clear explanation for the patient's symptoms. 2. Symmetric foci of T2 hyperintensity in the cord at C5. Given proximity to the prior cervical fusion, this is likely the sequela of remote  compressive myelopathy. No cord edema or expansion. The conus medullaris and cauda equina appear normal. 3. Multilevel cervical spondylosis post anterior discectomy and fusion at C5-6. Mild spinal stenosis at C4-5. Multilevel cervical foraminal narrowing as detailed above. 4. Mild thoracic spine degenerative changes with small disc protrusions at T3-4 and T4-5. No cord  deformity or foraminal compromise. 5. Moderate-sized left paracentral disc protrusion at L4-5 with mass effect on the thecal sac and possible left L5 nerve root encroachment.   Red flags: Negative for bowel/bladder changes, saddle paresthesia, personal history of cancer, h/o spinal tumors, h/o compression fx, h/o abdominal aneurysm, abdominal pain, chills/fever, night sweats, nausea, vomiting, unrelenting pain, first onset of insidious LBP <20 y/o  PRECAUTIONS: Fall  WEIGHT BEARING RESTRICTIONS: No  FALLS: Has patient fallen in last 6 months? Yes. Number of falls 3-4 falls each month  Living Environment Lives with: lives alone; sister lives close by in town; children are in [place] Lives in: House/apartment; 2-story apartment Stairs: Yes: Internal: 14 steps; on right going up Has following equipment at home: walking stick/cane  Prior level of function: Independent  Occupational demands: Out of work due to medical issues/injuries. Pt working on getting SS income   Hobbies: Pt enjoys bowling, video games, darts, playing pool  Patient Goals: Able to walk further, able to get legs to go   OBJECTIVE:  Patient Surveys  Modified Oswestry 16/50 = 32%    GAIT: Distance walked: 30 ft Assistive device utilized: pt uses SPC LUE (pt aware that this is not standard technique); completed gait trial with no AD for assessment Level of assistance: SBA Comments: Lateral trunk flexion during LLE swing phase to clear foot and dec stance time on LLE; decreased unipedal stance time L>R  Posture: Lumbar lordosis: Decreased Mild inc thoracic kyphosis Iliac crest height: Equal bilaterally Lumbar lateral shift: Negative  AROM AROM (Normal range in degrees) AROM  05/07/24  Lumbar   Flexion (65) 50%*  Extension (30) 50%* (more pain with extension)  Right lateral flexion (25) 50%  Left lateral flexion (25) 50%*  Right rotation (30) WFL  Left rotation (30) WFL      Hip Right Left  Flexion  (125)    Extension (15)    Abduction (40)    Adduction     Internal Rotation (45)    External Rotation (45)        (* = pain; Blank rows = not tested)  LE MMT: MMT (out of 5) Right 05/07/24 Left 05/07/24   Hip flexion 4 4-  Hip extension 4- 4-  Hip abduction    Hip adduction    Hip internal rotation    Hip external rotation    Knee flexion 4+ 4  Knee extension 5 4+  Ankle dorsiflexion 5 4-  Ankle plantarflexion    Ankle inversion    Ankle eversion    (* = pain; Blank rows = not tested)  Sensation Grossly intact to light touch throughout bilateral LEs with exception of L great toe. Proprioception, stereognosis, and hot/cold testing deferred on this date.  Reflexes R/L Bracioradialis: 2+/2+ Biceps: 1+/1+ Triceps: Unable to obtain/2+ Knee Jerk (L3/4): 3+/3+  Ankle Jerk (S1/2): 2+/2+  Clonus: Negative (2-3 beats per side) Hoffman's Sign: Negative  Muscle Length Hamstrings: R: Positive L: Positive Ely (quadriceps): R: Not examined L: Not examined   Palpation Location Right Left         Lumbar paraspinals 1 2  Quadratus Lumborum 0 0  Gluteus Maximus 1 1  Gluteus Medius 1 1  Deep hip external rotators    PSIS    Fortin's Area (SIJ)    Greater Trochanter    (Blank rows = not tested) Graded on 0-4 scale (0 = no pain, 1 = pain, 2 = pain with wincing/grimacing/flinching, 3 = pain with withdrawal, 4 = unwilling to allow palpation)  Passive Accessory Intervertebral Motion (updated 05/09/24) Hypomobile L3-L5; pain at restriction   Special Tests Lumbar Radiculopathy and Discogenic: Centralization and Peripheralization (SN 92, -LR 0.12): Repeated extension in lying; mild axial pain afterward, no c/o of increased pain after brief rest period (will need further assessment Slump (SN 83, -LR 0.32): R: Negative L: Negative SLR (SN 92, -LR 0.29): R: Negative L:  Negative  Facet Joint: Extension-Rotation (SN 100, -LR 0.0): R: Not examined L: Not examined  Lumbar Foraminal  Stenosis: Lumbar quadrant (SN 70): R: Negative L: Positive (pain felt on R paraspinal)     TODAY'S TREATMENT: DATE: 06/05/2024    SUBJECTIVE STATEMENT:   Pt reports 4/10 pain at arrival to PT while on Prebagalin 25 mg. Pt reports dizziness with rolling over in bed or performing transfers rapidly. He reports intermittent dragging L foot when going on carpet/change in floor surface.    Therapeutic Exercise - for improved soft tissue flexibility and extensibility as needed for ROM, improved strength as needed to improve performance of CKC activities/functional movements   NuStep; Level 5, x 5 minutes - for improved soft tissue mobility and increased tissue temperature to improve muscle performance   -subjective gathered during this time  Repeated flexion in lying/double knee to chest; 2 x 10  - no effect during, minimal symptoms after - 1st set  - no effect during, pain in axial low back after - 2nd set   - 4/10 to 6/10  Repeated extension in lying/prone press up; 1 x 10  - no complaint during, tension/tightness after  -6/10 to 4/10   Quadruped side bend; 1 x 10 alternating R/L Cat Camel;  1 x 10 alternating up/down  Bird dog; 1 x 10 alternating R/L    Open book; 1 x 10 on each side   -intermittent dizziness with rolling to L and completing open book in L sidelying   *not today* Lower trunk rotations; 1 x 10, 2 sec hold at end-range Dying bug; hooklying for starting position; 1 x 10, alt Extension with forearms on wall; 1 x 10   Therapeutic Activities - exercise to promote improved foot clearance during gait and improved stepping pattern   Hurdle stepping; 3 6-inch steps; 4x D/B completed in open environment (no // bars)  Standing heel raise/toe raise; 2 x 15, alternating  -increased reps for increased neural drive to tibialis anterior  PATIENT EDUCATION: Discussed potential contributions to reported dizziness with rapid change in body/head position. I recommended  discussing this issue with MD for potential referral to ENT prn.    *not today* Toe tap on 12-inch step; staircase center of gym; 2 x 10 alternating R/L Hurdle step over single ankle weight flat on floor; 2 x 10 with either LE Bridge; 1 x 10, maintenance of neutral spine Stair negotiation; up/down staircase in center of gym; x 1 with bilat handrail support, x 1 with R handrail going up Standing heel raise/toe raise; light touch on adjacent chair; 1 x 10  -for HEP update   PATIENT EDUCATION:  Education details: see above for patient education details Person educated: Patient Education method: Explanation, Demonstration, and Handouts Education comprehension:  verbalized understanding   HOME EXERCISE PROGRAM:  Access Code: [email or web address] URL: [email or web address] Date: 05/09/2024 Prepared by: Venetia Endo  Exercises - Prone Press Up  - 5-6 x daily - 7 x weekly - 1 sets - 10 reps - 1sec hold - Supine Hamstring Stretch  - 2 x daily - 7 x weekly - 2 sets - 10 reps - 1sec hold - Supine Lower Trunk Rotation  - 2 x daily - 7 x weekly - 2 sets - 10 reps - Supine Bridge  - 1 x daily - 7 x weekly - 2 sets - 10 reps - Standing Toe Raises at Chair  - 2 x daily - 7 x weekly - 2 sets - 10 reps   ASSESSMENT:  CLINICAL IMPRESSION: Patient feels that improved hydration has helped with dizziness - given use of hydrochlorothiazide  and self-reported minimal hydration through much of the day, this was suggested to pt last visit. However, he does have dizziness intermittently (lasting for seconds) with rolling in supine; we discussed potential contribution of vestibular system to dizziness and f/u with MD for outside referral/specialist prn. Our clinic also offers vestibular screenings, and this was brought up to patient as possibility if dizziness persists. Pt has 4/10 NPRS at arrival and is able to modestly progress with extension-bias exercise and drills to promote foot clearance/active  dorsiflexion with swing phase of LLE. Pt has current deficits in: thoracolumbar AROM, posterior chain/hamstrings decreased flexibility, L>R lower limb weakness and apparent L5 myotomal deficit, associated gait changes, and axial pain affecting lower lumbar region. Pt will continue to benefit from skilled PT services to address deficits and improve function.   OBJECTIVE IMPAIRMENTS: Abnormal gait, decreased balance, difficulty walking, decreased ROM, decreased strength, hypomobility, impaired flexibility, postural dysfunction, and pain.   ACTIVITY LIMITATIONS: carrying, lifting, bending, sitting, standing, squatting, stairs, transfers, and locomotion level  PARTICIPATION LIMITATIONS: meal prep, cleaning, driving, and community activity  PERSONAL FACTORS: Past/current experiences, Time since onset of injury/illness/exacerbation, and 3+ comorbidities: (HTN, OA, anxiety, depression, Hx of ACDF and cervical myelopathy)are also affecting patient's functional outcome.   REHAB POTENTIAL: Good  CLINICAL DECISION MAKING: Unstable/unpredictable  EVALUATION COMPLEXITY: High   GOALS: Goals reviewed with patient? Yes  SHORT TERM GOALS: Target date: 05/29/2024  Pt will be independent with HEP in order to improve strength and decrease back pain to improve pain-free function at home and work. Baseline: 05/07/24: Baseline HEP initiated  Goal status: INITIAL  2.  Pt will decrease worst back pain by at least 2 points on the NPRS in order to demonstrate clinically significant reduction in back pain. Baseline: 05/07/24: 10/10 pain at worst.  Goal status: INITIAL   LONG TERM GOALS: Target date: 07/05/2024  Pt will decrease mODI score by at least 13 points in order demonstrate clinically significant reduction in back pain/disability.    Baseline: 05/07/24: 32% Goal status: INITIAL  2.  Pt will decrease worst back pain to no more than 2-3/10 on the NPRS in order to demonstrate clinically significant reduction  in back pain. Baseline: 05/07/24: 10/10 at worst.  Goal status: INITIAL  3.  Patient will have full thoracolumbar AROM without reproduction of pain as needed for reaching items on ground, household chores, bending.  Baseline: 05/07/24: Motion loss and pain with flexion/extension, L lateral flexion. Motion loss R lateral flexion.  Goal status: INITIAL  4.  Patient will demonstrate safe negotiation of flight of stairs with R handrail support without reproduction of pain and adequate toe clearance on each step as  needed for getting to upstairs bedroom in his apartment.  Baseline: 05/07/24: Notable difficulty with stair negotiation.  Goal status: INITIAL  5.  Patient will improve L lower limb MMTs to 4+/5 or greater indicative of improved strength as needed for lower limb lifting during self-care ADLs and adequate dorsiflexor strength for loading response during gait cycle and toe clearance during LLE swing.  Baseline: 05/07/24: MMTs 4- to 4 with exception of srong L quadriceps.  Goal status: INITIAL   PLAN: PT FREQUENCY: 1-2x/week  PT DURATION: 8 weeks  PLANNED INTERVENTIONS: Therapeutic exercises, Therapeutic activity, Neuromuscular re-education, Balance training, Gait training, Patient/Family education, Self Care, Joint mobilization, Joint manipulation, Vestibular training, Canalith repositioning, Orthotic/Fit training, DME instructions, Dry Needling, Electrical stimulation, Spinal manipulation, Spinal mobilization, Cryotherapy, Moist heat, Taping, Traction, Ultrasound, Ionotophoresis 4mg /ml Dexamethasone , Manual therapy, and Re-evaluation.  PLAN FOR NEXT SESSION: Re-assess response with extension in lying (trial of prone press up today). Continue with hamstrings dynamic stretching and progressive hip/trunk strengthening. Progress with standing pre-gait and gait training drills as able. Further fall risk testing as indicated.    Venetia Endo, PT, DPT #[id number]  Venetia ONEIDA Endo,  PT 06/05/2024, 11:27 AM

## 2024-06-06 ENCOUNTER — Encounter: Payer: Self-pay | Admitting: Physical Therapy

## 2024-06-06 ENCOUNTER — Encounter: Payer: Self-pay | Admitting: Family Medicine

## 2024-06-06 ENCOUNTER — Ambulatory Visit (INDEPENDENT_AMBULATORY_CARE_PROVIDER_SITE_OTHER): Admitting: Family Medicine

## 2024-06-06 VITALS — BP 143/101 | HR 66 | Temp 98.2°F | Resp 18 | Ht 66.0 in | Wt 205.0 lb

## 2024-06-06 DIAGNOSIS — H8110 Benign paroxysmal vertigo, unspecified ear: Secondary | ICD-10-CM | POA: Insufficient documentation

## 2024-06-06 DIAGNOSIS — I10 Essential (primary) hypertension: Secondary | ICD-10-CM | POA: Diagnosis not present

## 2024-06-06 DIAGNOSIS — E782 Mixed hyperlipidemia: Secondary | ICD-10-CM

## 2024-06-06 MED ORDER — HYDROCHLOROTHIAZIDE 25 MG PO TABS: 25.0000 mg | ORAL_TABLET | Freq: Every day | ORAL | 3 refills | Status: DC

## 2024-06-06 NOTE — Assessment & Plan Note (Signed)
 Started Crestor  10mg  in May.  Will check lipid panel in August

## 2024-06-06 NOTE — Progress Notes (Addendum)
 Established Patient Office Visit  Subjective   Patient ID: Tyler Winters, male    DOB: 07/21/1966  Age: 58 y.o. MRN: [id number]  Chief Complaint  Patient presents with   Medical Management of Chronic Issues    HPI Delightful 58 year old male with a HX of DDD of the cervical spine (C4-C7, s/p allograft spine surgery), chronic myelopathy left leg, patient reports started after surgery 06/2022), chronic lumbar pain and lumbar radiculopathy left leg.    Discussed the use of AI scribe software for clinical note transcription with the patient, who gave verbal consent to proceed.  History of Present Illness   Tyler Winters is a 58 year old male who presents with dizziness and hand pain after a fall. He is accompanied by his wife.  He has been experiencing dizziness for about a month, described as feeling 'dizzy, drunk, something, something wrong' when moving too fast. The dizziness is particularly pronounced when rolling over in bed, causing him to keep his eyes closed when getting up at night to avoid feeling like he is going to fall. He associates the onset of dizziness with the initiation of Lyrica , which he started for back pain management. No other symptoms related to the dizziness.  Approximately two weeks ago, he experienced a fall during which he injured his hand. He describes making a fist as he fell, resulting in pain in his hand and thumb. Although the hand is now 'a little sore,' it is improving, and he is able to make a fist again.  He continues to experience lumbar back pain, which is exacerbated by activities such as doing dishes and putting items in cabinets. The pain persists when he leans forward slightly while doing these activities. He is currently undergoing physical therapy, which includes exercises like hurdles and back exercises that involve bringing his knee to his head. The exercises initially make his back sore, but the pain subsides with relaxation. He is  taking Lyrica  for his back pain.  He is on amlodipine  10 mg and HCTZ 12.5 mg for blood pressure management. He has a BP cuff but is not checking his BP at home.  BP is still elevated in clinic.      ROS    Objective:     BP (!) 143/101 (BP Location: Left Arm, Patient Position: Sitting, Cuff Size: Normal)   Pulse 66   Temp 98.2 F (36.8 C) (Oral)   Resp 18   Ht 5' 6 (1.676 m)   Wt 205 lb (93 kg)   SpO2 96%   BMI 33.09 kg/m    Physical Exam Vitals and nursing note reviewed.  Constitutional:      Appearance: Normal appearance.  HENT:     Head: Normocephalic and atraumatic.  Eyes:     Conjunctiva/sclera: Conjunctivae normal.  Cardiovascular:     Rate and Rhythm: Normal rate and regular rhythm.  Pulmonary:     Effort: Pulmonary effort is normal.     Breath sounds: Normal breath sounds.  Musculoskeletal:     Right lower leg: No edema.     Left lower leg: No edema.  Skin:    General: Skin is warm and dry.  Neurological:     Mental Status: He is alert and oriented to person, place, and time.     Comments: Dizziness when rolls to either the left or right.  More dizziness when head extended in supine position.  No nystagmus with maneuvers.  No nystagmus to far lateral gaze.  Psychiatric:        Mood and Affect: Mood normal.        Behavior: Behavior normal.        Thought Content: Thought content normal.        Judgment: Judgment normal.          No results found for any visits on 06/06/24.    The 10-year ASCVD risk score (Arnett DK, et al., 2019) is: 20.8%    Assessment & Plan:   Hypertension_ Not adequately controlled.  Continue amlodipine  10 mg daily and increase HCTZ to 25 mg daily. Mixed hyperlipidemia-started Crestor  10mg   in May.  Will check lipid profile in August Chronic lumbar radiculopathy-doing well in physical therapy.  Please continue physical therapy.  Please continue Lyrica  25 mg twice daily. Benign positional vertigo-referral to physical  therapy for treatment. 5.   Hand Injury-Recent injury from a fall two weeks ago, resulting in soreness. Will monitor.  If pain persists, may consider x-rays.      Return in about 4 weeks (around 07/04/2024).    Lawernce Earll K Jonluke Cobbins, MD

## 2024-06-06 NOTE — Assessment & Plan Note (Addendum)
 Do not have adequate control of his blood pressure.  He is on amlodipine  10 mg and HCTZ 12.5.  Will increase HCTZ to 25 mg daily and see him in a month.

## 2024-06-06 NOTE — Assessment & Plan Note (Signed)
-   Recommended physical therapy

## 2024-06-07 ENCOUNTER — Ambulatory Visit: Admitting: Physical Therapy

## 2024-06-07 DIAGNOSIS — M5459 Other low back pain: Secondary | ICD-10-CM

## 2024-06-07 DIAGNOSIS — M6281 Muscle weakness (generalized): Secondary | ICD-10-CM

## 2024-06-07 DIAGNOSIS — R262 Difficulty in walking, not elsewhere classified: Secondary | ICD-10-CM

## 2024-06-07 NOTE — Therapy (Signed)
 OUTPATIENT PHYSICAL THERAPY THORACOLUMBAR TREATMENT/GOAL UPDATE   Patient Name: Tyler Winters MRN: [id number] DOB:Apr 27, 1966, 58 y.o., male Today's Date: 06/07/2024   END OF SESSION:  PT End of Session - 06/07/24 1300     Visit Number 9    Number of Visits 17    Date for PT Re-Evaluation 07/05/24    PT Start Time 1300    PT Stop Time 1342    PT Time Calculation (min) 42 min    Activity Tolerance Patient limited by pain          Past Medical History:  Diagnosis Date   Allergy    Arthritis    Hypertension    Past Surgical History:  Procedure Laterality Date   COLON SURGERY     SPINE SURGERY     Patient Active Problem List   Diagnosis Date Noted   Benign positional vertigo 06/06/2024   Hyperlipidemia, mixed 03/27/2024   Healthcare maintenance 03/27/2024   Lumbar radiculopathy, chronic 01/18/2024   Anxiety and depression 01/18/2024   Hypertension 01/18/2024   Left leg weakness 01/09/2021   Nicotine dependence 01/09/2021    PCP: Ziglar, Susan K, MD  REFERRING PROVIDER: Ziglar, Susan K, MD  REFERRING DIAG: M54.16 (ICD-10-CM) - Chronic left-sided lumbar radiculopathy   RATIONALE FOR EVALUATION AND TREATMENT: Rehabilitation  THERAPY DIAG: Other low back pain  Difficulty in walking, not elsewhere classified  Muscle weakness (generalized)  ONSET DATE: 2023 around time of Sx  FOLLOW-UP APPT SCHEDULED WITH REFERRING PROVIDER: Yes ; next f/u with PCP 05/08/24  PERTINENT HISTORY: Pt is a 58 year old male referred for chronic L-sided lumbar radiculopathy.  HX of DDD of the cervical spine (C4-C7, s/p allograft spine surgery), chronic myelopathy left leg (patient reports started after aforementioned C-spine surgery 06/2022). Pt had prior C4-6 ACDF in 2019 with complications requiring additional C4-7 allograft surgery. Still has weakness in his left leg, loses his balance and falls. He is walking with a cane now which helps. Pt reports some R upper limb motor  control and fine motor limitation.   Pt reports some L-sided weakness and imbalance associated with this. He states it feels like I had a stroke, but I didn't have a stroke. He reports notable pain with sustained flexion activities e.g. dishes and bathing dog. Pt refrains from heavy lifting.   He reports limitation with LLE dorsiflexing/everting. Pt reports having to use railing/wall for stairs; difficulty with ascent and descent.    PAIN:    Pain Intensity: Present: 1/10, Best: 1/10, Worst: 10/10 Pain location: Lower lumbar region Pain Quality: slow, tense; will worsen with aggravating activities; axial low back; no notable sciatic-type symptoms or LE referred symptoms Radiating: No  Numbness/Tingling: Some numbness in R 4th-5th digits, no LE N/T reported  Focal Weakness: Yes, R hand from cervical myelopathy, L-sided weakness and difficulty lifting L foot  Aggravating factors: sustained forward lean activities  Relieving factors: leaning backward (sitting or standing), reclined sitting position, lying on back with deep breathing; sit to stand; prolonged standing 24-hour pain behavior: worse with specific activities How long can you sit: How long can you stand: History of prior back injury, pain, surgery, or therapy: No Dominant hand: right; pt uses L hand more now due to R hand impairment   Imaging: Yes   IMPRESSION: 1. No acute osseous or ligamentous findings in the spine. No clear explanation for the patient's symptoms. 2. Symmetric foci of T2 hyperintensity in the cord at C5. Given proximity to the prior cervical fusion, this  is likely the sequela of remote compressive myelopathy. No cord edema or expansion. The conus medullaris and cauda equina appear normal. 3. Multilevel cervical spondylosis post anterior discectomy and fusion at C5-6. Mild spinal stenosis at C4-5. Multilevel cervical foraminal narrowing as detailed above. 4. Mild thoracic spine degenerative changes with  small disc protrusions at T3-4 and T4-5. No cord deformity or foraminal compromise. 5. Moderate-sized left paracentral disc protrusion at L4-5 with mass effect on the thecal sac and possible left L5 nerve root encroachment.   Red flags: Negative for bowel/bladder changes, saddle paresthesia, personal history of cancer, h/o spinal tumors, h/o compression fx, h/o abdominal aneurysm, abdominal pain, chills/fever, night sweats, nausea, vomiting, unrelenting pain, first onset of insidious LBP <20 y/o  PRECAUTIONS: Fall  WEIGHT BEARING RESTRICTIONS: No  FALLS: Has patient fallen in last 6 months? Yes. Number of falls 3-4 falls each month  Living Environment Lives with: lives alone; sister lives close by in town; children are in [place] Lives in: House/apartment; 2-story apartment Stairs: Yes: Internal: 14 steps; on right going up Has following equipment at home: walking stick/cane  Prior level of function: Independent  Occupational demands: Out of work due to medical issues/injuries. Pt working on getting SS income   Hobbies: Pt enjoys bowling, video games, darts, playing pool  Patient Goals: Able to walk further, able to get legs to go   OBJECTIVE:  Patient Surveys  Modified Oswestry 16/50 = 32%    GAIT: Distance walked: 30 ft Assistive device utilized: pt uses SPC LUE (pt aware that this is not standard technique); completed gait trial with no AD for assessment Level of assistance: SBA Comments: Lateral trunk flexion during LLE swing phase to clear foot and dec stance time on LLE; decreased unipedal stance time L>R  Posture: Lumbar lordosis: Decreased Mild inc thoracic kyphosis Iliac crest height: Equal bilaterally Lumbar lateral shift: Negative  AROM AROM (Normal range in degrees) AROM  05/07/24 AROM 06/07/24  Lumbar    Flexion (65) 50%* 50%  Extension (30) 50%* (more pain with extension) 50%*  Right lateral flexion (25) 50% 75%  Left lateral flexion (25) 50%* 75%   Right rotation (30) WFL WFL  Left rotation (30) WFL WFL       Hip Right Left   Flexion (125)     Extension (15)     Abduction (40)     Adduction      Internal Rotation (45)     External Rotation (45)          (* = pain; Blank rows = not tested)  LE MMT: MMT (out of 5) Right 05/07/24 Left 05/07/24  Right 06/07/24 Left 06/07/24  Hip flexion 4 4- 4+ 4-  Hip extension 4- 4-    Hip abduction      Hip adduction      Hip internal rotation      Hip external rotation      Knee flexion 4+ 4 5 5-  Knee extension 5 4+ 5 5-  Ankle dorsiflexion 5 4- 5 4  Ankle plantarflexion      Ankle inversion      Ankle eversion      (* = pain; Blank rows = not tested)  Sensation Grossly intact to light touch throughout bilateral LEs with exception of L great toe. Proprioception, stereognosis, and hot/cold testing deferred on this date.  Reflexes R/L Bracioradialis: 2+/2+ Biceps: 1+/1+ Triceps: Unable to obtain/2+ Knee Jerk (L3/4): 3+/3+  Ankle Jerk (S1/2): 2+/2+  Clonus: Negative (  2-3 beats per side) Hoffman's Sign: Negative  Muscle Length Hamstrings: R: Positive L: Positive Ely (quadriceps): R: Not examined L: Not examined   Palpation Location Right Left         Lumbar paraspinals 1 2  Quadratus Lumborum 0 0  Gluteus Maximus 1 1  Gluteus Medius 1 1  Deep hip external rotators    PSIS    Fortin's Area (SIJ)    Greater Trochanter    (Blank rows = not tested) Graded on 0-4 scale (0 = no pain, 1 = pain, 2 = pain with wincing/grimacing/flinching, 3 = pain with withdrawal, 4 = unwilling to allow palpation)  Passive Accessory Intervertebral Motion (updated 05/09/24) Hypomobile L3-L5; pain at restriction   Special Tests Lumbar Radiculopathy and Discogenic: Centralization and Peripheralization (SN 92, -LR 0.12): Repeated extension in lying; mild axial pain afterward, no c/o of increased pain after brief rest period (will need further assessment Slump (SN 83, -LR 0.32): R: Negative L:  Negative SLR (SN 92, -LR 0.29): R: Negative L:  Negative  Facet Joint: Extension-Rotation (SN 100, -LR 0.0): R: Not examined L: Not examined  Lumbar Foraminal Stenosis: Lumbar quadrant (SN 70): R: Negative L: Positive (pain felt on R paraspinal)     TODAY'S TREATMENT: DATE: 06/07/2024    SUBJECTIVE STATEMENT:   Pt reports 4/10 pain affecting low back at arrival to PT. Patient reports having fall yesterday and catching himself on L hand. He reports attempting heel to toe stepping and LLE giving way; he reports hitting his forearm - no resulting pain. Pt feels that he has benefited from PT, but he reports ongoing axial low back pain. He reports continuing work at home to retrain active dorsiflexion with LLE swinging. Pt has comorbid dizziness with position change (including dizziness with bed mobility tasks/rolling); new referral for BPPV. We discussed planning today for evaluation with vestibular PT.     *GOAL UPDATE PERFORMED    Therapeutic Exercise - for improved soft tissue flexibility and extensibility as needed for ROM, improved strength as needed to improve performance of CKC activities/functional movements   NuStep; Level 5, x 5 minutes - for improved soft tissue mobility and increased tissue temperature to improve muscle performance   -subjective gathered during this time  Repeated extension in lying/prone press up; 1 x 10  - pain up to 5-6/10 with first set upon completion  Repeated extension in lying/prone press up with clinician overpressure; x 6 attempted  -notable pain axially, difficulty attaining full ROM  Repeated extension in lying/prone press up with patient overpressure; x 10;  -6 to 7/10 pain in low back   Lower trunk rotations; 1 x 10, 2 sec hold at end-range    Manual Therapy - for symptom modulation, soft tissue sensitivity and mobility, joint mobility, ROM    STM along iliocostalis and longissimus lumborum L2-5 bilaterally; x 12 minutes        MHP (unbilled) utilized during manual therapy for analgesic effect and improved soft tissue extensibility, along low back in prone lying; x 5 minutes.   *not today* Quadruped side bend; 1 x 10 alternating R/L Cat Camel;  1 x 10 alternating up/down Bird dog; 1 x 10 alternating R/L Open book; 1 x 10 on each side   -intermittent dizziness with rolling to L and completing open book in L sidelying Dying bug; hooklying for starting position; 1 x 10, alt Extension with forearms on wall; 1 x 10      PATIENT EDUCATION:  Education details:  see above for patient education details Person educated: Patient Education method: Explanation, Demonstration, and Handouts Education comprehension: verbalized understanding   HOME EXERCISE PROGRAM:  Access Code: [email or web address] URL: [email or web address] Date: 05/09/2024 Prepared by: Venetia Endo  Exercises - Prone Press Up  - 5-6 x daily - 7 x weekly - 1 sets - 10 reps - 1sec hold - Supine Hamstring Stretch  - 2 x daily - 7 x weekly - 2 sets - 10 reps - 1sec hold - Supine Lower Trunk Rotation  - 2 x daily - 7 x weekly - 2 sets - 10 reps - Supine Bridge  - 1 x daily - 7 x weekly - 2 sets - 10 reps - Standing Toe Raises at Chair  - 2 x daily - 7 x weekly - 2 sets - 10 reps   ASSESSMENT:  CLINICAL IMPRESSION: Patient does not have notable benefit with addition of patient or clinician overpressure with repeated extension today - he reports moderate increase in axial pain with force progression. We will need to hold on notable force progression with prone press ups at home. We have discussed use of dry needling as alternative treatment for axial pain if progress is reaching plateau or suboptimal. Pt has met his short-term PT goals; however, pt has not improve mODI and has not met remaining long-term goals. Pt has comorbid dizziness/positional vertigo and new referral for BPPV; pt is scheduled for evaluation with vestibular PT next week. Pt  has remaining deficits in: thoracolumbar AROM, posterior chain/hamstrings decreased flexibility, L>R lower limb weakness and apparent L5 myotomal deficit, associated gait changes, and axial pain affecting lower lumbar region. Pt will continue to benefit from skilled PT services to address deficits and improve function.   OBJECTIVE IMPAIRMENTS: Abnormal gait, decreased balance, difficulty walking, decreased ROM, decreased strength, hypomobility, impaired flexibility, postural dysfunction, and pain.   ACTIVITY LIMITATIONS: carrying, lifting, bending, sitting, standing, squatting, stairs, transfers, and locomotion level  PARTICIPATION LIMITATIONS: meal prep, cleaning, driving, and community activity  PERSONAL FACTORS: Past/current experiences, Time since onset of injury/illness/exacerbation, and 3+ comorbidities: (HTN, OA, anxiety, depression, Hx of ACDF and cervical myelopathy)are also affecting patient's functional outcome.   REHAB POTENTIAL: Good  CLINICAL DECISION MAKING: Unstable/unpredictable  EVALUATION COMPLEXITY: High   GOALS: Goals reviewed with patient? Yes  SHORT TERM GOALS: Target date: 05/29/2024  Pt will be independent with HEP in order to improve strength and decrease back pain to improve pain-free function at home and work. Baseline: 05/07/24: Baseline HEP initiated.    06/07/24: Pt reports intermittent compliance with his HEP, keeps up with [employer] GO app most days.  Goal status: ACHIEVED   2.  Pt will decrease worst back pain by at least 2 points on the NPRS in order to demonstrate clinically significant reduction in back pain. Baseline: 05/07/24: 10/10 pain at worst.   06/07/24: 7/10 at worst.  Goal status: ACHIEVED   LONG TERM GOALS: Target date: 07/05/2024  Pt will decrease mODI score by at least 13 points in order demonstrate clinically significant reduction in back pain/disability.    Baseline: 05/07/24: 32%      06/07/24: 34% Goal status: NOT MET   2.  Pt will  decrease worst back pain to no more than 2-3/10 on the NPRS in order to demonstrate clinically significant reduction in back pain. Baseline: 05/07/24: 10/10 at worst.     06/07/24: 7/10 at worst.  Goal status: IN PROGRESS  3.  Patient will have full thoracolumbar AROM without reproduction of pain  as needed for reaching items on ground, household chores, bending.  Baseline: 05/07/24: Motion loss and pain with flexion/extension, L lateral flexion. Motion loss R lateral flexion.    06/07/24: Improved tolerance of forward flexion, motion loss remaining with flexion/extension and R/L lateral flexion.  Goal status: ON-GOING  4.  Patient will demonstrate safe negotiation of flight of stairs with R handrail support without reproduction of pain and adequate toe clearance on each step as needed for getting to upstairs bedroom in his apartment.  Baseline: 05/07/24: Notable difficulty with stair negotiation.    06/07/24: Deferred Goal status: DEFERRED  5.  Patient will improve L lower limb MMTs to 4+/5 or greater indicative of improved strength as needed for lower limb lifting during self-care ADLs and adequate dorsiflexor strength for loading response during gait cycle and toe clearance during LLE swing.  Baseline: 05/07/24: MMTs 4- to 4 with exception of srong L quadriceps.   06/07/24: Remaining weakness L hip flexors and L ankle dorsiflexors Goal status: IN PROGRESS   PLAN: PT FREQUENCY: 1-2x/week  PT DURATION: 4 weeks, pending vestibular evaluation 06/14/24  PLANNED INTERVENTIONS: Therapeutic exercises, Therapeutic activity, Neuromuscular re-education, Balance training, Gait training, Patient/Family education, Self Care, Joint mobilization, Joint manipulation, Vestibular training, Canalith repositioning, Orthotic/Fit training, DME instructions, Dry Needling, Electrical stimulation, Spinal manipulation, Spinal mobilization, Cryotherapy, Moist heat, Taping, Traction, Ultrasound, Ionotophoresis 4mg /ml  Dexamethasone , Manual therapy, and Re-evaluation.  PLAN FOR NEXT SESSION: Repeated extension in lying and manual therapy for desensitization of longissimus lumborum at L3-5 levels. Consider dry needling at axial lower lumbar spine pending pt consent. Continue with graded movement and trunk/hip strengthening as able. Continue plan of care pending vestibular eval 06/14/24 (pt may hold on lumbar spine PT while this is being treated).   Venetia Endo, PT, DPT #[id number]  Venetia ONEIDA Endo, PT 06/07/2024, 1:00 PM

## 2024-06-11 ENCOUNTER — Encounter: Payer: Self-pay | Admitting: Physical Therapy

## 2024-06-11 ENCOUNTER — Ambulatory Visit: Admitting: Physical Therapy

## 2024-06-11 DIAGNOSIS — M6281 Muscle weakness (generalized): Secondary | ICD-10-CM

## 2024-06-11 DIAGNOSIS — R262 Difficulty in walking, not elsewhere classified: Secondary | ICD-10-CM

## 2024-06-11 DIAGNOSIS — M5459 Other low back pain: Secondary | ICD-10-CM | POA: Diagnosis not present

## 2024-06-11 NOTE — Therapy (Addendum)
 OUTPATIENT PHYSICAL THERAPY TREATMENT AND PROGRESS NOTE   Dates of reporting period  05/07/24   to   06/11/24   Patient Name: Tyler Winters MRN: [id number] DOB:1966/06/05, 58 y.o., male Today's Date: 06/11/2024   END OF SESSION:  PT End of Session - 06/11/24 0817     Visit Number 10    Number of Visits 17    Date for PT Re-Evaluation 07/05/24    PT Start Time 0817    PT Stop Time 0902    PT Time Calculation (min) 45 min    Activity Tolerance Patient limited by pain          Past Medical History:  Diagnosis Date   Allergy    Arthritis    Hypertension    Past Surgical History:  Procedure Laterality Date   COLON SURGERY     SPINE SURGERY     Patient Active Problem List   Diagnosis Date Noted   Benign positional vertigo 06/06/2024   Hyperlipidemia, mixed 03/27/2024   Healthcare maintenance 03/27/2024   Lumbar radiculopathy, chronic 01/18/2024   Anxiety and depression 01/18/2024   Hypertension 01/18/2024   Left leg weakness 01/09/2021   Nicotine dependence 01/09/2021    PCP: Ziglar, Susan K, MD  REFERRING PROVIDER: Ziglar, Susan K, MD  REFERRING DIAG: M54.16 (ICD-10-CM) - Chronic left-sided lumbar radiculopathy   RATIONALE FOR EVALUATION AND TREATMENT: Rehabilitation  THERAPY DIAG: No diagnosis found.  ONSET DATE: 2023 around time of Sx  FOLLOW-UP APPT SCHEDULED WITH REFERRING PROVIDER: Yes ; next f/u with PCP 05/08/24  PERTINENT HISTORY: Pt is a 58 year old male referred for chronic L-sided lumbar radiculopathy.  HX of DDD of the cervical spine (C4-C7, s/p allograft spine surgery), chronic myelopathy left leg (patient reports started after aforementioned C-spine surgery 06/2022). Pt had prior C4-6 ACDF in 2019 with complications requiring additional C4-7 allograft surgery. Still has weakness in his left leg, loses his balance and falls. He is walking with a cane now which helps. Pt reports some R upper limb motor control and fine motor limitation.    Pt reports some L-sided weakness and imbalance associated with this. He states it feels like I had a stroke, but I didn't have a stroke. He reports notable pain with sustained flexion activities e.g. dishes and bathing dog. Pt refrains from heavy lifting.   He reports limitation with LLE dorsiflexing/everting. Pt reports having to use railing/wall for stairs; difficulty with ascent and descent.    PAIN:    Pain Intensity: Present: 1/10, Best: 1/10, Worst: 10/10 Pain location: Lower lumbar region Pain Quality: slow, tense; will worsen with aggravating activities; axial low back; no notable sciatic-type symptoms or LE referred symptoms Radiating: No  Numbness/Tingling: Some numbness in R 4th-5th digits, no LE N/T reported  Focal Weakness: Yes, R hand from cervical myelopathy, L-sided weakness and difficulty lifting L foot  Aggravating factors: sustained forward lean activities  Relieving factors: leaning backward (sitting or standing), reclined sitting position, lying on back with deep breathing; sit to stand; prolonged standing 24-hour pain behavior: worse with specific activities How long can you sit: How long can you stand: History of prior back injury, pain, surgery, or therapy: No Dominant hand: right; pt uses L hand more now due to R hand impairment   Imaging: Yes   IMPRESSION: 1. No acute osseous or ligamentous findings in the spine. No clear explanation for the patient's symptoms. 2. Symmetric foci of T2 hyperintensity in the cord at C5. Given proximity to the prior  cervical fusion, this is likely the sequela of remote compressive myelopathy. No cord edema or expansion. The conus medullaris and cauda equina appear normal. 3. Multilevel cervical spondylosis post anterior discectomy and fusion at C5-6. Mild spinal stenosis at C4-5. Multilevel cervical foraminal narrowing as detailed above. 4. Mild thoracic spine degenerative changes with small disc protrusions at T3-4 and  T4-5. No cord deformity or foraminal compromise. 5. Moderate-sized left paracentral disc protrusion at L4-5 with mass effect on the thecal sac and possible left L5 nerve root encroachment.   Red flags: Negative for bowel/bladder changes, saddle paresthesia, personal history of cancer, h/o spinal tumors, h/o compression fx, h/o abdominal aneurysm, abdominal pain, chills/fever, night sweats, nausea, vomiting, unrelenting pain, first onset of insidious LBP <20 y/o  PRECAUTIONS: Fall  WEIGHT BEARING RESTRICTIONS: No  FALLS: Has patient fallen in last 6 months? Yes. Number of falls 3-4 falls each month  Living Environment Lives with: lives alone; sister lives close by in town; children are in [place] Lives in: House/apartment; 2-story apartment Stairs: Yes: Internal: 14 steps; on right going up Has following equipment at home: walking stick/cane  Prior level of function: Independent  Occupational demands: Out of work due to medical issues/injuries. Pt working on getting SS income   Hobbies: Pt enjoys bowling, video games, darts, playing pool  Patient Goals: Able to walk further, able to get legs to go   OBJECTIVE:  Patient Surveys  Modified Oswestry 16/50 = 32%    GAIT: Distance walked: 30 ft Assistive device utilized: pt uses SPC LUE (pt aware that this is not standard technique); completed gait trial with no AD for assessment Level of assistance: SBA Comments: Lateral trunk flexion during LLE swing phase to clear foot and dec stance time on LLE; decreased unipedal stance time L>R  Posture: Lumbar lordosis: Decreased Mild inc thoracic kyphosis Iliac crest height: Equal bilaterally Lumbar lateral shift: Negative  AROM AROM (Normal range in degrees) AROM  05/07/24 AROM 06/07/24  Lumbar    Flexion (65) 50%* 50%  Extension (30) 50%* (more pain with extension) 50%*  Right lateral flexion (25) 50% 75%  Left lateral flexion (25) 50%* 75%  Right rotation (30) WFL WFL  Left  rotation (30) WFL WFL       Hip Right Left   Flexion (125)     Extension (15)     Abduction (40)     Adduction      Internal Rotation (45)     External Rotation (45)          (* = pain; Blank rows = not tested)  LE MMT: MMT (out of 5) Right 05/07/24 Left 05/07/24 Right 06/07/24 Left 06/07/24  Hip flexion 4 4- 4+ 4-  Hip extension 4- 4-    Hip abduction      Hip adduction      Hip internal rotation      Hip external rotation      Knee flexion 4+ 4 5 5-  Knee extension 5 4+ 5 5-  Ankle dorsiflexion 5 4- 5 4  Ankle plantarflexion      Ankle inversion      Ankle eversion      (* = pain; Blank rows = not tested)  Sensation Grossly intact to light touch throughout bilateral LEs with exception of L great toe. Proprioception, stereognosis, and hot/cold testing deferred on this date.  Reflexes R/L Bracioradialis: 2+/2+ Biceps: 1+/1+ Triceps: Unable to obtain/2+ Knee Jerk (L3/4): 3+/3+  Ankle Jerk (S1/2): 2+/2+  Clonus: Negative (2-3 beats per side) Hoffman's Sign: Negative  Muscle Length Hamstrings: R: Positive L: Positive Ely (quadriceps): R: Not examined L: Not examined   Palpation Location Right Left         Lumbar paraspinals 1 2  Quadratus Lumborum 0 0  Gluteus Maximus 1 1  Gluteus Medius 1 1  Deep hip external rotators    PSIS    Fortin's Area (SIJ)    Greater Trochanter    (Blank rows = not tested) Graded on 0-4 scale (0 = no pain, 1 = pain, 2 = pain with wincing/grimacing/flinching, 3 = pain with withdrawal, 4 = unwilling to allow palpation)  Passive Accessory Intervertebral Motion (updated 05/09/24) Hypomobile L3-L5; pain at restriction   Special Tests Lumbar Radiculopathy and Discogenic: Centralization and Peripheralization (SN 92, -LR 0.12): Repeated extension in lying; mild axial pain afterward, no c/o of increased pain after brief rest period (will need further assessment Slump (SN 83, -LR 0.32): R: Negative L: Negative SLR (SN 92, -LR 0.29): R:  Negative L:  Negative  Facet Joint: Extension-Rotation (SN 100, -LR 0.0): R: Not examined L: Not examined  Lumbar Foraminal Stenosis: Lumbar quadrant (SN 70): R: Negative L: Positive (pain felt on R paraspinal)     TODAY'S TREATMENT: DATE: 06/11/2024    SUBJECTIVE STATEMENT:   Pt reports 4/10 pain affecting axial low back - same symptoms at arrival in low back and also in regard to dizziness. Pt reports some intermittent dizziness with performance of repeated extension in lying/prone press-up.    Manual Therapy - for symptom modulation, soft tissue sensitivity and mobility, joint mobility, ROM    STM along iliocostalis and longissimus lumborum L2-5 bilaterally; x 12 minutes       Therapeutic Exercise - for improved soft tissue flexibility and extensibility as needed for ROM, improved strength as needed to improve performance of CKC activities/functional movements   NuStep; Level 5, x 5 minutes - for improved soft tissue mobility and increased tissue temperature to improve muscle performance   -subjective gathered during this time  Lower trunk rotations; 1 x 10, 2 sec hold at end-range  Cat Camel;  1 x 10 alternating up/down Bird dog; 1 x 10 alternating R/L   PATIENT EDUCATION: Discussed potential use of dry needling to treat axial pain if other conservative measures have suboptimal progress. Discussed upcoming f/u with vestibular PT and focusing on this condition in office until pt is ready to resume POC for low back. Discussed continuation of HEP.     *not today* Quadruped side bend; 1 x 10 alternating R/L Open book; 1 x 10 on each side   -intermittent dizziness with rolling to L and completing open book in L sidelying Dying bug; hooklying for starting position; 1 x 10, alt Extension with forearms on wall; 1 x 10      PATIENT EDUCATION:  Education details: see above for patient education details Person educated: Patient Education method: Explanation,  Demonstration, and Handouts Education comprehension: verbalized understanding   HOME EXERCISE PROGRAM:  Access Code: [email or web address] URL: [email or web address] Date: 05/09/2024 Prepared by: Venetia Endo  Exercises - Prone Press Up  - 5-6 x daily - 7 x weekly - 1 sets - 10 reps - 1sec hold - Supine Hamstring Stretch  - 2 x daily - 7 x weekly - 2 sets - 10 reps - 1sec hold - Supine Lower Trunk Rotation  - 2 x daily - 7 x weekly - 2 sets - 10 reps - Supine Bridge  -  1 x daily - 7 x weekly - 2 sets - 10 reps - Standing Toe Raises at Chair  - 2 x daily - 7 x weekly - 2 sets - 10 reps   ASSESSMENT:  CLINICAL IMPRESSION: Data/goal update from last visit has been carried forward for today's progress note. Patient has participated very well with PT, but he has ongoing gait changes with complex Hx including previous cervical myelopathy after complication from cervical fusion requiring further C-spine Sx. Motor deficits in LLE have improved modestly per testing, but pt has ongoing gait changes complicated by aforementioned CNS history and chronic back pain. Pt has ongoing axial back pain usually 4/10 at baseline without sciatica or LE referred pain. Pt has comorbid dizziness/positional vertigo and new referral for BPPV; pt is scheduled for evaluation with vestibular PT 06/14/24. Pt can resume plan of care focused on low back and strength/motor deficits following this evaluation and vestibular treatment. Pt has remaining deficits in: thoracolumbar AROM, posterior chain/hamstrings decreased flexibility, L>R lower limb weakness and apparent L5 myotomal deficit, associated gait changes, and axial pain affecting lower lumbar region. Pt will continue to benefit from skilled PT services to address deficits and improve function.   OBJECTIVE IMPAIRMENTS: Abnormal gait, decreased balance, difficulty walking, decreased ROM, decreased strength, hypomobility, impaired flexibility, postural dysfunction, and  pain.   ACTIVITY LIMITATIONS: carrying, lifting, bending, sitting, standing, squatting, stairs, transfers, and locomotion level  PARTICIPATION LIMITATIONS: meal prep, cleaning, driving, and community activity  PERSONAL FACTORS: Past/current experiences, Time since onset of injury/illness/exacerbation, and 3+ comorbidities: (HTN, OA, anxiety, depression, Hx of ACDF and cervical myelopathy)are also affecting patient's functional outcome.   REHAB POTENTIAL: Good  CLINICAL DECISION MAKING: Unstable/unpredictable  EVALUATION COMPLEXITY: High   GOALS: Goals reviewed with patient? Yes  SHORT TERM GOALS: Target date: 05/29/2024  Pt will be independent with HEP in order to improve strength and decrease back pain to improve pain-free function at home and work. Baseline: 05/07/24: Baseline HEP initiated.    06/07/24: Pt reports intermittent compliance with his HEP, keeps up with Med[employer] GO app most days.  Goal status: ACHIEVED   2.  Pt will decrease worst back pain by at least 2 points on the NPRS in order to demonstrate clinically significant reduction in back pain. Baseline: 05/07/24: 10/10 pain at worst.   06/07/24: 7/10 at worst.  Goal status: ACHIEVED   LONG TERM GOALS: Target date: 07/05/2024  Pt will decrease mODI score by at least 13 points in order demonstrate clinically significant reduction in back pain/disability.    Baseline: 05/07/24: 32%      06/07/24: 34% Goal status: NOT MET   2.  Pt will decrease worst back pain to no more than 2-3/10 on the NPRS in order to demonstrate clinically significant reduction in back pain. Baseline: 05/07/24: 10/10 at worst.     06/07/24: 7/10 at worst.  Goal status: IN PROGRESS  3.  Patient will have full thoracolumbar AROM without reproduction of pain as needed for reaching items on ground, household chores, bending.  Baseline: 05/07/24: Motion loss and pain with flexion/extension, L lateral flexion. Motion loss R lateral flexion.    06/07/24: Improved  tolerance of forward flexion, motion loss remaining with flexion/extension and R/L lateral flexion.  Goal status: ON-GOING  4.  Patient will demonstrate safe negotiation of flight of stairs with R handrail support without reproduction of pain and adequate toe clearance on each step as needed for getting to upstairs bedroom in his apartment.  Baseline: 05/07/24: Notable  difficulty with stair negotiation.    06/07/24: Deferred Goal status: DEFERRED  5.  Patient will improve L lower limb MMTs to 4+/5 or greater indicative of improved strength as needed for lower limb lifting during self-care ADLs and adequate dorsiflexor strength for loading response during gait cycle and toe clearance during LLE swing.  Baseline: 05/07/24: MMTs 4- to 4 with exception of srong L quadriceps.   06/07/24: Remaining weakness L hip flexors and L ankle dorsiflexors Goal status: IN PROGRESS   PLAN: PT FREQUENCY: 1-2x/week  PT DURATION: 4 weeks, pending vestibular evaluation 06/14/24  PLANNED INTERVENTIONS: Therapeutic exercises, Therapeutic activity, Neuromuscular re-education, Balance training, Gait training, Patient/Family education, Self Care, Joint mobilization, Joint manipulation, Vestibular training, Canalith repositioning, Orthotic/Fit training, DME instructions, Dry Needling, Electrical stimulation, Spinal manipulation, Spinal mobilization, Cryotherapy, Moist heat, Taping, Traction, Ultrasound, Ionotophoresis 4mg /ml Dexamethasone , Manual therapy, and Re-evaluation.  PLAN FOR NEXT SESSION: Repeated extension in lying and manual therapy for desensitization of longissimus lumborum at L3-5 levels. Continue with graded movement and trunk/hip strengthening as able. Continue plan of care pending vestibular eval 06/14/24 (pt may hold on lumbar spine PT while this is being treated).   Venetia Endo, PT, DPT #[id number]  Venetia ONEIDA Endo, PT 06/11/2024, 9:15 AM

## 2024-06-14 ENCOUNTER — Ambulatory Visit

## 2024-06-14 DIAGNOSIS — R42 Dizziness and giddiness: Secondary | ICD-10-CM

## 2024-06-14 DIAGNOSIS — M5459 Other low back pain: Secondary | ICD-10-CM | POA: Diagnosis not present

## 2024-06-14 NOTE — Therapy (Signed)
 OUTPATIENT PHYSICAL THERAPY TREATMENT/VESTIBULAR ASSESSMENT   Patient Name: Tyler Winters MRN: [id number] DOB:Oct 12, 1966, 58 y.o., male Today's Date: 06/16/2024  END OF SESSION:  PT End of Session - 06/16/24 0954     Visit Number 11    Number of Visits 17    Date for PT Re-Evaluation 07/05/24    PT Start Time 1101    PT Stop Time 1145    PT Time Calculation (min) 44 min    Activity Tolerance Patient limited by pain    Behavior During Therapy [facility] for tasks assessed/performed         Past Medical History:  Diagnosis Date   Allergy    Arthritis    Hypertension    Past Surgical History:  Procedure Laterality Date   COLON SURGERY     SPINE SURGERY     Patient Active Problem List   Diagnosis Date Noted   Benign positional vertigo 06/06/2024   Hyperlipidemia, mixed 03/27/2024   Healthcare maintenance 03/27/2024   Lumbar radiculopathy, chronic 01/18/2024   Anxiety and depression 01/18/2024   Hypertension 01/18/2024   Left leg weakness 01/09/2021   Nicotine dependence 01/09/2021   PCP: Ziglar, Susan K, MD  REFERRING PROVIDER: Ziglar, Susan K, MD  REFERRING DIAG: M54.16 (ICD-10-CM) - Chronic left-sided lumbar radiculopathy   RATIONALE FOR EVALUATION AND TREATMENT: Rehabilitation  THERAPY DIAG: Dizziness and giddiness  ONSET DATE: 2023 around time of Sx  FOLLOW-UP APPT SCHEDULED WITH REFERRING PROVIDER: Yes ; next f/u with PCP 05/08/24  PERTINENT HISTORY: Pt is a 58 year old male referred for chronic L-sided lumbar radiculopathy.  HX of DDD of the cervical spine (C4-C7, s/p allograft spine surgery), chronic myelopathy left leg (patient reports started after aforementioned C-spine surgery 06/2022). Pt had prior C4-6 ACDF in 2019 with complications requiring additional C4-7 allograft surgery. Still has weakness in his left leg, loses his balance and falls. He is walking with a cane now which helps. Pt reports some R upper limb motor control and fine motor  limitation.   Pt reports some L-sided weakness and imbalance associated with this. He states it feels like I had a stroke, but I didn't have a stroke. He reports notable pain with sustained flexion activities e.g. dishes and bathing dog. Pt refrains from heavy lifting.   He reports limitation with LLE dorsiflexing/everting. Pt reports having to use railing/wall for stairs; difficulty with ascent and descent.    PAIN:    Pain Intensity: Present: 1/10, Best: 1/10, Worst: 10/10 Pain location: Lower lumbar region Pain Quality: slow, tense; will worsen with aggravating activities; axial low back; no notable sciatic-type symptoms or LE referred symptoms Radiating: No  Numbness/Tingling: Some numbness in R 4th-5th digits, no LE N/T reported  Focal Weakness: Yes, R hand from cervical myelopathy, L-sided weakness and difficulty lifting L foot  Aggravating factors: sustained forward lean activities  Relieving factors: leaning backward (sitting or standing), reclined sitting position, lying on back with deep breathing; sit to stand; prolonged standing 24-hour pain behavior: worse with specific activities How long can you sit: How long can you stand: History of prior back injury, pain, surgery, or therapy: No Dominant hand: right; pt uses L hand more now due to R hand impairment   Imaging: Yes   IMPRESSION: 1. No acute osseous or ligamentous findings in the spine. No clear explanation for the patient's symptoms. 2. Symmetric foci of T2 hyperintensity in the cord at C5. Given proximity to the prior cervical fusion, this is likely the sequela of remote  compressive myelopathy. No cord edema or expansion. The conus medullaris and cauda equina appear normal. 3. Multilevel cervical spondylosis post anterior discectomy and fusion at C5-6. Mild spinal stenosis at C4-5. Multilevel cervical foraminal narrowing as detailed above. 4. Mild thoracic spine degenerative changes with small disc protrusions  at T3-4 and T4-5. No cord deformity or foraminal compromise. 5. Moderate-sized left paracentral disc protrusion at L4-5 with mass effect on the thecal sac and possible left L5 nerve root encroachment.   Red flags: Negative for bowel/bladder changes, saddle paresthesia, personal history of cancer, h/o spinal tumors, h/o compression fx, h/o abdominal aneurysm, abdominal pain, chills/fever, night sweats, nausea, vomiting, unrelenting pain, first onset of insidious LBP <20 y/o  PRECAUTIONS: Fall  WEIGHT BEARING RESTRICTIONS: No  FALLS: Has patient fallen in last 6 months? Yes. Number of falls 3-4 falls each month  Living Environment Lives with: lives alone; sister lives close by in town; children are in [place] Lives in: House/apartment; 2-story apartment Stairs: Yes: Internal: 14 steps; on right going up Has following equipment at home: walking stick/cane  Prior level of function: Independent  Occupational demands: Out of work due to medical issues/injuries. Pt working on getting SS income   Hobbies: Pt enjoys bowling, video games, darts, playing pool  Patient Goals: Able to walk further, able to get legs to go   OBJECTIVE:  Patient Surveys  Modified Oswestry 16/50 = 32%    GAIT: Distance walked: 30 ft Assistive device utilized: pt uses SPC LUE (pt aware that this is not standard technique); completed gait trial with no AD for assessment Level of assistance: SBA Comments: Lateral trunk flexion during LLE swing phase to clear foot and dec stance time on LLE; decreased unipedal stance time L>R  Posture: Lumbar lordosis: Decreased Mild inc thoracic kyphosis Iliac crest height: Equal bilaterally Lumbar lateral shift: Negative  AROM AROM (Normal range in degrees) AROM  05/07/24 AROM 06/07/24  Lumbar    Flexion (65) 50%* 50%  Extension (30) 50%* (more pain with extension) 50%*  Right lateral flexion (25) 50% 75%  Left lateral flexion (25) 50%* 75%  Right rotation (30) WFL  WFL  Left rotation (30) WFL WFL       Hip Right Left   Flexion (125)     Extension (15)     Abduction (40)     Adduction      Internal Rotation (45)     External Rotation (45)          (* = pain; Blank rows = not tested)  LE MMT: MMT (out of 5) Right 05/07/24 Left 05/07/24 Right 06/07/24 Left 06/07/24  Hip flexion 4 4- 4+ 4-  Hip extension 4- 4-    Hip abduction      Hip adduction      Hip internal rotation      Hip external rotation      Knee flexion 4+ 4 5 5-  Knee extension 5 4+ 5 5-  Ankle dorsiflexion 5 4- 5 4  Ankle plantarflexion      Ankle inversion      Ankle eversion      (* = pain; Blank rows = not tested)  Sensation Grossly intact to light touch throughout bilateral LEs with exception of L great toe. Proprioception, stereognosis, and hot/cold testing deferred on this date.  Reflexes R/L Bracioradialis: 2+/2+ Biceps: 1+/1+ Triceps: Unable to obtain/2+ Knee Jerk (L3/4): 3+/3+  Ankle Jerk (S1/2): 2+/2+  Clonus: Negative (2-3 beats per side) Hoffman's Sign: Negative  Muscle Length Hamstrings: R: Positive L: Positive Ely (quadriceps): R: Not examined L: Not examined  Palpation Location Right Left         Lumbar paraspinals 1 2  Quadratus Lumborum 0 0  Gluteus Maximus 1 1  Gluteus Medius 1 1  Deep hip external rotators    PSIS    Fortin's Area (SIJ)    Greater Trochanter    (Blank rows = not tested) Graded on 0-4 scale (0 = no pain, 1 = pain, 2 = pain with wincing/grimacing/flinching, 3 = pain with withdrawal, 4 = unwilling to allow palpation)  Passive Accessory Intervertebral Motion (updated 05/09/24) Hypomobile L3-L5; pain at restriction   Special Tests Lumbar Radiculopathy and Discogenic: Centralization and Peripheralization (SN 92, -LR 0.12): Repeated extension in lying; mild axial pain afterward, no c/o of increased pain after brief rest period (will need further assessment Slump (SN 83, -LR 0.32): R: Negative L: Negative SLR (SN 92, -LR  0.29): R: Negative L:  Negative  Facet Joint: Extension-Rotation (SN 100, -LR 0.0): R: Not examined L: Not examined  Lumbar Foraminal Stenosis: Lumbar quadrant (SN 70): R: Negative L: Positive (pain felt on R paraspinal)     TODAY'S TREATMENT: DATE: 06/14/2024   SUBJECTIVE:    Chief Complaint:  Vertigo and lightheadedness   Pertinent History Pt reports that he has been experiencing vertigo since he started taking Lyrica  for his back pain over a month ago. The dizziness occurs with position changes, quick turns, and rolling over in bed. Recently he experienced a fall during which he injured his hand. He describes making a fist as he fell, resulting in pain in his hand and thumb. PMH includes DDD of the cervical spine (C4-C7, s/p allograft spine surgery), chronic myelopathy left leg (patient reports started after surgery 06/2022), and chronic lumbar pain and lumbar radiculopathy left leg.     Description of dizziness: vertigo and lightheadedness Frequency: with position changes on a daily basis Duration: seconds Symptom nature: intermittent and positional Progression of symptoms since onset: unchanged History of similar episodes: None  Provocative Factors: rolling, position changes, quick turns Easing Factors: waiting for symptoms to pass;  Auditory complaints (tinnitus, pain, drainage, hearing loss, aural fullness): Yes, occasional tinnitus bilaterally. Intermittent L aural fullness; Vision changes (diplopia, visual field loss, recent changes, recent eye exam): No Chest pain/palpitations: No History of head injury/concussion: Yes, concussion 3.5 years ago;  Stress/anxiety: Yes, marital stress Migraines/headaches: Yes, pt reports history of migraines and headaches Nausea/vomiting: No Numbness/tingling: Yes, RUE tingling Focal weakness: Yes Dysarthria/dysphagia/drop attacks: No  Pertinent pain: yes, low back pain;  OBJECTIVE EXAMINATION  POSTURE: No gross deficits  contributing to symptoms  COORDINATION Finger to Nose: Mild RUE dysmetria Heel to Shin: Normal Pronator Drift: Positive RUE drift Rapid Alternating Movements: Normal Finger to Thumb Opposition: Normal   RANGE OF MOTION Cervical Spine AROM is severely limited in all planes, especially extension. No functional focal deficits in AROM noted in BUE/BLE  MANUAL MUSCLE TESTING BUE/BLE strength WNL without gross focal deficits  TRANSFERS/GAIT Independent for transfers and ambulation with cane  OCULOMOTOR / VESTIBULAR TESTING  Oculomotor Exam- Room Light  Findings Comments  Ocular Alignment normal   Ocular ROM normal   Spontaneous Nystagmus normal   Gaze-Holding Nystagmus normal   End-Gaze Nystagmus normal   Vergence (normal 2-3) not examined   Smooth Pursuit normal   Cross-Cover Test not examined   Saccades normal   VOR Cancellation normal   Left Head Impulse normal   Right Head  Impulse normal   Test of Skew normal   Static Acuity not examined   Dynamic Acuity not examined    Oculomotor Exam- Fixation Suppressed  Findings Comments  Ocular Alignment normal   Spontaneous Nystagmus abnormal Pure horizontal R beating nystagmus  Gaze-Holding Nystagmus abnormal Pure horizontal R beating nystagmus which worsens with R gaze  End-Gaze Nystagmus abnormal See above   Head Shaking Nystagmus abnormal   Pressure-Induced Nystagmus not examined   Hyperventilation Induced Nystagmus not examined   Skull Vibration Induced Nystagmus abnormal Pure horizontal R beating nystagmus with vibration on mastoids bilaterally    BPPV TESTS:  Symptoms Duration Intensity Nystagmus  L Dix-Hallpike None   None  R Dix-Hallpike None   None  L Head Roll None   None  R Head Roll None   None  L Sidelying Test None   None  R Sidelying Test None   None  (blank = not tested)  Clinical Test of Sensory Interaction for Balance (CTSIB): Deferred  Orthostatic vitals: Supine: BP: 131/87 mmHg, HR: 64 bpm, SpO2:  98% Seated: BP: 134/92 mmHg, HR: 70 bpm, SpO2: 100% Standing (1 minute): BP: 136/99 mmHg, HR: 69 bpm, SpO2: 97%  PATIENT EDUCATION:  Education details: need for ENT consult Person educated: Patient Education method: Explanation Education comprehension: verbalized understanding   HOME EXERCISE PROGRAM:  Access Code: [email or web address] URL: [email or web address] Date: 05/09/2024 Prepared by: Venetia Endo  Exercises - Prone Press Up  - 5-6 x daily - 7 x weekly - 1 sets - 10 reps - 1sec hold - Supine Hamstring Stretch  - 2 x daily - 7 x weekly - 2 sets - 10 reps - 1sec hold - Supine Lower Trunk Rotation  - 2 x daily - 7 x weekly - 2 sets - 10 reps - Supine Bridge  - 1 x daily - 7 x weekly - 2 sets - 10 reps - Standing Toe Raises at Chair  - 2 x daily - 7 x weekly - 2 sets - 10 reps   ASSESSMENT:  CLINICAL IMPRESSION: Performed vestibular assessment for patient at the request of the primary therapist. While his subjective report sounds somewhat indicative of BPPV all positional testing today is negative. History is somewhat disjointed and pt has difficulty clearly describing his subjective sensations. Fixations suppression testing reveals pure horizontal R beating nystagmus which worsens when performing skull vibration. Symptoms are most consistent with possible UVH. Recommend ENT consult for further assessment. Given limited cervical mobility pt may not tolerate gaze stabilization exercises. Pt advised to continue therapy for low back pain and POC to be further determined following ENT consult. Pt will benefit from PT services to address deficits in low back pain and weakness in order to improve function at home.    OBJECTIVE IMPAIRMENTS: Abnormal gait, decreased balance, difficulty walking, decreased ROM, decreased strength, hypomobility, impaired flexibility, postural dysfunction, and pain.   ACTIVITY LIMITATIONS: carrying, lifting, bending, sitting, standing, squatting, stairs,  transfers, and locomotion level  PARTICIPATION LIMITATIONS: meal prep, cleaning, driving, and community activity  PERSONAL FACTORS: Past/current experiences, Time since onset of injury/illness/exacerbation, and 3+ comorbidities: (HTN, OA, anxiety, depression, Hx of ACDF and cervical myelopathy)are also affecting patient's functional outcome.   REHAB POTENTIAL: Good  CLINICAL DECISION MAKING: Unstable/unpredictable  EVALUATION COMPLEXITY: High   GOALS: Goals reviewed with patient? Yes  SHORT TERM GOALS: Target date: 05/29/2024  Pt will be independent with HEP in order to improve strength and decrease back pain to improve pain-free function at home and work.  Baseline: 05/07/24: Baseline HEP initiated.    06/07/24: Pt reports intermittent compliance with his HEP, keeps up with MedBridge GO app most days.  Goal status: ACHIEVED   2.  Pt will decrease worst back pain by at least 2 points on the NPRS in order to demonstrate clinically significant reduction in back pain. Baseline: 05/07/24: 10/10 pain at worst.   06/07/24: 7/10 at worst.  Goal status: ACHIEVED   LONG TERM GOALS: Target date: 07/05/2024  Pt will decrease mODI score by at least 13 points in order demonstrate clinically significant reduction in back pain/disability.    Baseline: 05/07/24: 32%      06/07/24: 34% Goal status: NOT MET   2.  Pt will decrease worst back pain to no more than 2-3/10 on the NPRS in order to demonstrate clinically significant reduction in back pain. Baseline: 05/07/24: 10/10 at worst.     06/07/24: 7/10 at worst.  Goal status: IN PROGRESS  3.  Patient will have full thoracolumbar AROM without reproduction of pain as needed for reaching items on ground, household chores, bending.  Baseline: 05/07/24: Motion loss and pain with flexion/extension, L lateral flexion. Motion loss R lateral flexion.    06/07/24: Improved tolerance of forward flexion, motion loss remaining with flexion/extension and R/L lateral  flexion.  Goal status: ON-GOING  4.  Patient will demonstrate safe negotiation of flight of stairs with R handrail support without reproduction of pain and adequate toe clearance on each step as needed for getting to upstairs bedroom in his apartment.  Baseline: 05/07/24: Notable difficulty with stair negotiation.    06/07/24: Deferred Goal status: DEFERRED  5.  Patient will improve L lower limb MMTs to 4+/5 or greater indicative of improved strength as needed for lower limb lifting during self-care ADLs and adequate dorsiflexor strength for loading response during gait cycle and toe clearance during LLE swing.  Baseline: 05/07/24: MMTs 4- to 4 with exception of srong L quadriceps.   06/07/24: Remaining weakness L hip flexors and L ankle dorsiflexors Goal status: IN PROGRESS   PLAN: PT FREQUENCY: 1-2x/week  PT DURATION: 4 weeks, pending vestibular evaluation 06/14/24  PLANNED INTERVENTIONS: Therapeutic exercises, Therapeutic activity, Neuromuscular re-education, Balance training, Gait training, Patient/Family education, Self Care, Joint mobilization, Joint manipulation, Vestibular training, Canalith repositioning, Orthotic/Fit training, DME instructions, Dry Needling, Electrical stimulation, Spinal manipulation, Spinal mobilization, Cryotherapy, Moist heat, Taping, Traction, Ultrasound, Ionotophoresis 4mg /ml Dexamethasone , Manual therapy, and Re-evaluation.  PLAN FOR NEXT SESSION: Repeated extension in lying and manual therapy for desensitization of longissimus lumborum at L3-5 levels. Continue with graded movement and trunk/hip strengthening as able. Continue plan of care pending vestibular eval 06/14/24 (pt may hold on lumbar spine PT while this is being treated).   Selinda BIRCH Jhair Witherington PT, DPT, GCS  Trixie Maclaren, PT 06/16/2024, 10:02 AM

## 2024-06-19 ENCOUNTER — Ambulatory Visit: Admitting: Physical Therapy

## 2024-06-19 NOTE — Therapy (Incomplete)
 OUTPATIENT PHYSICAL THERAPY TREATMENT   Patient Name: Tyler Winters MRN: [id number] DOB:1966-01-01, 58 y.o., male Today's Date: 06/19/2024   END OF SESSION:    Past Medical History:  Diagnosis Date   Allergy    Arthritis    Hypertension    Past Surgical History:  Procedure Laterality Date   COLON SURGERY     SPINE SURGERY     Patient Active Problem List   Diagnosis Date Noted   Benign positional vertigo 06/06/2024   Hyperlipidemia, mixed 03/27/2024   Healthcare maintenance 03/27/2024   Lumbar radiculopathy, chronic 01/18/2024   Anxiety and depression 01/18/2024   Hypertension 01/18/2024   Left leg weakness 01/09/2021   Nicotine dependence 01/09/2021    PCP: Ziglar, Susan K, MD  REFERRING PROVIDER: Ziglar, Susan K, MD  REFERRING DIAG: M54.16 (ICD-10-CM) - Chronic left-sided lumbar radiculopathy   RATIONALE FOR EVALUATION AND TREATMENT: Rehabilitation  THERAPY DIAG: Other low back pain  Muscle weakness (generalized)  Difficulty in walking, not elsewhere classified  ONSET DATE: 2023 around time of Sx  FOLLOW-UP APPT SCHEDULED WITH REFERRING PROVIDER: Yes ; next f/u with PCP 05/08/24  PERTINENT HISTORY: Pt is a 58 year old male referred for chronic L-sided lumbar radiculopathy.  HX of DDD of the cervical spine (C4-C7, s/p allograft spine surgery), chronic myelopathy left leg (patient reports started after aforementioned C-spine surgery 06/2022). Pt had prior C4-6 ACDF in 2019 with complications requiring additional C4-7 allograft surgery. Still has weakness in his left leg, loses his balance and falls. He is walking with a cane now which helps. Pt reports some R upper limb motor control and fine motor limitation.   Pt reports some L-sided weakness and imbalance associated with this. He states it feels like I had a stroke, but I didn't have a stroke. He reports notable pain with sustained flexion activities e.g. dishes and bathing dog. Pt refrains from  heavy lifting.   He reports limitation with LLE dorsiflexing/everting. Pt reports having to use railing/wall for stairs; difficulty with ascent and descent.    PAIN:    Pain Intensity: Present: 1/10, Best: 1/10, Worst: 10/10 Pain location: Lower lumbar region Pain Quality: slow, tense; will worsen with aggravating activities; axial low back; no notable sciatic-type symptoms or LE referred symptoms Radiating: No  Numbness/Tingling: Some numbness in R 4th-5th digits, no LE N/T reported  Focal Weakness: Yes, R hand from cervical myelopathy, L-sided weakness and difficulty lifting L foot  Aggravating factors: sustained forward lean activities  Relieving factors: leaning backward (sitting or standing), reclined sitting position, lying on back with deep breathing; sit to stand; prolonged standing 24-hour pain behavior: worse with specific activities How long can you sit: How long can you stand: History of prior back injury, pain, surgery, or therapy: No Dominant hand: right; pt uses L hand more now due to R hand impairment   Imaging: Yes   IMPRESSION: 1. No acute osseous or ligamentous findings in the spine. No clear explanation for the patient's symptoms. 2. Symmetric foci of T2 hyperintensity in the cord at C5. 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 No cord edema or expansion. The conus medullaris and cauda equina appear normal. 3. Multilevel cervical spondylosis post anterior discectomy and fusion at C5-6. Mild spinal stenosis at C4-5. Multilevel cervical foraminal narrowing as detailed above. 4. Mild thoracic spine degenerative changes with small disc protrusions at T3-4 and T4-5. No cord deformity or foraminal compromise. 5. Moderate-sized left paracentral disc protrusion  at L4-5 with mass effect on the thecal sac and possible left L5 nerve root encroachment.   Red flags: Negative for bowel/bladder changes,  saddle paresthesia, personal history of cancer, h/o spinal tumors, h/o compression fx, h/o abdominal aneurysm, abdominal pain, chills/fever, night sweats, nausea, vomiting, unrelenting pain, first onset of insidious LBP <20 y/o  PRECAUTIONS: Fall  WEIGHT BEARING RESTRICTIONS: No  FALLS: Has patient fallen in last 6 months? Yes. Number of falls 3-4 falls each month  Living Environment Lives with: lives alone; sister lives close by in town; children are in [place] Lives in: House/apartment; 2-story apartment Stairs: Yes: Internal: 14 steps; on right going up Has following equipment at home: walking stick/cane  Prior level of function: Independent  Occupational demands: Out of work due to medical issues/injuries. Pt working on getting SS income   Hobbies: Pt enjoys bowling, video games, darts, playing pool  Patient Goals: Able to walk further, able to get legs to go   OBJECTIVE:  Patient Surveys  Modified Oswestry 16/50 = 32%    GAIT: Distance walked: 30 ft Assistive device utilized: pt uses SPC LUE (pt aware that this is not standard technique); completed gait trial with no AD for assessment Level of assistance: SBA Comments: Lateral trunk flexion during LLE swing phase to clear foot and dec stance time on LLE; decreased unipedal stance time L>R  Posture: Lumbar lordosis: Decreased Mild inc thoracic kyphosis Iliac crest height: Equal bilaterally Lumbar lateral shift: Negative  AROM AROM (Normal range in degrees) AROM  05/07/24 AROM 06/07/24  Lumbar    Flexion (65) 50%* 50%  Extension (30) 50%* (more pain with extension) 50%*  Right lateral flexion (25) 50% 75%  Left lateral flexion (25) 50%* 75%  Right rotation (30) WFL WFL  Left rotation (30) WFL WFL       Hip Right Left   Flexion (125)     Extension (15)     Abduction (40)     Adduction      Internal Rotation (45)     External Rotation (45)          (* = pain; Blank rows = not tested)  LE MMT: MMT (out of 5)  Right 05/07/24 Left 05/07/24 Right 06/07/24 Left 06/07/24  Hip flexion 4 4- 4+ 4-  Hip extension 4- 4-    Hip abduction      Hip adduction      Hip internal rotation      Hip external rotation      Knee flexion 4+ 4 5 5-  Knee extension 5 4+ 5 5-  Ankle dorsiflexion 5 4- 5 4  Ankle plantarflexion      Ankle inversion      Ankle eversion      (* = pain; Blank rows = not tested)  Sensation Grossly intact to light touch throughout bilateral LEs with exception of L great toe. Proprioception, stereognosis, and hot/cold testing deferred on this date.  Reflexes R/L Bracioradialis: 2+/2+ Biceps: 1+/1+ Triceps: Unable to obtain/2+ Knee Jerk (L3/4): 3+/3+  Ankle Jerk (S1/2): 2+/2+  Clonus: Negative (2-3 beats per side) Hoffman's Sign: Negative  Muscle Length Hamstrings: R: Positive L: Positive Ely (quadriceps): R: Not examined L: Not examined   Palpation Location Right Left         Lumbar paraspinals 1 2  Quadratus Lumborum 0 0  Gluteus Maximus 1 1  Gluteus Medius 1 1  Deep hip external rotators    PSIS    Fortin's Area (SIJ)  Greater Trochanter    (Blank rows = not tested) Graded on 0-4 scale (0 = no pain, 1 = pain, 2 = pain with wincing/grimacing/flinching, 3 = pain with withdrawal, 4 = unwilling to allow palpation)  Passive Accessory Intervertebral Motion (updated 05/09/24) Hypomobile L3-L5; pain at restriction   Special Tests Lumbar Radiculopathy and Discogenic: Centralization and Peripheralization (SN 92, -LR 0.12): Repeated extension in lying; mild axial pain afterward, no c/o of increased pain after brief rest period (will need further assessment Slump (SN 83, -LR 0.32): R: Negative L: Negative SLR (SN 92, -LR 0.29): R: Negative L:  Negative  Facet Joint: Extension-Rotation (SN 100, -LR 0.0): R: Not examined L: Not examined  Lumbar Foraminal Stenosis: Lumbar quadrant (SN 70): R: Negative L: Positive (pain felt on R paraspinal)     TODAY'S  TREATMENT: DATE: 06/19/2024    SUBJECTIVE STATEMENT:   Pt reports 4/10 pain affecting axial low back - same symptoms at arrival in low back and also in regard to dizziness. Pt reports some intermittent dizziness with performance of repeated extension in lying/prone press-up.    Manual Therapy - for symptom modulation, soft tissue sensitivity and mobility, joint mobility, ROM    STM along iliocostalis and longissimus lumborum L2-5 bilaterally; x 12 minutes       Therapeutic Exercise - for improved soft tissue flexibility and extensibility as needed for ROM, improved strength as needed to improve performance of CKC activities/functional movements   NuStep; Level 5, x 5 minutes - for improved soft tissue mobility and increased tissue temperature to improve muscle performance   -subjective gathered during this time  Lower trunk rotations; 1 x 10, 2 sec hold at end-range  Cat Camel;  1 x 10 alternating up/down Bird dog; 1 x 10 alternating R/L   PATIENT EDUCATION: Discussed potential use of dry needling to treat axial pain if other conservative measures have suboptimal progress. Discussed upcoming f/u with vestibular PT and focusing on this condition in office until pt is ready to resume POC for low back. Discussed continuation of HEP.     *not today* Quadruped side bend; 1 x 10 alternating R/L Open book; 1 x 10 on each side   -intermittent dizziness with rolling to L and completing open book in L sidelying Dying bug; hooklying for starting position; 1 x 10, alt Extension with forearms on wall; 1 x 10      PATIENT EDUCATION:  Education details: see above for patient education details Person educated: Patient Education method: Explanation, Demonstration, and Handouts Education comprehension: verbalized understanding   HOME EXERCISE PROGRAM:  Access Code: [email or web address] URL: [email or web address] Date: 05/09/2024 Prepared by: Venetia Endo  Exercises - Prone  Press Up  - 5-6 x daily - 7 x weekly - 1 sets - 10 reps - 1sec hold - Supine Hamstring Stretch  - 2 x daily - 7 x weekly - 2 sets - 10 reps - 1sec hold - Supine Lower Trunk Rotation  - 2 x daily - 7 x weekly - 2 sets - 10 reps - Supine Bridge  - 1 x daily - 7 x weekly - 2 sets - 10 reps - Standing Toe Raises at Chair  - 2 x daily - 7 x weekly - 2 sets - 10 reps   ASSESSMENT:  CLINICAL IMPRESSION: Data/goal update from last visit has been carried forward for today's progress note. Patient has participated very well with PT, but he has ongoing gait changes with complex Hx including previous cervical myelopathy after  complication from cervical fusion requiring further C-spine Sx. Motor deficits in LLE have improved modestly per testing, but pt has ongoing gait changes complicated by aforementioned CNS history and chronic back pain. Pt has ongoing axial back pain usually 4/10 at baseline without sciatica or LE referred pain. Pt has comorbid dizziness/positional vertigo and new referral for BPPV; pt is scheduled for evaluation with vestibular PT 06/14/24. Pt can resume plan of care focused on low back and strength/motor deficits following this evaluation and vestibular treatment. Pt has remaining deficits in: thoracolumbar AROM, posterior chain/hamstrings decreased flexibility, L>R lower limb weakness and apparent L5 myotomal deficit, associated gait changes, and axial pain affecting lower lumbar region. Pt will continue to benefit from skilled PT services to address deficits and improve function.   OBJECTIVE IMPAIRMENTS: Abnormal gait, decreased balance, difficulty walking, decreased ROM, decreased strength, hypomobility, impaired flexibility, postural dysfunction, and pain.   ACTIVITY LIMITATIONS: carrying, lifting, bending, sitting, standing, squatting, stairs, transfers, and locomotion level  PARTICIPATION LIMITATIONS: meal prep, cleaning, driving, and community activity  PERSONAL FACTORS:  Past/current experiences, Time since onset of injury/illness/exacerbation, and 3+ comorbidities: (HTN, OA, anxiety, depression, Hx of ACDF and cervical myelopathy)are also affecting patient's functional outcome.   REHAB POTENTIAL: Good  CLINICAL DECISION MAKING: Unstable/unpredictable  EVALUATION COMPLEXITY: High   GOALS: Goals reviewed with patient? Yes  SHORT TERM GOALS: Target date: 05/29/2024  Pt will be independent with HEP in order to improve strength and decrease back pain to improve pain-free function at home and work. Baseline: 05/07/24: Baseline HEP initiated.    06/07/24: Pt reports intermittent compliance with his HEP, keeps up with Med[employer] GO app most days.  Goal status: ACHIEVED   2.  Pt will decrease worst back pain by at least 2 points on the NPRS in order to demonstrate clinically significant reduction in back pain. Baseline: 05/07/24: 10/10 pain at worst.   06/07/24: 7/10 at worst.  Goal status: ACHIEVED   LONG TERM GOALS: Target date: 07/05/2024  Pt will decrease mODI score by at least 13 points in order demonstrate clinically significant reduction in back pain/disability.    Baseline: 05/07/24: 32%      06/07/24: 34% Goal status: NOT MET   2.  Pt will decrease worst back pain to no more than 2-3/10 on the NPRS in order to demonstrate clinically significant reduction in back pain. Baseline: 05/07/24: 10/10 at worst.     06/07/24: 7/10 at worst.  Goal status: IN PROGRESS  3.  Patient will have full thoracolumbar AROM without reproduction of pain as needed for reaching items on ground, household chores, bending.  Baseline: 05/07/24: Motion loss and pain with flexion/extension, L lateral flexion. Motion loss R lateral flexion.    06/07/24: Improved tolerance of forward flexion, motion loss remaining with flexion/extension and R/L lateral flexion.  Goal status: ON-GOING  4.  Patient will demonstrate safe negotiation of flight of stairs with R handrail support without  reproduction of pain and adequate toe clearance on each step as needed for getting to upstairs bedroom in his apartment.  Baseline: 05/07/24: Notable difficulty with stair negotiation.    06/07/24: Deferred Goal status: DEFERRED  5.  Patient will improve L lower limb MMTs to 4+/5 or greater indicative of improved strength as needed for lower limb lifting during self-care ADLs and adequate dorsiflexor strength for loading response during gait cycle and toe clearance during LLE swing.  Baseline: 05/07/24: MMTs 4- to 4 with exception of srong L quadriceps.   06/07/24: Remaining weakness L  hip flexors and L ankle dorsiflexors Goal status: IN PROGRESS   PLAN: PT FREQUENCY: 1-2x/week  PT DURATION: 4 weeks, pending vestibular evaluation 06/14/24  PLANNED INTERVENTIONS: Therapeutic exercises, Therapeutic activity, Neuromuscular re-education, Balance training, Gait training, Patient/Family education, Self Care, Joint mobilization, Joint manipulation, Vestibular training, Canalith repositioning, Orthotic/Fit training, DME instructions, Dry Needling, Electrical stimulation, Spinal manipulation, Spinal mobilization, Cryotherapy, Moist heat, Taping, Traction, Ultrasound, Ionotophoresis 4mg /ml Dexamethasone , Manual therapy, and Re-evaluation.  PLAN FOR NEXT SESSION: Repeated extension in lying and manual therapy for desensitization of longissimus lumborum at L3-5 levels. Continue with graded movement and trunk/hip strengthening as able. Continue plan of care pending vestibular eval 06/14/24 (pt may hold on lumbar spine PT while this is being treated).   Venetia Endo, PT, DPT #[id number]  Venetia ONEIDA Endo, PT 06/19/2024, 8:50 AM

## 2024-06-21 ENCOUNTER — Encounter

## 2024-06-28 ENCOUNTER — Encounter

## 2024-07-05 ENCOUNTER — Encounter

## 2024-07-09 ENCOUNTER — Ambulatory Visit (INDEPENDENT_AMBULATORY_CARE_PROVIDER_SITE_OTHER): Admitting: Family Medicine

## 2024-07-09 ENCOUNTER — Encounter: Payer: Self-pay | Admitting: Family Medicine

## 2024-07-09 VITALS — BP 142/89 | HR 67 | Temp 98.1°F | Resp 20 | Ht 66.0 in | Wt 203.0 lb

## 2024-07-09 DIAGNOSIS — I10 Essential (primary) hypertension: Secondary | ICD-10-CM

## 2024-07-09 DIAGNOSIS — F321 Major depressive disorder, single episode, moderate: Secondary | ICD-10-CM | POA: Diagnosis not present

## 2024-07-09 DIAGNOSIS — M5416 Radiculopathy, lumbar region: Secondary | ICD-10-CM | POA: Diagnosis not present

## 2024-07-09 DIAGNOSIS — H814 Vertigo of central origin: Secondary | ICD-10-CM | POA: Diagnosis not present

## 2024-07-09 DIAGNOSIS — E782 Mixed hyperlipidemia: Secondary | ICD-10-CM

## 2024-07-09 MED ORDER — HYDROCHLOROTHIAZIDE 25 MG PO TABS: 25.0000 mg | ORAL_TABLET | Freq: Every day | ORAL | 3 refills | Status: AC

## 2024-07-09 MED ORDER — ROSUVASTATIN CALCIUM 10 MG PO TABS: 10.0000 mg | ORAL_TABLET | Freq: Every day | ORAL | 3 refills | Status: AC

## 2024-07-09 MED ORDER — AMLODIPINE BESYLATE 10 MG PO TABS: 10.0000 mg | ORAL_TABLET | Freq: Every day | ORAL | 1 refills | Status: AC

## 2024-07-09 MED ORDER — PREGABALIN 25 MG PO CAPS: 25.0000 mg | ORAL_CAPSULE | Freq: Two times a day (BID) | ORAL | 0 refills | Status: DC

## 2024-07-09 MED ORDER — SERTRALINE HCL 25 MG PO TABS: 25.0000 mg | ORAL_TABLET | Freq: Every day | ORAL | 3 refills | Status: DC

## 2024-07-09 NOTE — Progress Notes (Signed)
 Established Patient Office Visit  Subjective   Patient ID: Tyler Winters, male    DOB: 08-18-1966  Age: 58 y.o. MRN: [id number]  Chief Complaint  Patient presents with   Medical Management of Chronic Issues    HPI Delightful 58 year old male with a HX of DDD of the cervical spine (C4-C7, s/p allograft spine surgery), chronic myelopathy left leg, patient reports started after surgery 06/2022), chronic lumbar pain and lumbar radiculopathy left leg and dizziness.  Discussed the use of AI scribe software for clinical note transcription with the patient, who gave verbal consent to proceed.  History of Present Illness   Tyler Winters is a 58 year old male with hypertension who presents with medication management issues and dizziness.  He ran out of his prescribed amlodipine  and took his wife's blood pressure medication, losartan 100-hydrochlorothiazide  25mg  to manage his hypertension. His blood pressure was fluctuating between 150-160 mmHg before taking the medication, which brought it down to 123 mmHg. He also took extra hydrochlorothiazide  (HCTZ) during this period. He has been experiencing increased thirst, which he attributes to the medication.  He describes ongoing back pain and soreness, which he manages by being cautious with lifting. He stopped attending physical therapy and is awaiting further communication between his providers to determine the necessity of a CT scan for his dizziness.  He mentions a history of leg issues, stating 'my leg messed up, my back messed up,' and sometimes uses a cane for mobility. He expresses frustration with the Social Security process, feeling lost and unable to work due to his physical limitations.  He reports experiencing anxiety and depression, exacerbated by personal stressors at home. He describes feeling overwhelmed by his wife's criticisms and wants peace and quiet. We discussed starting an SSRI.  Current medications include  amlodipine , hydrochlorothiazide , aspirin, and rosuvastatin . He is out of Lyrica  (pregabalin ) for his back pain and is awaiting a refill.     ROS    Objective:     BP (!) 142/89   Pulse 67   Temp 98.1 F (36.7 C) (Oral)   Resp 20   Ht 5' 6 (1.676 m)   Wt 203 lb (92.1 kg)   SpO2 95%   BMI 32.77 kg/m    Physical Exam       No results found for any visits on 07/09/24.    The 10-year ASCVD risk score (Arnett DK, et al., 2019) is: 20.6%    Assessment & Plan:  Vertigo of central origin Assessment & Plan: Vestibular training is not working to relieve his dizziness.  Will obtrain a head CT without contrast.    Orders: -     CT HEAD WO CONTRAST ( ); Future  Primary hypertension Assessment & Plan: Ran out of amlodipine  and took his wife's BP medication:losartan 100 mg hydrochlorothiazide  25 in addition to his hydrochlorothiazide  25 mg.  Switch back to amlodipine  5,g and hydrochlorothiazide  25 mg   Orders: -     amLODIPine  Besylate; Take 1 tablet (10 mg total) by mouth daily.  Dispense: 90 tablet; Refill: 1 -     hydroCHLOROthiazide ; Take 1 tablet (25 mg total) by mouth daily.  Dispense: 90 tablet; Refill: 3  Lumbar radiculopathy, chronic -     Pregabalin ; Take 1 capsule (25 mg total) by mouth 2 (two) times daily.  Dispense: 60 capsule; Refill: 0  Mixed hyperlipidemia -     Rosuvastatin  Calcium ; Take 1 tablet (10 mg total) by mouth daily.  Dispense: 90 tablet; Refill: 3  Depression, major, single episode, moderate (HCC) Assessment & Plan: Discussed starting on SSRI for his depression.  He agrees to try this today.  Follow-up in a month for reassessment.  Orders: -     Sertraline  HCl; Take 1 tablet (25 mg total) by mouth daily.  Dispense: 30 tablet; Refill: 3     Return in about 4 weeks (around 08/06/2024).    Geffrey Michaelsen K Kia Stavros, MD

## 2024-07-09 NOTE — Assessment & Plan Note (Signed)
 Ran out of amlodipine  and took his wife's BP medication:losartan 100 mg hydrochlorothiazide  25 in addition to his hydrochlorothiazide  25 mg.  Switch back to amlodipine  5,g and hydrochlorothiazide  25 mg

## 2024-07-09 NOTE — Assessment & Plan Note (Signed)
 Discussed starting on SSRI for his depression.  He agrees to try this today.  Follow-up in a month for reassessment.

## 2024-07-09 NOTE — Assessment & Plan Note (Signed)
 Vestibular training is not working to relieve his dizziness.  Will obtrain a head CT without contrast.

## 2024-07-12 ENCOUNTER — Encounter

## 2024-07-19 ENCOUNTER — Encounter

## 2024-07-24 ENCOUNTER — Encounter

## 2024-07-25 ENCOUNTER — Ambulatory Visit
Admission: RE | Admit: 2024-07-25 | Discharge: 2024-07-25 | Disposition: A | Discharge status: Home / Self Care | Source: Ambulatory Visit | Attending: Family Medicine | Admitting: Family Medicine

## 2024-07-25 DIAGNOSIS — H814 Vertigo of central origin: Secondary | ICD-10-CM | POA: Diagnosis present

## 2024-07-26 ENCOUNTER — Encounter

## 2024-08-03 ENCOUNTER — Telehealth: Payer: Self-pay | Admitting: Family Medicine

## 2024-08-03 NOTE — Telephone Encounter (Signed)
 Advised thathis CT scan of his head was normal.

## 2024-08-06 ENCOUNTER — Ambulatory Visit (INDEPENDENT_AMBULATORY_CARE_PROVIDER_SITE_OTHER): Admitting: Family Medicine

## 2024-08-06 ENCOUNTER — Encounter: Payer: Self-pay | Admitting: Family Medicine

## 2024-08-06 VITALS — BP 130/85 | HR 63 | Temp 98.3°F | Resp 18 | Ht 66.0 in | Wt 205.0 lb

## 2024-08-06 DIAGNOSIS — F419 Anxiety disorder, unspecified: Secondary | ICD-10-CM

## 2024-08-06 DIAGNOSIS — I10 Essential (primary) hypertension: Secondary | ICD-10-CM | POA: Diagnosis not present

## 2024-08-06 DIAGNOSIS — M5416 Radiculopathy, lumbar region: Secondary | ICD-10-CM

## 2024-08-06 DIAGNOSIS — F32A Depression, unspecified: Secondary | ICD-10-CM

## 2024-08-06 NOTE — Progress Notes (Unsigned)
   Established Patient Office Visit  Subjective   Patient ID: Tyler Winters, male    DOB: 09-18-1966  Age: 58 y.o. MRN: [id number]  Chief Complaint  Patient presents with   Medical Management of Chronic Issues    HPI Discussed the use of AI scribe software for clinical note transcription with the patient, who gave verbal consent to proceed.  History of Present Illness   Tyler Winters is a 58 year old male with hypertension and spinal cord issues who presents for medication management and follow-up.  He is currently taking sertraline  25 mg for mood stabilization, which has improved his mood. He feels 'kind of happier' and experiences emotional responses such as crying during movies, which he finds unusual. He is unsure if the medication is strong enough but reports no dizziness.  He has a history of a concussion sustained three to four years ago after slipping in the snow and hitting his head, resulting in a brief loss of consciousness. Since then, he has experienced issues with his legs, and recalls that others thought he might have had a stroke, but he was told his CT scan of his head was normal.  He is on amlodipine  and hydrochlorothiazide  for hypertension management. He mentions frequent urination, which he attributes to the medication, and has increased his water intake to stay hydrated.  For neck and back pain, he takes Lyrica  (pregabalin ) twice a day. He reports intermittent numbness in his hand, particularly between the pinky and the next finger, and occasional leg tremors or spasms. He has adjusted his posture to alleviate symptoms and avoids activities like bowling due to balance issues.  He mentions swelling in his legs, which he associates with Lyrica , and expresses concern about potential diabetes, although he does not report any pain associated with the swelling.     ROS    Objective:     BP 130/85 (BP Location: Left Arm, Patient Position: Sitting, Cuff  Size: Normal)   Pulse 63   Temp 98.3 F (36.8 C) (Oral)   Resp 18   Ht 5' 6 (1.676 m)   Wt 205 lb (93 kg)   SpO2 96%   BMI 33.09 kg/m  {Vitals History (Optional):23777}  Physical Exam ***  {Perform Simple Foot Exam  Perform Detailed exam:1} {Insert foot Exam (Optional):30965}   No results found for any visits on 08/06/24.  {Labs (Optional):23779}  The 10-year ASCVD risk score (Arnett DK, et al., 2019) is: 17.7%    Assessment & Plan:  Primary hypertension -     CMP14+EGFR     No follow-ups on file.    Ladasha Schnackenberg K Dawnmarie Breon, MD

## 2024-08-07 ENCOUNTER — Ambulatory Visit: Payer: Self-pay | Admitting: Family Medicine

## 2024-08-07 LAB — CMP14+EGFR
ALT: 38 IU/L (ref 0–44)
AST: 23 IU/L (ref 0–40)
Albumin: 4.2 g/dL (ref 3.8–4.9)
Alkaline Phosphatase: 93 IU/L (ref 47–123)
BUN/Creatinine Ratio: 12 (ref 9–20)
BUN: 10 mg/dL (ref 6–24)
Bilirubin Total: <0.2 mg/dL (ref 0.0–1.2)
CO2: 23 mmol/L (ref 20–29)
Calcium: 8.9 mg/dL (ref 8.7–10.2)
Chloride: 104 mmol/L (ref 96–106)
Creatinine, Ser: 0.85 mg/dL (ref 0.76–1.27)
Globulin, Total: 2.6 g/dL (ref 1.5–4.5)
Glucose: 105 mg/dL — ABNORMAL HIGH (ref 70–99)
Potassium: 3.5 mmol/L (ref 3.5–5.2)
Sodium: 141 mmol/L (ref 134–144)
Total Protein: 6.8 g/dL (ref 6.0–8.5)
eGFR: 102 mL/min/1.73 (ref >59)

## 2024-08-16 ENCOUNTER — Other Ambulatory Visit: Payer: Self-pay

## 2024-08-16 DIAGNOSIS — M5416 Radiculopathy, lumbar region: Secondary | ICD-10-CM

## 2024-08-16 MED ORDER — PREGABALIN 25 MG PO CAPS: 25.0000 mg | ORAL_CAPSULE | Freq: Two times a day (BID) | ORAL | 0 refills | Status: DC

## 2024-08-16 NOTE — Telephone Encounter (Signed)
 Copied from CRM [id number]. Topic: Clinical - Prescription Issue >> Aug 15, 2024  5:12 PM Delon DASEN wrote: Reason for CRM: pregabalin  (LYRICA ) 25 MG capsule- med refill is not at pharmacy- patient has been out for a couple days now

## 2024-09-12 ENCOUNTER — Other Ambulatory Visit: Payer: Self-pay

## 2024-09-12 DIAGNOSIS — F321 Major depressive disorder, single episode, moderate: Secondary | ICD-10-CM

## 2024-09-12 MED ORDER — SERTRALINE HCL 25 MG PO TABS
25.0000 mg | ORAL_TABLET | Freq: Every day | ORAL | 0 refills | Status: AC
Start: 2024-09-12 — End: ?

## 2024-09-13 ENCOUNTER — Other Ambulatory Visit: Payer: Self-pay | Admitting: Family Medicine

## 2024-09-13 DIAGNOSIS — M5416 Radiculopathy, lumbar region: Secondary | ICD-10-CM

## 2024-10-04 ENCOUNTER — Other Ambulatory Visit: Payer: Self-pay | Admitting: Family Medicine

## 2024-10-04 DIAGNOSIS — M5416 Radiculopathy, lumbar region: Secondary | ICD-10-CM

## 2024-10-05 ENCOUNTER — Ambulatory Visit: Payer: Self-pay

## 2024-10-05 NOTE — Telephone Encounter (Signed)
 NT has made 3 attempts to call the patient without success. Messages have been left for the patient to call back. Routing to clinic.

## 2024-10-05 NOTE — Telephone Encounter (Signed)
 2nd attempt to contact patient-no answer. Voicemail left with patient to call back to Nurse triage. Will route to callbacks.

## 2024-10-05 NOTE — Telephone Encounter (Signed)
 1st attempt to contact patient. No answer. Voicemail left for patient to call back to Nurse Triage. Will route to call backs.   Copied from CRM #[id number]. Topic: Clinical - Medical Advice >> Oct 05, 2024  3:00 PM Shardie S wrote: Reason for CRM: Patient states he has been an experiencing an uncontrollable shaking in his legs for about a month. He denied having any other symptoms. He is requesting a callback from PCP.

## 2024-10-12 ENCOUNTER — Telehealth: Payer: Self-pay

## 2024-10-12 ENCOUNTER — Telehealth: Payer: Self-pay | Admitting: Family Medicine

## 2024-10-12 ENCOUNTER — Encounter: Payer: Self-pay | Admitting: Emergency Medicine

## 2024-10-12 ENCOUNTER — Ambulatory Visit: Admission: EM | Admit: 2024-10-12 | Discharge: 2024-10-12 | Disposition: A | Discharge status: Home / Self Care

## 2024-10-12 DIAGNOSIS — M545 Low back pain, unspecified: Secondary | ICD-10-CM | POA: Diagnosis not present

## 2024-10-12 DIAGNOSIS — M5416 Radiculopathy, lumbar region: Secondary | ICD-10-CM | POA: Diagnosis not present

## 2024-10-12 DIAGNOSIS — G8929 Other chronic pain: Secondary | ICD-10-CM | POA: Diagnosis not present

## 2024-10-12 MED ORDER — PREGABALIN 25 MG PO CAPS: 25.0000 mg | ORAL_CAPSULE | Freq: Two times a day (BID) | ORAL | 0 refills | Status: AC

## 2024-10-12 MED ORDER — SERTRALINE HCL 25 MG PO TABS: 25.0000 mg | ORAL_TABLET | Freq: Every day | ORAL | 3 refills | Status: AC

## 2024-10-12 NOTE — Telephone Encounter (Signed)
 Patient presented in office to request refills for Pregabalin  and Sertraline . Per request of Dr. Ziglar, patient was placed in an exam room.

## 2024-10-12 NOTE — Addendum Note (Signed)
 Addended by: Reka Wist K on: 10/12/2024 11:42 AM   Modules accepted: Orders

## 2024-10-12 NOTE — Discharge Instructions (Addendum)
-   I have advised to keep PCP follow-up but request refill of your Lyrica  to get through to your appointment and subsequent refills if helpful. -In addition to the Lyrica  you can also take Tylenol . - May use warm compresses, heat, ice. - Speak to your PCP about a referral to orthopedic surgery or spine specialist.  You should have an MRI of your spine performed and also discuss nerve conduction test.   BACK PAIN RED FLAGS: If the back pain acutely worsens or there are any red flag symptoms such as numbness/tingling, increased leg weakness, saddle anesthesia, or loss of bowel/bladder control, go immediately to the ER. Follow up with us  as scheduled or sooner if the pain does not begin to resolve or if it worsens before the follow up

## 2024-10-12 NOTE — ED Triage Notes (Signed)
 Patient states yesterday, he was on his knees putting sodas in the fridge and when he went to stand felt pain in his lower back and caused him to fall.  Patient states that he is out of his back pain medicine.

## 2024-10-12 NOTE — ED Provider Notes (Signed)
 [facility]    CSN: [id number] Arrival date & time: 10/12/24  1024      History   Chief Complaint Chief Complaint  Patient presents with   Back Pain   Fall    HPI Tyler Winters is a 58 y.o. male with history of chronic low back pain and lumbar radiculopathy with weakness of the left lower extremity.  Patient reports lumbar radiculopathy and left leg weakness started a few years ago after he had a cervical spinal surgery.  He follows up with his PCP about this.  He says it has been a while since he has seen a spine specialist or orthopedics.  He has been on Lyrica  25 mg twice daily and says that helps him a lot.  However, he reports being out of the medication for the past 2 weeks and has not received a refill from his PCP.  His next PCP appointment is in 3 weeks.  He states he has intermittent sharp pains in his lower back and down the left leg which caused him to be paralyzed and not be able to move.  He experienced 1 of these episodes yesterday which caused him to fall to the ground.  He denies any saddle anesthesia, loss of bowel or bladder control.  HPI  Past Medical History:  Diagnosis Date   Allergy    Arthritis    Hypertension     Patient Active Problem List   Diagnosis Date Noted   Vertigo of central origin 07/09/2024   Depression, major, single episode, moderate (HCC) 07/09/2024   Benign positional vertigo 06/06/2024   Hyperlipidemia, mixed 03/27/2024   Healthcare maintenance 03/27/2024   Lumbar radiculopathy, chronic 01/18/2024   Anxiety and depression 01/18/2024   Hypertension 01/18/2024   Left leg weakness 01/09/2021   Nicotine dependence 01/09/2021   Weakness of right upper extremity 01/18/2018   White coat syndrome without hypertension 12/22/2017   Status post cervical spinal fusion 12/22/2017   Cigarette nicotine dependence without complication 12/22/2017    Past Surgical History:  Procedure Laterality Date   COLON SURGERY      SPINE SURGERY         Home Medications    Prior to Admission medications   Medication Sig Start Date End Date Taking? Authorizing Provider  amLODipine  (NORVASC ) 10 MG tablet Take 1 tablet (10 mg total) by mouth daily. 07/09/24   Ziglar, Susan K, MD  hydrochlorothiazide  (HYDRODIURIL ) 25 MG tablet Take 1 tablet (25 mg total) by mouth daily. 07/09/24   Ziglar, Susan K, MD  pregabalin  (LYRICA ) 25 MG capsule Take 1 capsule (25 mg total) by mouth 2 (two) times daily. 08/16/24   Ziglar, Susan K, MD  rosuvastatin  (CRESTOR ) 10 MG tablet Take 1 tablet (10 mg total) by mouth daily. 07/09/24   Ziglar, Susan K, MD  sertraline  (ZOLOFT ) 25 MG tablet Take 1 tablet (25 mg total) by mouth daily. 09/12/24   Ziglar, Devere POUR, MD    Family History Family History  Problem Relation Age of Onset   Hypertension Mother    Hypertension Father     Social History Social History   Tobacco Use   Smoking status: Every Day    Types: Cigarettes    Passive exposure: Never   Smokeless tobacco: Never  Vaping Use   Vaping status: Never Used  Substance Use Topics   Alcohol use: Yes    Comment: 1 pack every 2 weeks   Drug use: Never     Allergies  Morphine and codeine and Morphine   Review of Systems Review of Systems  Musculoskeletal:  Positive for arthralgias, back pain and gait problem (walks with cane).  Neurological:  Positive for weakness and numbness.     Physical Exam Triage Vital Signs ED Triage Vitals  Encounter Vitals Group     BP 10/12/24 1038 (!) 148/97     Girls Systolic BP Percentile --      Girls Diastolic BP Percentile --      Boys Systolic BP Percentile --      Boys Diastolic BP Percentile --      Pulse Rate 10/12/24 1038 72     Resp 10/12/24 1038 15     Temp 10/12/24 1038 98.1 F (36.7 C)     Temp Source 10/12/24 1038 Oral     SpO2 10/12/24 1038 95 %     Weight 10/12/24 1038 205 lb 0.4 oz (93 kg)     Height 10/12/24 1038 5' 6 (1.676 m)     Head Circumference --      Peak  Flow --      Pain Score 10/12/24 1037 4     Pain Loc --      Pain Education --      Exclude from Growth Chart --    No data found.  Updated Vital Signs BP (!) 148/97 (BP Location: Right Arm)   Pulse 72   Temp 98.1 F (36.7 C) (Oral)   Resp 15   Ht 5' 6 (1.676 m)   Wt 205 lb 0.4 oz (93 kg)   SpO2 95%   BMI 33.09 kg/m      Physical Exam Vitals and nursing note reviewed.  Constitutional:      General: He is not in acute distress.    Appearance: Normal appearance. He is well-developed. He is not ill-appearing.  HENT:     Head: Normocephalic and atraumatic.  Eyes:     General: No scleral icterus.    Conjunctiva/sclera: Conjunctivae normal.  Cardiovascular:     Rate and Rhythm: Normal rate and regular rhythm.  Pulmonary:     Effort: Pulmonary effort is normal. No respiratory distress.  Abdominal:     Tenderness: There is abdominal tenderness.  Musculoskeletal:     Cervical back: Neck supple.     Lumbar back: Bony tenderness (L4-L5, L5-S1) present. Decreased range of motion.     Comments: Weakness of left leg  Skin:    General: Skin is warm and dry.     Capillary Refill: Capillary refill takes less than 2 seconds.  Neurological:     General: No focal deficit present.     Mental Status: He is alert. Mental status is at baseline.     Gait: Gait abnormal (walks with cane).  Psychiatric:        Mood and Affect: Mood normal.        Behavior: Behavior normal.      UC Treatments / Results  Labs (all labs ordered are listed, but only abnormal results are displayed) Labs Reviewed - No data to display  EKG   Radiology No results found. Study Result  Narrative & Impression  CLINICAL DATA:  Left leg weakness following a fall with loss of consciousness approximately 4 weeks prior. Unsteady gait. Evaluate for cord compression and cauda equina syndrome.   EXAM: MRI CERVICAL, THORACIC AND LUMBAR SPINE WITHOUT CONTRAST   TECHNIQUE: Multiplanar and multiecho pulse  sequences of the cervical spine, to include the craniocervical junction  and cervicothoracic junction, and thoracic and lumbar spine, were obtained without intravenous contrast.   COMPARISON:  CTs of the cervical, thoracic and lumbar spine same date.   FINDINGS: MRI CERVICAL SPINE FINDINGS   Alignment: Straightening without focal angulation or listhesis.   Vertebrae: No evidence of acute fracture or focal osseous lesion. Previous anterior discectomy and fusion at C5-6 with solid interbody fusion as correlated with preceding CT.   Cord: There are symmetric foci of T2 hyperintensity bilaterally in the cord at C5, best seen on axial image 15/4. There is no focal cord expansion or atrophy at this level. No other cord signal abnormalities are identified. 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   Posterior Fossa, vertebral arteries, paraspinal tissues: The craniocervical junction appears normal. There are bilateral vertebral artery flow voids. No paraspinal abnormalities.   Disc levels:   C2-3: Mild disc bulging and uncinate spurring. Mild foraminal narrowing bilaterally. No cord deformity.   C3-4: Loss of disc height with disc bulging, uncinate spurring and facet hypertrophy bilaterally. Moderate biforaminal narrowing. No cord deformity.   C4-5: Loss of disc height with disc bulging, uncinate spurring and bilateral facet hypertrophy. The CSF surrounding the cord is effaced with narrowing the AP diameter of the canal to 7 mm. As above, just below the disc space, there is symmetric T2 hyperintensity in the cord bilaterally. Moderate to severe foraminal narrowing bilaterally.   C5-6: Status post anterior discectomy and fusion. Residual uncinate spurring contributes to moderate foraminal narrowing bilaterally. No cord deformity.   C6-7: Loss of disc height with disc bulging, uncinate spurring and bilateral facet  hypertrophy. Moderate to severe osseous foraminal narrowing bilaterally. No cord deformity.   C7-T1: No significant findings.   MRI THORACIC SPINE FINDINGS   Alignment:  Physiologic.   Vertebrae: No acute or suspicious osseous findings.   Cord:  The thoracic cord appears normal in signal and caliber.   Paraspinal and other soft tissues: No significant paraspinal abnormalities.   Disc levels:   Mild thoracic spine degenerative changes. There is a small central disc protrusion at T3-4 without cord deformity. There is a small left paracentral disc protrusion at T4-5. No other significant disc space findings are present. The neural foramina appear patent. No cord deformity.   MRI LUMBAR SPINE FINDINGS   Segmentation: There are 5 lumbar type vertebral bodies.   Alignment:  Straightening without focal angulation or listhesis.   Vertebrae: No worrisome osseous lesion, acute fracture or pars defect. The visualized sacroiliac joints appear unremarkable.   Conus medullaris: Extends to the L1 level and appears normal.   Paraspinal and other soft tissues: No significant paraspinal findings.   Disc levels:   No significant disc space findings from T12-L1 through L2-3.   L3-4: Mild disc bulging and mild right foraminal narrowing. No nerve root encroachment.   L4-5: Loss of disc height with disc desiccation and vacuum phenomenon as correlated with prior CT. There is annular disc bulging and a moderate-sized left paracentral disc protrusion. There is mass effect on the thecal sac and left lateral recess with possible left L5 nerve root encroachment. The right lateral recess and both foramina are mildly narrowed.   L5-S1: Disc height and hydration are maintained. Mild bilateral facet hypertrophy without resulting spinal stenosis or nerve root encroachment.   IMPRESSION: 1. No acute osseous or ligamentous findings in the spine. No clear explanation for the patient's  symptoms. 2.  3. Multilevel cervical spondylosis post anterior discectomy and fusion at C5-6. Mild spinal stenosis at C4-5. Multilevel cervical foraminal narrowing as detailed above. 4. Mild thoracic spine degenerative changes with small disc protrusions at T3-4 and T4-5. No cord deformity or foraminal compromise. 5. Moderate-sized left paracentral disc protrusion at L4-5 with mass effect on the thecal sac and possible left L5 nerve root encroachment.     Electronically Signed   By: Elsie Perone M.D.   On: 01/09/2021 10:32     Procedures Procedures (including critical care time)  Medications Ordered in UC Medications - No data to display  Initial Impression / Assessment and Plan / UC Course  I have reviewed the triage vital signs and the nursing notes.  Pertinent labs & imaging results that were available during my care of the patient were reviewed by me and considered in my medical decision making (see chart for details).   58 year old male with history of chronic low back pain with lumbar radiculopathy and weakness of the left leg presents for acute flare of chronic underlying pain over the past several days.  He  has been off Lyrica  25 mg twice daily for the past 2 weeks.  He requested a refill from primary care provider but has not received it.  See HPI.  Included MRI results from 2022 above.  Chronic lumbar radiculopathy.  Advised patient to follow-up with PCP as scheduled on 11/05/2024.  He plans to go by the clinic at this time to request the refill of Lyrica  to get him through to his appointment.  We discussed asking for referral to get MRI of his spine and also discussed nerve conduction test.  May also benefit from an ESI, physical therapy, excetra.  Reviewed ED precautions.  Patient declines an AVS and will review records on MyChart.   Final Clinical Impressions(s) / UC Diagnoses   Final diagnoses:  Chronic midline low back pain, unspecified whether sciatica present  Lumbar radiculopathy     Discharge Instructions      - I have advised to keep PCP follow-up but request refill of your Lyrica  to get through to your appointment and subsequent refills if helpful. -In addition to the Lyrica  you can also take Tylenol . - May use warm compresses, heat, ice. - Speak to your PCP about a referral to orthopedic surgery or spine specialist.  You should have an MRI of your spine performed and also discuss nerve conduction test.   BACK PAIN RED FLAGS: If the back pain acutely worsens or there are any red flag symptoms such as numbness/tingling, increased leg weakness, saddle anesthesia, or loss of bowel/bladder control, go immediately to the ER. Follow up with us  as scheduled or sooner if the pain does not begin to resolve or if it worsens before the follow up       ED Prescriptions   None    I have reviewed the PDMP during this encounter.   Arvis Jolan NOVAK, PA-C 10/12/24 1739

## 2024-10-28 ENCOUNTER — Ambulatory Visit
Admission: EM | Admit: 2024-10-28 | Discharge: 2024-10-28 | Disposition: A | Discharge status: Home / Self Care | Attending: Emergency Medicine | Admitting: Emergency Medicine

## 2024-10-28 ENCOUNTER — Ambulatory Visit

## 2024-10-28 ENCOUNTER — Encounter: Payer: Self-pay | Admitting: Emergency Medicine

## 2024-10-28 DIAGNOSIS — J069 Acute upper respiratory infection, unspecified: Secondary | ICD-10-CM

## 2024-10-28 DIAGNOSIS — R0789 Other chest pain: Secondary | ICD-10-CM

## 2024-10-28 DIAGNOSIS — R051 Acute cough: Secondary | ICD-10-CM

## 2024-10-28 LAB — POC COVID19/FLU A&B COMBO
Covid Antigen, POC: NEGATIVE
Influenza A Antigen, POC: NEGATIVE
Influenza B Antigen, POC: NEGATIVE

## 2024-10-28 MED ORDER — PROMETHAZINE-DM 6.25-15 MG/5ML PO SYRP: 5.0000 mL | ORAL_SOLUTION | Freq: Four times a day (QID) | ORAL | 0 refills | Status: AC | PRN

## 2024-10-28 MED ORDER — HYDROCODONE-ACETAMINOPHEN 5-325 MG PO TABS: 1.0000 | ORAL_TABLET | Freq: Four times a day (QID) | ORAL | 0 refills | Status: AC | PRN

## 2024-10-28 MED ORDER — IBUPROFEN 600 MG PO TABS: 600.0000 mg | ORAL_TABLET | Freq: Four times a day (QID) | ORAL | 0 refills | Status: AC | PRN

## 2024-10-28 MED ORDER — FLUTICASONE PROPIONATE 50 MCG/ACT NA SUSP: 2.0000 | Freq: Every day | NASAL | 0 refills | Status: AC

## 2024-10-28 NOTE — ED Triage Notes (Signed)
 Pt c/o left lower abdomen and flank pain x1day  Pt states that he has had a cold for the last 2 days and is now coughing and sneezing. Whenever he sneezes, he has intense lower abdominal pain  Pt states that he can touch his abdomen and has a sharp pain  Pt denies history of sickle cell, diabetes, diverticulitis, or GERD  Pt states that the pain does not move across his abdomen or back, but stays just under the left side ribs.  Pt states that he has had weight gain.  Pt describes the pain as a pressure and that he has no room in his stomach and when he coughs, the area will bust due to pressure

## 2024-10-28 NOTE — ED Provider Notes (Signed)
 HPI  SUBJECTIVE:  Tyler Winters is a 58 y.o. male who presents with the acute onset of sharp, intermittent left lower anterior rib pain after coughing hard last night.  He has had an upper respiratory infection for the past 2 days, nasal congestion, rhinorrhea, cough, postnasal drip.  He had a sore throat, but this has resolved.  He states that the rib pain is worse with coughing, sneezing, laughing, torso rotation/movement.  Symptoms are better when he sits still.  He has tried NyQuil for his symptoms without improvement.  No trauma to the chest, wheezing, shortness of breath, fevers, abdominal pain or distention.  No body aches, headaches, sinus pain or pressure.  No known COVID or flu exposure.  He got 5 doses of COVID-vaccine and this year's flu vaccine.  He has a past medical history of hypertension, hypercholesterolemia and continues to smoke.  States that he has itching with morphine, but tolerates hydrocodone .  PCP: [facility].    Past Medical History:  Diagnosis Date   Allergy    Arthritis    Hypertension     Past Surgical History:  Procedure Laterality Date   COLON SURGERY     SPINE SURGERY      Family History  Problem Relation Age of Onset   Hypertension Mother    Hypertension Father     Social History   Tobacco Use   Smoking status: Every Day    Types: Cigarettes    Passive exposure: Never   Smokeless tobacco: Never  Vaping Use   Vaping status: Never Used  Substance Use Topics   Alcohol use: Yes    Comment: 1 pack every 2 weeks   Drug use: Never    No current facility-administered medications for this encounter.  Current Outpatient Medications:    amLODipine  (NORVASC ) 10 MG tablet, Take 1 tablet (10 mg total) by mouth daily., Disp: 90 tablet, Rfl: 1   fluticasone  (FLONASE ) 50 MCG/ACT nasal spray, Place 2 sprays into both nostrils daily., Disp: 16 g, Rfl: 0   hydrochlorothiazide  (HYDRODIURIL ) 25 MG tablet, Take 1 tablet (25 mg total) by mouth  daily., Disp: 90 tablet, Rfl: 3   HYDROcodone -acetaminophen  (NORCO/VICODIN) 5-325 MG tablet, Take 1-2 tablets by mouth every 6 (six) hours as needed for moderate pain (pain score 4-6) or severe pain (pain score 7-10)., Disp: 12 tablet, Rfl: 0   ibuprofen  (ADVIL ) 600 MG tablet, Take 1 tablet (600 mg total) by mouth every 6 (six) hours as needed for up to 7 days., Disp: 28 tablet, Rfl: 0   pregabalin  (LYRICA ) 25 MG capsule, Take 1 capsule (25 mg total) by mouth 2 (two) times daily., Disp: 60 capsule, Rfl: 0   pregabalin  (LYRICA ) 25 MG capsule, Take 1 capsule (25 mg total) by mouth 2 (two) times daily., Disp: 60 capsule, Rfl: 0   promethazine -dextromethorphan (PROMETHAZINE -DM) 6.25-15 MG/5ML syrup, Take 5 mLs by mouth 4 (four) times daily as needed for cough., Disp: 118 mL, Rfl: 0   rosuvastatin  (CRESTOR ) 10 MG tablet, Take 1 tablet (10 mg total) by mouth daily., Disp: 90 tablet, Rfl: 3   sertraline  (ZOLOFT ) 25 MG tablet, Take 1 tablet (25 mg total) by mouth daily., Disp: 90 tablet, Rfl: 0   sertraline  (ZOLOFT ) 25 MG tablet, Take 1 tablet (25 mg total) by mouth daily., Disp: 30 tablet, Rfl: 3  Allergies  Allergen Reactions   Morphine And Codeine Itching   Morphine Itching     ROS  As noted in HPI.  Physical Exam  BP 133/85 (BP Location: Left Arm)   Pulse 73   Wt 94.3 kg   SpO2 95%   BMI 33.54 kg/m   Constitutional: Well developed, well nourished, no acute distress Eyes:  EOMI, conjunctiva normal bilaterally HENT: Normocephalic, atraumatic,mucus membranes moist.  Positive nasal congestion.  No maxillary, frontal sinus tenderness.  Normal oropharynx.  No obvious postnasal drip. Neck: No cervical lymphadenopathy Respiratory: Poor inspiratory effort due to pain.  Normal appearance of the chest.  Point tenderness along left ribs 7/8 anteriorly.  No other rib tenderness.  No other chest wall tenderness.  No paradoxical chest wall motion. Cardiovascular: Normal rate, regular rhythm, no  murmurs rubs or gallops GI: nondistended soft, nontender, active bowel sounds.  No rebound, guarding. skin: No rash, skin intact Musculoskeletal: no deformities Neurologic: Alert & oriented x 3, no focal neuro deficits Psychiatric: Speech and behavior appropriate   ED Course   Medications - No data to display  Orders Placed This Encounter  Procedures   DG Ribs Unilateral W/Chest Left    Standing Status:   Standing    Number of Occurrences:   1    Reason for Exam (SYMPTOM  OR DIAGNOSIS REQUIRED):   Flank Pain   POC Covid19/Flu A&B Antigen    Standing Status:   Standing    Number of Occurrences:   1    Results for orders placed or performed during the hospital encounter of 10/28/24 (from the past 24 hours)  POC Covid19/Flu A&B Antigen     Status: Normal   Collection Time: 10/28/24  2:32 PM  Result Value Ref Range   Influenza A Antigen, POC Negative Negative   Influenza B Antigen, POC Negative Negative   Covid Antigen, POC Negative Negative   DG Ribs Unilateral W/Chest Left Result Date: 10/28/2024 CLINICAL DATA:  Left abdominal and flank pain for 1 day, cough EXAM: LEFT RIBS AND CHEST - 3+ VIEW COMPARISON:  None Available. FINDINGS: Frontal view of the chest as well as frontal and oblique views of the left thoracic cage are obtained on 5 images. The cardiac silhouette is unremarkable. No acute airspace disease, effusion, or pneumothorax. There are no acute displaced fractures. IMPRESSION: 1. No acute intrathoracic process. 2. No acute displaced fracture. Electronically Signed   By: Ozell Daring M.D.   On: 10/28/2024 14:27    ED Clinical Impression  1. Rib pain on left side   2. Acute cough   3. Acute upper respiratory infection      ED Assessment/Plan    Offered pain medication, but patient declined.  [place]  narcotic database reviewed.  Feel it is appropriate to proceed with a prescription for controlled substance.  No narcotic prescriptions in the past 2  years.  I suspect a cracked rib from coughing.  Doubt intra-abdominal process.  Discussed with patient that we have available here plain films and that no rib fractures show up on plain films and offered transfer to the emergency department for comprehensive workup if he was concerned about any other etiologies other than rib fracture.  He has decided to stay here.  Will check left rib series, COVID and flu.  Reviewed imaging independently.  No acute displaced fractures consistent with my read.  Will contact patient at if the radiology overread differs enough from mine and we need to change management.  See radiology report for full details.  COVID, flu negative.  Discussed with patient while in department.  Clinically, I am concerned for nondisplaced rib  fracture.  Will send home with ibuprofen  and a Tylenol  containing product 3-4 times a day as needed for pain.  Mucinex , saline nasal irrigation, Flonase , Promethazine  DM for cough.  Discussed labs, imaging, MDM, treatment plan, and plan for follow-up with patient. Discussed sn/sx that should prompt return to the ED. patient agrees with plan.   Meds ordered this encounter  Medications   ibuprofen  (ADVIL ) 600 MG tablet    Sig: Take 1 tablet (600 mg total) by mouth every 6 (six) hours as needed for up to 7 days.    Dispense:  28 tablet    Refill:  0   HYDROcodone -acetaminophen  (NORCO/VICODIN) 5-325 MG tablet    Sig: Take 1-2 tablets by mouth every 6 (six) hours as needed for moderate pain (pain score 4-6) or severe pain (pain score 7-10).    Dispense:  12 tablet    Refill:  0   promethazine -dextromethorphan (PROMETHAZINE -DM) 6.25-15 MG/5ML syrup    Sig: Take 5 mLs by mouth 4 (four) times daily as needed for cough.    Dispense:  118 mL    Refill:  0   fluticasone  (FLONASE ) 50 MCG/ACT nasal spray    Sig: Place 2 sprays into both nostrils daily.    Dispense:  16 g    Refill:  0      *This clinic note was created using [employer]. Therefore, there may be occasional mistakes despite careful proofreading.  ?    Van Knee, MD 10/28/24 1442

## 2024-10-28 NOTE — Discharge Instructions (Signed)
 COVID/flu negative.  No displaced rib fracture on x-ray, however, not all rib fractures show up on x-ray.  I am going to treat you as if this is a cracked rib.  600 mg of ibuprofen  and a Tylenol  containing product 3-4 times a day as needed for pain.  Either 1000 mg of plain Tylenol  for mild to moderate pain or 1-2 Norco for severe pain.  Get an incentive spirometer from a medical supply store and uses 3-4 times an hour.  Plain Mucinex , saline nasal irrigation with a [employer] sinus rinse and distilled water as often as you want, Flonase  for nasal congestion and postnasal drip, Promethazine  DM for cough.

## 2024-11-05 ENCOUNTER — Encounter: Payer: Self-pay | Admitting: Family Medicine

## 2024-11-05 ENCOUNTER — Ambulatory Visit (INDEPENDENT_AMBULATORY_CARE_PROVIDER_SITE_OTHER): Admitting: Family Medicine

## 2024-11-05 VITALS — BP 126/86 | HR 85 | Temp 98.5°F | Ht 66.0 in | Wt 208.1 lb

## 2024-11-05 DIAGNOSIS — M5416 Radiculopathy, lumbar region: Secondary | ICD-10-CM

## 2024-11-05 DIAGNOSIS — I10 Essential (primary) hypertension: Secondary | ICD-10-CM | POA: Diagnosis not present

## 2024-11-05 NOTE — Assessment & Plan Note (Signed)
 BP much better controlled today 123/86.  He is compliant with amlodipine  and hydrochlorothiazide .

## 2024-11-05 NOTE — Progress Notes (Signed)
 Established Patient Office Visit  Subjective   Patient ID: Tyler Winters, male    DOB: 1966/07/12  Age: 58 y.o. MRN: [id number]  Chief Complaint  Patient presents with   Follow-up    HPI Tyler Winters 58 year old gentleman with HTN and chronic myelopathy left leg, lumbar radiculopathy left leg, mood disorder, dizziness (08/02/2024 CT of his head was normal).  Discussed the use of AI scribe software for clinical note transcription with the patient, who gave verbal consent to proceed.  History of Present Illness   Tyler Winters is a 58 year old male who presents with dizziness and ear issues.  He experiences ongoing dizziness, which significantly impacts his daily activities, including playing games. His wife is concerned about his reduced ability to engage in these activities. To occupy his time, he engages in activities such as playing with turtles, fish, and cooking.  He stopped going to PT when he developed dizziness.  He describes ear issues, particularly noting that water gets trapped in his ears when he cleans them, causing discomfort, especially when yawning or if tears run down his face. He tries to avoid water exposure to his ears and sometimes uses earplugs to prevent water entry.  He recently bruised a rib after a coughing and sneezing episode due to a cold about a week ago. He was prescribed medication for pain and cough, avoiding morphine due to an allergy. He manages his symptoms by drinking hot liquids and maintaining his home temperature at 76 degrees to prevent getting cold.  He is concerned about weight gain, recalling a previous weight of 200 pounds. He attributes some of the weight gain to cooking more frequently.  He is on medication for hypertension.  Due to his health issues, he is unable to work and requires a note to confirm this for food stamp eligibility.    Tyler Winters is a 58 year old male who presents with dizziness and ear  issues.  He experiences ongoing dizziness, which significantly impacts his daily activities, including playing games. His wife is concerned about his reduced ability to engage in these activities. To occupy his time, he engages in activities such as playing with turtles, fish, and cooking.  He describes ear issues, particularly noting that water gets trapped in his ears when he cleans them, causing discomfort, especially when yawning or if tears run down his face. He tries to avoid water exposure to his ears and sometimes uses earplugs to prevent water entry.  He recently bruised a rib after a coughing and sneezing episode due to a cold about a week ago. He was prescribed medication for pain and cough, avoiding morphine due to an allergy. He manages his symptoms by drinking hot liquids and maintaining his home temperature at 76 degrees to prevent getting cold.  He is concerned about weight gain, recalling a previous weight of 200 pounds. He attributes some of the weight gain to cooking more frequently.  He is on medication for hypertension.  Due to his health issues, he is unable to work and requires a note to confirm this for food stamp eligibility.    Objective:     BP 126/86   Pulse 85   Temp 98.5 F (36.9 C) (Oral)   Ht 5' 6 (1.676 m)   Wt 208 lb 2 oz (94.4 kg)   SpO2 95%   BMI 33.59 kg/m    Physical Exam Vitals and nursing note reviewed.  Constitutional:      Appearance: Normal  appearance.  HENT:     Head: Normocephalic and atraumatic.  Eyes:     Conjunctiva/sclera: Conjunctivae normal.  Cardiovascular:     Rate and Rhythm: Normal rate and regular rhythm.  Pulmonary:     Effort: Pulmonary effort is normal.     Breath sounds: Normal breath sounds.  Musculoskeletal:     Right lower leg: No edema.     Left lower leg: No edema.  Skin:    General: Skin is warm and dry.  Neurological:     Mental Status: He is alert and oriented to person, place, and time.     Comments:  Knee flexor 5/5 bilaterally, knee extensor 5/5 bilaterally.  Psychiatric:        Mood and Affect: Mood normal.        Behavior: Behavior normal.        Thought Content: Thought content normal.        Judgment: Judgment normal.          No results found for any visits on 11/05/24.    The 10-year ASCVD risk score (Arnett DK, et al., 2019) is: 16.7%    Assessment & Plan:  Lumbar radiculopathy, chronic Assessment & Plan: Has chronic left radiculopathy.  Takes Lyrica  with some relief of pain.  Continue Lyrica  twice daily.  Ask him to get back into physical therapy now that his dizziness has improved.  Will send new referral to PT in [place].   Primary hypertension Assessment & Plan: BP much better controlled today 123/86.  He is compliant with amlodipine  and hydrochlorothiazide .      Return in about 3 months (around 02/03/2025).    Sagrario Lineberry K Hafsa Lohn, MD

## 2024-11-05 NOTE — Assessment & Plan Note (Addendum)
 Has chronic left radiculopathy.  Takes Lyrica  with some relief of pain.  Continue Lyrica  twice daily.  Ask him to get back into physical therapy now that his dizziness has improved.  Will send new referral to PT in [place].

## 2024-11-07 NOTE — Telephone Encounter (Signed)
No additional message

## 2025-02-04 ENCOUNTER — Ambulatory Visit: Admitting: Family Medicine
